# Patient Record
Sex: Female | Born: 1935 | Race: White | Hispanic: No | Smoking: Never smoker
Health system: Southern US, Community
[De-identification: ages and names within clinical notes are randomized; demographics above are authoritative.]

## PROBLEM LIST (undated history)

## (undated) DIAGNOSIS — Z932 Ileostomy status: Secondary | ICD-10-CM

## (undated) DIAGNOSIS — K219 Gastro-esophageal reflux disease without esophagitis: Secondary | ICD-10-CM

## (undated) DIAGNOSIS — I10 Essential (primary) hypertension: Secondary | ICD-10-CM

## (undated) DIAGNOSIS — C801 Malignant (primary) neoplasm, unspecified: Secondary | ICD-10-CM

## (undated) DIAGNOSIS — E213 Hyperparathyroidism, unspecified: Secondary | ICD-10-CM

## (undated) DIAGNOSIS — G510 Bell's palsy: Secondary | ICD-10-CM

## (undated) DIAGNOSIS — D649 Anemia, unspecified: Secondary | ICD-10-CM

## (undated) DIAGNOSIS — J189 Pneumonia, unspecified organism: Secondary | ICD-10-CM

## (undated) DIAGNOSIS — F419 Anxiety disorder, unspecified: Secondary | ICD-10-CM

## (undated) DIAGNOSIS — M810 Age-related osteoporosis without current pathological fracture: Secondary | ICD-10-CM

## (undated) HISTORY — PX: ABDOMINAL HYSTERECTOMY: SHX81

## (undated) HISTORY — PX: OTHER SURGICAL HISTORY: SHX169

## (undated) HISTORY — PX: BREAST EXCISIONAL BIOPSY: SUR124

---

## 1998-04-02 ENCOUNTER — Ambulatory Visit (HOSPITAL_COMMUNITY): Admission: RE | Admit: 1998-04-02 | Discharge: 1998-04-02 | Payer: Self-pay | Admitting: Family Medicine

## 1998-10-14 ENCOUNTER — Other Ambulatory Visit: Admission: RE | Admit: 1998-10-14 | Discharge: 1998-10-14 | Payer: Self-pay | Admitting: Obstetrics and Gynecology

## 1999-03-06 ENCOUNTER — Encounter: Admission: RE | Admit: 1999-03-06 | Discharge: 1999-03-06 | Payer: Self-pay | Admitting: *Deleted

## 1999-03-06 ENCOUNTER — Encounter: Payer: Self-pay | Admitting: *Deleted

## 1999-03-25 ENCOUNTER — Encounter: Admission: RE | Admit: 1999-03-25 | Discharge: 1999-03-25 | Payer: Self-pay | Admitting: *Deleted

## 1999-03-25 ENCOUNTER — Encounter: Payer: Self-pay | Admitting: *Deleted

## 1999-04-29 ENCOUNTER — Ambulatory Visit (HOSPITAL_COMMUNITY): Admission: RE | Admit: 1999-04-29 | Discharge: 1999-04-29 | Payer: Self-pay | Admitting: *Deleted

## 2000-05-24 ENCOUNTER — Encounter: Payer: Self-pay | Admitting: Obstetrics and Gynecology

## 2000-05-24 ENCOUNTER — Ambulatory Visit (HOSPITAL_COMMUNITY): Admission: RE | Admit: 2000-05-24 | Discharge: 2000-05-24 | Payer: Self-pay | Admitting: Obstetrics and Gynecology

## 2000-09-29 ENCOUNTER — Encounter (INDEPENDENT_AMBULATORY_CARE_PROVIDER_SITE_OTHER): Payer: Self-pay | Admitting: Specialist

## 2000-09-29 ENCOUNTER — Other Ambulatory Visit: Admission: RE | Admit: 2000-09-29 | Discharge: 2000-09-29 | Payer: Self-pay | Admitting: Gastroenterology

## 2001-06-22 ENCOUNTER — Encounter: Payer: Self-pay | Admitting: Obstetrics and Gynecology

## 2001-06-22 ENCOUNTER — Ambulatory Visit (HOSPITAL_COMMUNITY): Admission: RE | Admit: 2001-06-22 | Discharge: 2001-06-22 | Payer: Self-pay | Admitting: Obstetrics and Gynecology

## 2002-06-22 ENCOUNTER — Ambulatory Visit (HOSPITAL_COMMUNITY): Admission: RE | Admit: 2002-06-22 | Discharge: 2002-06-22 | Payer: Self-pay | Admitting: *Deleted

## 2002-06-22 ENCOUNTER — Encounter: Payer: Self-pay | Admitting: Obstetrics and Gynecology

## 2003-07-15 ENCOUNTER — Ambulatory Visit (HOSPITAL_COMMUNITY): Admission: RE | Admit: 2003-07-15 | Discharge: 2003-07-15 | Payer: Self-pay | Admitting: Geriatric Medicine

## 2004-02-13 ENCOUNTER — Ambulatory Visit: Payer: Self-pay | Admitting: Gastroenterology

## 2004-07-30 ENCOUNTER — Ambulatory Visit (HOSPITAL_COMMUNITY): Admission: RE | Admit: 2004-07-30 | Discharge: 2004-07-30 | Payer: Self-pay | Admitting: Geriatric Medicine

## 2004-07-30 ENCOUNTER — Ambulatory Visit: Payer: Self-pay | Admitting: Gastroenterology

## 2004-08-27 ENCOUNTER — Ambulatory Visit: Payer: Self-pay | Admitting: Gastroenterology

## 2004-09-25 ENCOUNTER — Inpatient Hospital Stay (HOSPITAL_COMMUNITY): Admission: EM | Admit: 2004-09-25 | Discharge: 2004-10-05 | Payer: Self-pay | Admitting: Emergency Medicine

## 2004-09-26 ENCOUNTER — Ambulatory Visit: Payer: Self-pay | Admitting: Gastroenterology

## 2004-10-27 ENCOUNTER — Ambulatory Visit: Payer: Self-pay | Admitting: Gastroenterology

## 2004-12-23 ENCOUNTER — Encounter (INDEPENDENT_AMBULATORY_CARE_PROVIDER_SITE_OTHER): Payer: Self-pay | Admitting: *Deleted

## 2004-12-23 ENCOUNTER — Ambulatory Visit: Payer: Self-pay | Admitting: Gastroenterology

## 2004-12-29 ENCOUNTER — Ambulatory Visit: Payer: Self-pay | Admitting: Gastroenterology

## 2005-01-07 ENCOUNTER — Encounter: Admission: RE | Admit: 2005-01-07 | Discharge: 2005-01-07 | Payer: Self-pay | Admitting: General Surgery

## 2005-01-21 ENCOUNTER — Ambulatory Visit (HOSPITAL_COMMUNITY): Admission: RE | Admit: 2005-01-21 | Discharge: 2005-01-21 | Payer: Self-pay | Admitting: General Surgery

## 2005-01-25 DIAGNOSIS — C801 Malignant (primary) neoplasm, unspecified: Secondary | ICD-10-CM

## 2005-01-25 HISTORY — DX: Malignant (primary) neoplasm, unspecified: C80.1

## 2005-01-25 HISTORY — PX: COLON SURGERY: SHX602

## 2005-02-12 ENCOUNTER — Encounter (INDEPENDENT_AMBULATORY_CARE_PROVIDER_SITE_OTHER): Payer: Self-pay | Admitting: Specialist

## 2005-02-12 ENCOUNTER — Inpatient Hospital Stay (HOSPITAL_COMMUNITY): Admission: RE | Admit: 2005-02-12 | Discharge: 2005-03-03 | Payer: Self-pay | Admitting: Surgery

## 2005-05-04 ENCOUNTER — Ambulatory Visit: Payer: Self-pay | Admitting: Hematology and Oncology

## 2005-05-11 LAB — COMPREHENSIVE METABOLIC PANEL
BUN: 27 mg/dL — ABNORMAL HIGH (ref 6–23)
CO2: 29 mEq/L (ref 19–32)
Creatinine, Ser: 0.9 mg/dL (ref 0.4–1.2)
Glucose, Bld: 96 mg/dL (ref 70–99)
Sodium: 140 mEq/L (ref 135–145)
Total Bilirubin: 0.5 mg/dL (ref 0.3–1.2)
Total Protein: 7.6 g/dL (ref 6.0–8.3)

## 2005-05-11 LAB — CBC WITH DIFFERENTIAL/PLATELET
Basophils Absolute: 0.1 10*3/uL (ref 0.0–0.1)
Eosinophils Absolute: 0.4 10*3/uL (ref 0.0–0.5)
HCT: 37 % (ref 34.8–46.6)
HGB: 12.2 g/dL (ref 11.6–15.9)
LYMPH%: 27.9 % (ref 14.0–48.0)
MONO#: 0.4 10*3/uL (ref 0.1–0.9)
NEUT#: 4.1 10*3/uL (ref 1.5–6.5)
NEUT%: 58.6 % (ref 39.6–76.8)
Platelets: 316 10*3/uL (ref 145–400)
WBC: 6.9 10*3/uL (ref 3.9–10.0)

## 2005-05-11 LAB — CEA: CEA: <0.5 ng/mL (ref 0.0–5.0)

## 2005-08-03 ENCOUNTER — Ambulatory Visit (HOSPITAL_COMMUNITY): Admission: RE | Admit: 2005-08-03 | Discharge: 2005-08-03 | Payer: Self-pay | Admitting: Geriatric Medicine

## 2005-08-05 ENCOUNTER — Ambulatory Visit: Payer: Self-pay | Admitting: Hematology and Oncology

## 2005-08-11 LAB — CBC WITH DIFFERENTIAL/PLATELET
Basophils Absolute: 0.1 10*3/uL (ref 0.0–0.1)
EOS%: 7.9 % — ABNORMAL HIGH (ref 0.0–7.0)
Eosinophils Absolute: 0.6 10*3/uL — ABNORMAL HIGH (ref 0.0–0.5)
LYMPH%: 25.3 % (ref 14.0–48.0)
MCH: 28.2 pg (ref 26.0–34.0)
MCV: 84.2 fL (ref 81.0–101.0)
MONO%: 6.6 % (ref 0.0–13.0)
NEUT#: 4.2 10*3/uL (ref 1.5–6.5)
Platelets: 300 10*3/uL (ref 145–400)
RBC: 4.46 10*6/uL (ref 3.70–5.32)

## 2005-08-11 LAB — CEA: CEA: <0.5 ng/mL (ref 0.0–5.0)

## 2005-08-11 LAB — COMPREHENSIVE METABOLIC PANEL
CO2: 29 mEq/L (ref 19–32)
Creatinine, Ser: 0.91 mg/dL (ref 0.40–1.20)
Glucose, Bld: 75 mg/dL (ref 70–99)
Total Bilirubin: 0.5 mg/dL (ref 0.3–1.2)

## 2005-12-08 ENCOUNTER — Ambulatory Visit: Payer: Self-pay | Admitting: Hematology and Oncology

## 2005-12-10 LAB — CBC WITH DIFFERENTIAL/PLATELET
Basophils Absolute: 0.1 10*3/uL (ref 0.0–0.1)
Eosinophils Absolute: 0.5 10*3/uL (ref 0.0–0.5)
HCT: 37.2 % (ref 34.8–46.6)
HGB: 12.3 g/dL (ref 11.6–15.9)
LYMPH%: 33.7 % (ref 14.0–48.0)
MCV: 86 fL (ref 81.0–101.0)
MONO#: 0.4 10*3/uL (ref 0.1–0.9)
NEUT#: 3.3 10*3/uL (ref 1.5–6.5)
Platelets: 281 10*3/uL (ref 145–400)
RBC: 4.32 10*6/uL (ref 3.70–5.32)
WBC: 6.5 10*3/uL (ref 3.9–10.0)

## 2005-12-10 LAB — COMPREHENSIVE METABOLIC PANEL
Albumin: 4.1 g/dL (ref 3.5–5.2)
BUN: 25 mg/dL — ABNORMAL HIGH (ref 6–23)
CO2: 29 mEq/L (ref 19–32)
Glucose, Bld: 88 mg/dL (ref 70–99)
Sodium: 140 mEq/L (ref 135–145)
Total Bilirubin: 0.4 mg/dL (ref 0.3–1.2)
Total Protein: 7.4 g/dL (ref 6.0–8.3)

## 2005-12-10 LAB — CEA: CEA: <0.5 ng/mL (ref 0.0–5.0)

## 2006-01-14 ENCOUNTER — Ambulatory Visit: Payer: Self-pay | Admitting: Gastroenterology

## 2006-08-05 ENCOUNTER — Ambulatory Visit (HOSPITAL_COMMUNITY): Admission: RE | Admit: 2006-08-05 | Discharge: 2006-08-05 | Payer: Self-pay | Admitting: Geriatric Medicine

## 2006-08-08 ENCOUNTER — Ambulatory Visit: Payer: Self-pay | Admitting: Hematology and Oncology

## 2006-08-10 LAB — CBC WITH DIFFERENTIAL/PLATELET
Basophils Absolute: 0.1 10*3/uL (ref 0.0–0.1)
Eosinophils Absolute: 0.5 10*3/uL (ref 0.0–0.5)
HGB: 12.5 g/dL (ref 11.6–15.9)
LYMPH%: 29.7 % (ref 14.0–48.0)
MCV: 84.8 fL (ref 81.0–101.0)
MONO#: 0.5 10*3/uL (ref 0.1–0.9)
MONO%: 6.5 % (ref 0.0–13.0)
NEUT#: 4.5 10*3/uL (ref 1.5–6.5)
Platelets: 239 10*3/uL (ref 145–400)
RBC: 4.36 10*6/uL (ref 3.70–5.32)
RDW: 13.9 % (ref 11.3–14.5)
WBC: 7.9 10*3/uL (ref 3.9–10.0)

## 2006-08-10 LAB — COMPREHENSIVE METABOLIC PANEL
Albumin: 4.1 g/dL (ref 3.5–5.2)
BUN: 17 mg/dL (ref 6–23)
CO2: 26 mEq/L (ref 19–32)
Glucose, Bld: 90 mg/dL (ref 70–99)
Potassium: 4.6 mEq/L (ref 3.5–5.3)
Sodium: 141 mEq/L (ref 135–145)
Total Protein: 7.3 g/dL (ref 6.0–8.3)

## 2006-08-10 LAB — CEA: CEA: 0.6 ng/mL (ref 0.0–5.0)

## 2007-03-31 ENCOUNTER — Ambulatory Visit: Payer: Self-pay | Admitting: Hematology and Oncology

## 2007-04-04 ENCOUNTER — Ambulatory Visit (HOSPITAL_COMMUNITY): Admission: RE | Admit: 2007-04-04 | Discharge: 2007-04-04 | Payer: Self-pay | Admitting: Hematology and Oncology

## 2007-04-04 LAB — COMPREHENSIVE METABOLIC PANEL
ALT: 23 U/L (ref 0–35)
AST: 23 U/L (ref 0–37)
CO2: 22 mEq/L (ref 19–32)
Sodium: 139 mEq/L (ref 135–145)
Total Bilirubin: 0.5 mg/dL (ref 0.3–1.2)
Total Protein: 7.5 g/dL (ref 6.0–8.3)

## 2007-04-04 LAB — CBC WITH DIFFERENTIAL/PLATELET
BASO%: 0.6 % (ref 0.0–2.0)
LYMPH%: 30.7 % (ref 14.0–48.0)
MCHC: 33.8 g/dL (ref 32.0–36.0)
MONO#: 0.4 10*3/uL (ref 0.1–0.9)
Platelets: 245 10*3/uL (ref 145–400)
RBC: 4.49 10*6/uL (ref 3.70–5.32)
WBC: 7.7 10*3/uL (ref 3.9–10.0)
lymph#: 2.4 10*3/uL (ref 0.9–3.3)

## 2007-04-11 ENCOUNTER — Encounter: Payer: Self-pay | Admitting: Pulmonary Disease

## 2007-04-11 ENCOUNTER — Ambulatory Visit (HOSPITAL_COMMUNITY): Admission: RE | Admit: 2007-04-11 | Discharge: 2007-04-11 | Payer: Self-pay | Admitting: Hematology and Oncology

## 2007-04-17 DIAGNOSIS — K5190 Ulcerative colitis, unspecified, without complications: Secondary | ICD-10-CM | POA: Insufficient documentation

## 2007-04-17 DIAGNOSIS — F411 Generalized anxiety disorder: Secondary | ICD-10-CM | POA: Insufficient documentation

## 2007-04-17 DIAGNOSIS — I10 Essential (primary) hypertension: Secondary | ICD-10-CM | POA: Insufficient documentation

## 2007-04-17 DIAGNOSIS — C20 Malignant neoplasm of rectum: Secondary | ICD-10-CM | POA: Insufficient documentation

## 2007-04-18 ENCOUNTER — Ambulatory Visit: Payer: Self-pay | Admitting: Pulmonary Disease

## 2007-04-18 DIAGNOSIS — J984 Other disorders of lung: Secondary | ICD-10-CM | POA: Insufficient documentation

## 2007-04-18 DIAGNOSIS — J309 Allergic rhinitis, unspecified: Secondary | ICD-10-CM | POA: Insufficient documentation

## 2007-04-18 DIAGNOSIS — E785 Hyperlipidemia, unspecified: Secondary | ICD-10-CM | POA: Insufficient documentation

## 2007-07-12 ENCOUNTER — Ambulatory Visit: Payer: Self-pay | Admitting: Internal Medicine

## 2007-08-07 IMAGING — PT NM PET TUM IMG SKULL BASE T - THIGH
4 series · 25 of 25 positions shown · non-contrast
Comparison: CT dated 01/07/05.

CLINICAL DATA: Colon cancer.  Abnormal CT lung nodules. 
FDG PET-CT TUMOR IMAGING (SKULL BASE TO THIGHS):
Fasting Blood Glucose:  94.
TECHNIQUE: 17 mCi F-18 FDG were administered via right antecubital fossa.  Full ring PET imaging was performed from the skull base through the mid-thighs 70 minutes after injection.  CT data was obtained and used for attenuation correction and anatomic localization only.  (This was not acquired as a diagnostic CT examination.)

[Series 1: pet ac · axial · 3.3mm · 4.69mm/px · z∈[-866,+4]mm · 8 of 267 slices shown]
[im 1/267]
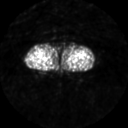
[im 39/267]
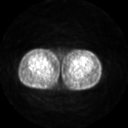
[im 77/267]
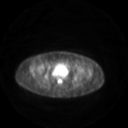
[im 115/267]
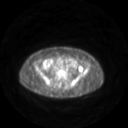
[im 153/267]
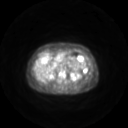
[im 191/267]
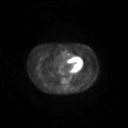
[im 229/267]
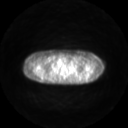
[im 267/267]
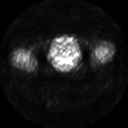

[Series 2: ct images · axial · 3.8mm · 0.98mm/px · z∈[-866,+4]mm · 8 of 267 slices shown]
[im 1/267]
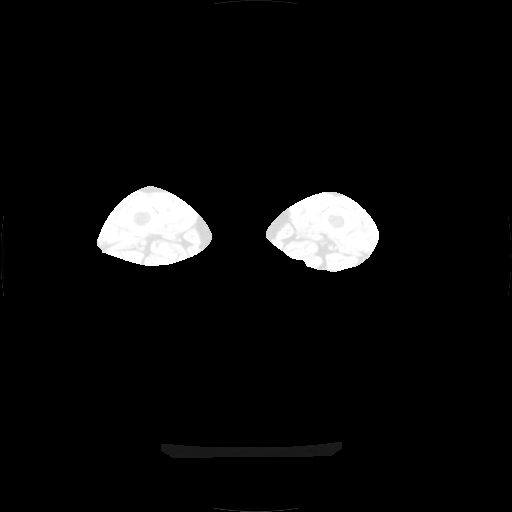
[im 39/267]
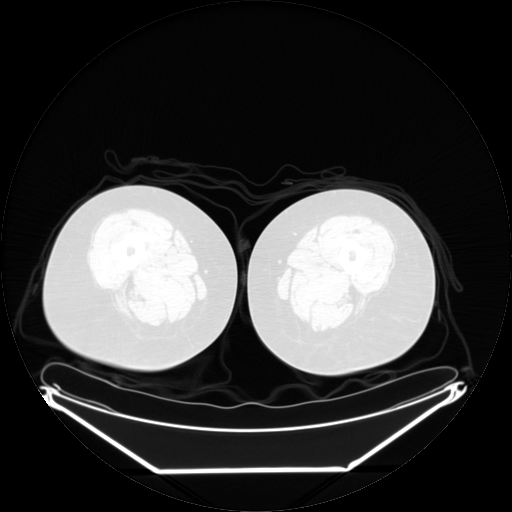
[im 77/267]
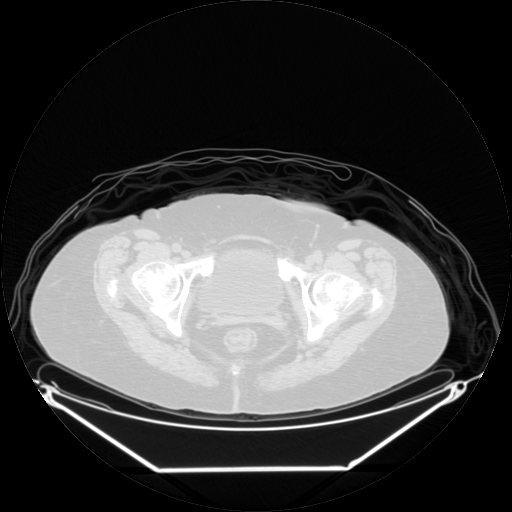
[im 115/267]
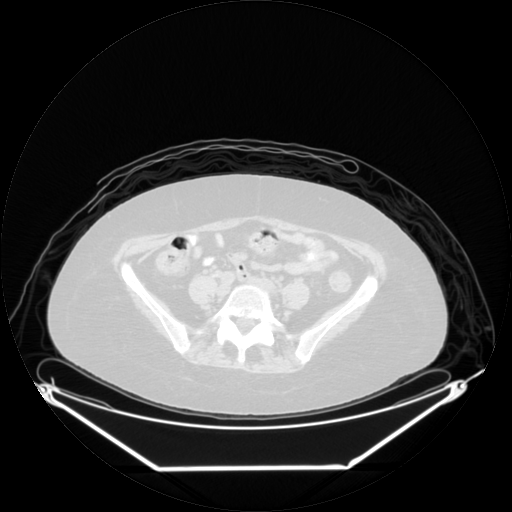
[im 153/267]
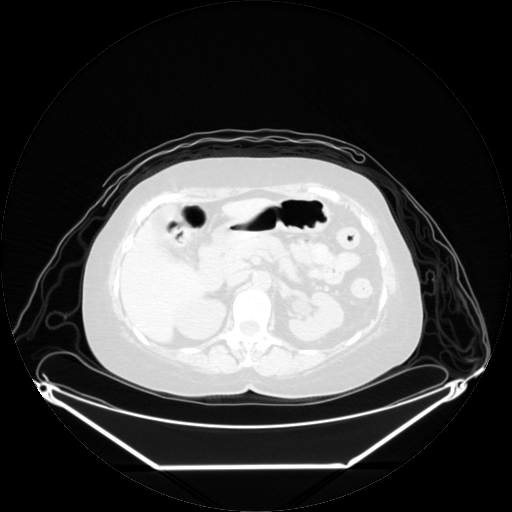
[im 191/267]
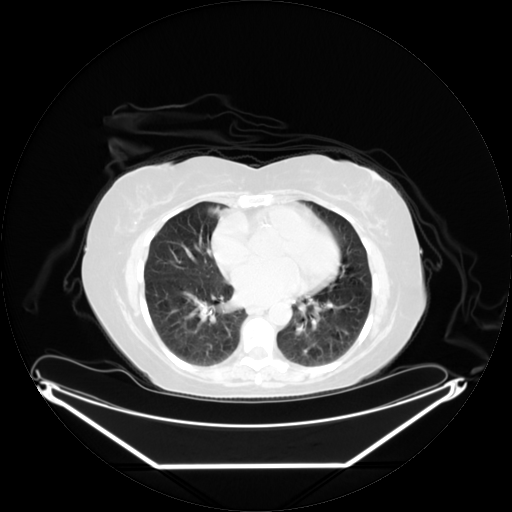
[im 229/267]
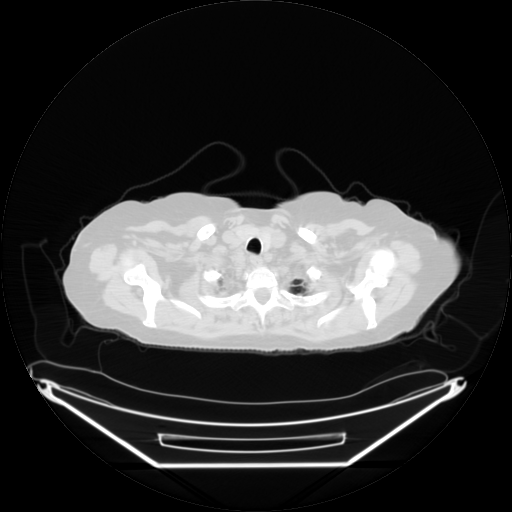
[im 267/267]
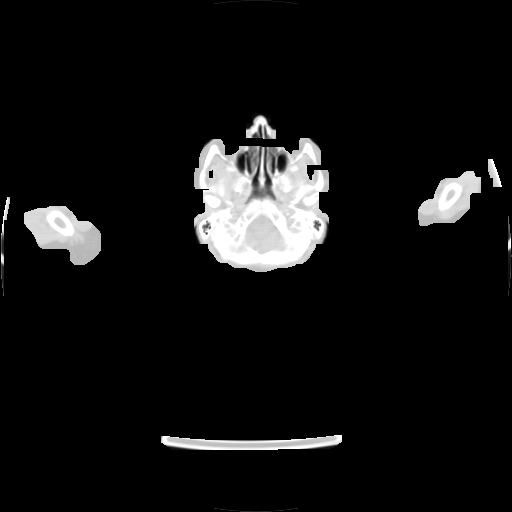

[Series 2: pet nac · axial · 3.3mm · 4.69mm/px · z∈[-866,+4]mm · 8 of 267 slices shown]
[im 1/267]
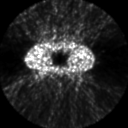
[im 39/267]
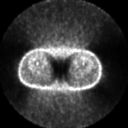
[im 77/267]
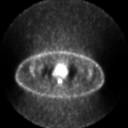
[im 115/267]
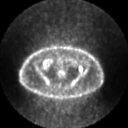
[im 153/267]
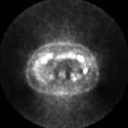
[im 191/267]
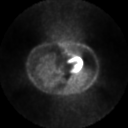
[im 229/267]
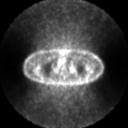
[im 267/267]
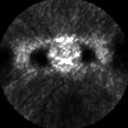

[Series 123: mip · coronal · 3.3mm · 4.69mm/px · 1 of 30 slices shown]
[im 1/30]
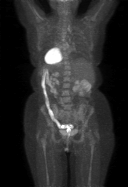

[25 of 25 positions shown; findings below may reference images not displayed]

FINDINGS: No hypermetabolic lymph nodes are identified within the soft tissues of the neck. 
Just below the thoracic inlet, there is an enlarged right paratracheal lymph node which demonstrates moderate increased FDG uptake.  This lymph node measures approximately 1.8 x 1.5 cm (image #39).  The SUV maximum is equal to 4.8 g/mL.  
Within the thoracic esophagus, there is focal increased FDG uptake at the level of the carina which may indicate esophagitis.  There is mild to moderate increased FDG uptake within the subcarinal lymph node with an SUV maximum equal to 4.5 g/mL.  
Mild nonspecific uptake is identified within the right hilar region.
Enlarged right paratracheal lymph node demonstrates no significant FDG uptake.  
Multiple pulmonary nodules are identified within the right upper lobe, right lower lobe, and right middle lobe.  These nodules all measure less than 1 cm and are too small to reliably characterize by PET.  The largest measures 4.1 x 6.3 mm and has an SUV maximum equal to 1.8 g/mL.  This degree of FDG uptake is nonspecific.  Given the small size of these nodules, these findings may be the sequelae of inflammation, infection, or metastasis.  Continued follow-up imaging of these nodules is recommended.  
Calcified nodule within the left upper lobe is noted. 
The liver demonstrates no abnormal foci of increased radiotracer uptake concerning for hypermetabolic metastasis.  
No abnormal uptake with the splenic parenchyma.  
  The adrenal glands are negative.  No abnormal uptake is seen within the kidneys.  
  No hypermetabolic retroperitoneal or mesenteric lymph nodes are identified.  
No hypermetabolic pelvic lymphadenopathy. 
No abnormal uptake is seen within the axial or appendicular skeleton.
IMPRESSION: 1.  There is moderate increased uptake within two enlarged right paratracheal lymph nodes at the level of the thoracic inlet.  Additionally, there is mild to moderate increased uptake within subcarinal and right hilar lymph nodes.  In the setting of recent pneumonia, these findings are nonspecific.  I cannot exclude underlying tumor metastasis, however.  Follow-up imaging of these lymph nodes is recommended to ensure complete resolution. 
2.  Multiple pulmonary nodules within both lungs.  These nodules are too small to reliably characterize by PET.  The largest of these nodules is within the right upper lobe and demonstrates increased FDG uptake with an SUV maximum equal to 1.8 g/mL.  Again, continued interval follow-up is recommended to exclude the presence of metastasis.
3.  No abnormal uptake is seen within the abdomen or pelvis.

## 2007-08-08 ENCOUNTER — Ambulatory Visit (HOSPITAL_COMMUNITY): Admission: RE | Admit: 2007-08-08 | Discharge: 2007-08-08 | Payer: Self-pay | Admitting: Geriatric Medicine

## 2008-01-22 ENCOUNTER — Encounter: Payer: Self-pay | Admitting: Pulmonary Disease

## 2008-01-25 ENCOUNTER — Ambulatory Visit: Payer: Self-pay | Admitting: Internal Medicine

## 2008-03-25 ENCOUNTER — Ambulatory Visit: Payer: Self-pay | Admitting: Hematology and Oncology

## 2008-03-27 LAB — CBC WITH DIFFERENTIAL/PLATELET
Eosinophils Absolute: 0.6 10*3/uL — ABNORMAL HIGH (ref 0.0–0.5)
MONO#: 0.5 10*3/uL (ref 0.1–0.9)
NEUT#: 3.9 10*3/uL (ref 1.5–6.5)
Platelets: 275 10*3/uL (ref 145–400)
RBC: 4.34 10*6/uL (ref 3.70–5.45)
RDW: 13.4 % (ref 11.2–14.5)
WBC: 7.1 10*3/uL (ref 3.9–10.3)

## 2008-03-27 LAB — CEA: CEA: 0.8 ng/mL (ref 0.0–5.0)

## 2008-03-27 LAB — COMPREHENSIVE METABOLIC PANEL
Albumin: 4.3 g/dL (ref 3.5–5.2)
CO2: 30 mEq/L (ref 19–32)
Glucose, Bld: 91 mg/dL (ref 70–99)
Potassium: 4.6 mEq/L (ref 3.5–5.3)
Sodium: 138 mEq/L (ref 135–145)
Total Protein: 7.5 g/dL (ref 6.0–8.3)

## 2008-03-29 ENCOUNTER — Ambulatory Visit (HOSPITAL_COMMUNITY): Admission: RE | Admit: 2008-03-29 | Discharge: 2008-03-29 | Payer: Self-pay | Admitting: Hematology and Oncology

## 2008-04-03 ENCOUNTER — Encounter: Payer: Self-pay | Admitting: Gastroenterology

## 2008-08-08 ENCOUNTER — Ambulatory Visit (HOSPITAL_COMMUNITY): Admission: RE | Admit: 2008-08-08 | Discharge: 2008-08-08 | Payer: Self-pay | Admitting: Geriatric Medicine

## 2008-08-15 ENCOUNTER — Encounter: Payer: Self-pay | Admitting: Pulmonary Disease

## 2008-08-16 ENCOUNTER — Telehealth: Payer: Self-pay | Admitting: Pulmonary Disease

## 2009-03-26 ENCOUNTER — Ambulatory Visit: Payer: Self-pay | Admitting: Hematology and Oncology

## 2009-03-28 LAB — CBC WITH DIFFERENTIAL/PLATELET
BASO%: 0.8 % (ref 0.0–2.0)
Basophils Absolute: 0.1 10*3/uL (ref 0.0–0.1)
EOS%: 8 % — ABNORMAL HIGH (ref 0.0–7.0)
HCT: 38.8 % (ref 34.8–46.6)
HGB: 12.9 g/dL (ref 11.6–15.9)
LYMPH%: 24.7 % (ref 14.0–49.7)
MCH: 29.7 pg (ref 25.1–34.0)
MCHC: 33.4 g/dL (ref 31.5–36.0)
MCV: 88.9 fL (ref 79.5–101.0)
MONO%: 4.6 % (ref 0.0–14.0)
NEUT%: 61.9 % (ref 38.4–76.8)

## 2009-03-28 LAB — COMPREHENSIVE METABOLIC PANEL
AST: 28 U/L (ref 0–37)
Alkaline Phosphatase: 71 U/L (ref 39–117)
BUN: 22 mg/dL (ref 6–23)
Calcium: 10.4 mg/dL (ref 8.4–10.5)
Creatinine, Ser: 0.8 mg/dL (ref 0.40–1.20)
Total Bilirubin: 0.5 mg/dL (ref 0.3–1.2)

## 2009-03-31 ENCOUNTER — Ambulatory Visit (HOSPITAL_COMMUNITY): Admission: RE | Admit: 2009-03-31 | Discharge: 2009-03-31 | Payer: Self-pay | Admitting: Hematology and Oncology

## 2009-04-03 ENCOUNTER — Encounter: Payer: Self-pay | Admitting: Gastroenterology

## 2009-08-11 ENCOUNTER — Ambulatory Visit (HOSPITAL_COMMUNITY): Admission: RE | Admit: 2009-08-11 | Discharge: 2009-08-11 | Payer: Self-pay | Admitting: Geriatric Medicine

## 2010-02-13 ENCOUNTER — Other Ambulatory Visit: Payer: Self-pay | Admitting: Hematology and Oncology

## 2010-02-13 DIAGNOSIS — C20 Malignant neoplasm of rectum: Secondary | ICD-10-CM

## 2010-02-15 ENCOUNTER — Encounter: Payer: Self-pay | Admitting: Hematology and Oncology

## 2010-02-26 NOTE — Letter (Signed)
[facility]   Imported By: Sherian Rein 04/18/2009 08:09:49  _____________________________________________________________________  External Attachment:    Type:   Image     Comment:   External Document

## 2010-03-26 ENCOUNTER — Other Ambulatory Visit: Payer: Self-pay | Admitting: Hematology and Oncology

## 2010-03-26 ENCOUNTER — Other Ambulatory Visit (HOSPITAL_COMMUNITY): Payer: Self-pay

## 2010-03-26 ENCOUNTER — Encounter (HOSPITAL_COMMUNITY): Payer: Self-pay

## 2010-03-26 ENCOUNTER — Encounter (HOSPITAL_BASED_OUTPATIENT_CLINIC_OR_DEPARTMENT_OTHER): Payer: Medicare Other | Admitting: Hematology and Oncology

## 2010-03-26 ENCOUNTER — Ambulatory Visit (HOSPITAL_COMMUNITY)
Admission: RE | Admit: 2010-03-26 | Discharge: 2010-03-26 | Disposition: A | Discharge status: Home / Self Care | Payer: Medicare Other | Source: Ambulatory Visit | Attending: Hematology and Oncology | Admitting: Hematology and Oncology

## 2010-03-26 DIAGNOSIS — C20 Malignant neoplasm of rectum: Secondary | ICD-10-CM

## 2010-03-26 DIAGNOSIS — J984 Other disorders of lung: Secondary | ICD-10-CM | POA: Insufficient documentation

## 2010-03-26 DIAGNOSIS — Z932 Ileostomy status: Secondary | ICD-10-CM | POA: Insufficient documentation

## 2010-03-26 DIAGNOSIS — K7689 Other specified diseases of liver: Secondary | ICD-10-CM | POA: Insufficient documentation

## 2010-03-26 DIAGNOSIS — Q619 Cystic kidney disease, unspecified: Secondary | ICD-10-CM | POA: Insufficient documentation

## 2010-03-26 DIAGNOSIS — Z90710 Acquired absence of both cervix and uterus: Secondary | ICD-10-CM | POA: Insufficient documentation

## 2010-03-26 HISTORY — DX: Malignant (primary) neoplasm, unspecified: C80.1

## 2010-03-26 LAB — CMP (CANCER CENTER ONLY)
Albumin: 3.4 g/dL (ref 3.3–5.5)
Alkaline Phosphatase: 70 U/L (ref 26–84)
BUN, Bld: 19 mg/dL (ref 7–22)
Calcium: 10.3 mg/dL (ref 8.0–10.3)
Glucose, Bld: 99 mg/dL (ref 73–118)
Potassium: 4.5 mEq/L (ref 3.3–4.7)

## 2010-03-26 LAB — CBC WITH DIFFERENTIAL/PLATELET
Basophils Absolute: 0.1 10*3/uL (ref 0.0–0.1)
Eosinophils Absolute: 0.6 10*3/uL — ABNORMAL HIGH (ref 0.0–0.5)
HCT: 36.5 % (ref 34.8–46.6)
HGB: 12.2 g/dL (ref 11.6–15.9)
MCV: 87.8 fL (ref 79.5–101.0)
MONO%: 4.6 % (ref 0.0–14.0)
NEUT#: 5 10*3/uL (ref 1.5–6.5)
NEUT%: 64.3 % (ref 38.4–76.8)
Platelets: 271 10*3/uL (ref 145–400)
RDW: 14 % (ref 11.2–14.5)

## 2010-03-26 MED ORDER — IOHEXOL 300 MG/ML  SOLN
100.0000 mL | Freq: Once | INTRAMUSCULAR | Status: AC | PRN
  Administered 2010-03-26: 100 mL via INTRAVENOUS

## 2010-04-02 ENCOUNTER — Encounter (HOSPITAL_BASED_OUTPATIENT_CLINIC_OR_DEPARTMENT_OTHER): Payer: Medicare Other | Admitting: Hematology and Oncology

## 2010-04-02 DIAGNOSIS — C20 Malignant neoplasm of rectum: Secondary | ICD-10-CM

## 2010-04-14 ENCOUNTER — Other Ambulatory Visit: Payer: Self-pay | Admitting: Hematology and Oncology

## 2010-04-14 DIAGNOSIS — C20 Malignant neoplasm of rectum: Secondary | ICD-10-CM

## 2010-06-12 NOTE — Discharge Summary (Signed)
Sandy Trujillo, BOSSO             ACCOUNT NO.:  [id number]   MEDICAL RECORD NO.:  [id number]          PATIENT TYPE:  INP   LOCATION:  5708                         FACILITY:  [facility]   PHYSICIAN:  Maisie Fus A. Cornett, M.D.DATE OF BIRTH:  10-12-1932   DATE OF ADMISSION:  02/12/2005  DATE OF DISCHARGE:  03/03/2005                                 DISCHARGE SUMMARY   ADMISSION DIAGNOSES:  1.  Ulcerative colitis.  2.  Rectal carcinoma, T1, N0, Mx.  3.  Hypertension.  4.  Anxiety.   DISCHARGE DIAGNOSES:  1.  Ulcerative colitis.  2.  Rectal carcinoma, T1, N0, Mx.  3.  Hypertension.  4.  Anxiety.   PROCEDURES PERFORMED:  1.  Total proctocolectomy with end-ileostomy.  2.  Placement of percutaneous drain for postoperative fluid collection by      interventional radiology.  3.  Placement of peripherally-inserted central catheter line.   BRIEF HISTORY:  The patient is a 77 year old female with a 10-year history  of ulcerative colitis.  She, unfortunately, developed a very low-lying  rectal carcinoma which was roughly 2-3 cm above the dentate line.  She was  not a candidate for ileoanal pull-through and required a total  proctocolectomy for control of the disease with end-ileostomy.  She was  admitted on February 12, 2005, for this after a bowel prep.   HOSPITAL COURSE:  After undergoing a total proctocolectomy on February 12, 2005, the patient was admitted to the hospital.  Please see operative note  for details of the procedure.  On postop day 1 she was moved from the PACU  to the floor in satisfactory condition.  She had a JP drain in her perineum,  which drained quite heavily for the next 10-12 days.  This eventually came  out at that point in time after the drainage slowed.  She also developed  postoperative anemia down to a hemoglobin of 7 and required transfusion for  this.  Her ileostomy output picked up slowly over this time course.  She  developed some right upper quadrant pain,  and CT scan revealed a right upper  quadrant fluid collection.  This was percutaneously drained.  Of note, her  gallbladder was removed during her operative procedure secondary to  gallstones.  This drained a green fluid.  She also had expression of this  green fluid through her lower midline incision.  This was opened during the  hospital course and packed.  Concern for was for enterocutaneous fistula,  but the drainage stopped and this was clear after placement of the catheter.  Cultures of her urine grew out a coagulase-negative Staphylococcus that was  treated with vancomycin due to resistance to other antibiotics.  She also  was switched to Zosyn as well for better enteric coverage.  She had a white  count as high as 35,000 during her hospitalization.  This was prior to  drainage of this fluid collection through her wound and percutaneously.  Once this drained, her white count returned to around 11,000 to 12,000.  Her  hemoglobin remained low in the 8-9 range, but she required no further  transfusions for that.  Her diet was switched to a post colectomy diet, and  she was able to maintain herself with adequate p.o. intake with good urine  output and ileostomy output less than a liter or so.  Her perineal wound was  open and draining, but the sutures were intact and she had some excoriation  but no signs of invasive infection in this region during her hospital stay.  Of note, she did develop thrombocytosis.  The etiology was unclear.  Her  platelet count was above 900,000.  At that point in time I placed her on an  aspirin a day and will have to have that followed up later once she is out  of the hospital.  This may be secondary to stress or medications and less  likely a bone marrow disorder, since she had none prior to this.  Home  health was arranged for discharge for wound care as well as flushing her  percutaneous drain, which she will go home on.  She will follow up with me  in  four to five days so I can check on her drain as well as her wounds and  see if we can take the drain out at that point in time.  Repeat CT scan at  the drain placement revealed a small residual fluid collection but no  significant change, no significant enlargement of the one that was drained.  At discharge her electrolytes were within normal range with a low potassium  of 3.3.  I gave her Gatorade during the hospital course and asked her to  take that when she got home as well.  She was discharged home on March 03, 2005, in satisfactory condition at this point in time.   DISCHARGE INSTRUCTIONS:  She will follow up with me on Monday in the office.   Her medications at discharge will be as follows:  1.  Atenolol 50 mg a day.  2.  Reglan 10 mg q.6h. p.r.n. for nausea and vomiting.  3.  She will be back on her Xanax 0.25 mg t.i.d.  4.  She will be on Augmentin 875 mg p.o. b.i.d.  5.  She will be on Percocet one to two tablets p.o. q.4h. p.r.n. pain.  6.  I have started her on an aspirin 325 mg enteric-coated p.o. daily.   Home health has been arranged for her to help with wound care and ostomy  care and flushing her percutaneous drain.  I answered all of her questions  to the best of my ability with her daughter present.  She is excited about  going home.   CONDITION ON DISCHARGE:  Satisfactory.      Thomas A. Cornett, M.D.  Electronically Signed     TAC/MEDQ  D:  03/03/2005  T:  03/03/2005  Job:  [id number]   cc:   Barbette Hair. Arlyce Dice, M.D. [facility]  [address]  [place]  [place] [address]

## 2010-06-12 NOTE — Assessment & Plan Note (Signed)
[facility]                         GASTROENTEROLOGY OFFICE NOTE   NAME:Culbreth, PEGGI YONO                    MRN:          [id number]  DATE:01/14/2006                            DOB:          [birth year]    PROBLEM:  Ulcerative colitis and colon cancer.   Ms. Onnie Graham has returned following surgery for a total colectomy.  A  rectal carcinoma was noted on routine colonoscopy.  Since her surgery 6  months ago, she has done extremely well.  She actually has no GI  complaints.   On exam, pulse 70, blood pressure 140/72, weight 156.   PHYSICAL EXAMINATION:  HEENT: EOMI. PERRLA. Sclerae are anicteric.  Conjunctivae are pink.  NECK:  Supple without thyromegaly, adenopathy or carotid bruits.  CHEST:  Clear to auscultation and percussion without adventitious  sounds.  CARDIAC:  Regular rhythm; normal S1 S2.  There are no murmurs, gallops  or rubs.  ABDOMEN:  Bowel sounds are normoactive.  Abdomen is soft, non-tender and  non-distended.  There are no abdominal masses, tenderness, splenic  enlargement or hepatomegaly.  EXTREMITIES:  Full range of motion.  No cyanosis, clubbing or edema.  RECTAL:  Deferred.   IMPRESSION:  Status post total colectomy, proctocolectomy for colon  cancer, complicating ulcerative colitis.   RECOMMENDATIONS:  No specific GI surveillance is required at this time.     Barbette Hair. Arlyce Dice, MD,FACG  Electronically Signed    RDK/MedQ  DD: 01/14/2006  DT: 01/15/2006  Job #: [id number]   cc:   Vicente Serene I. Odogwu, M.D.

## 2010-06-12 NOTE — Op Note (Signed)
NAMESABEEN, Sandy Trujillo             ACCOUNT NO.:  [id number]   MEDICAL RECORD NO.:  [id number]          PATIENT TYPE:  INP   LOCATION:  5715                         FACILITY:  [facility]   PHYSICIAN:  Maisie Fus A. Cornett, M.D.DATE OF BIRTH:  [birth year]   DATE OF PROCEDURE:  02/12/2005  DATE OF DISCHARGE:                                 OPERATIVE REPORT   PREOPERATIVE DIAGNOSES:  1.  Ulcerative colitis.  2.  Rectal adenocarcinoma/  3.  Cholelithiasis.   POSTOPERATIVE DIAGNOSES:  1.  Ulcerative colitis.  2.  Rectal adenocarcinoma/  3.  Cholelithiasis.   PROCEDURES:  1.  Total abdominal colectomy.  2.  Proctectomy.  3.  Cholecystectomy.  4.  Takedown the splenic flexure.   SURGEON:  Maisie Fus A. Cornett, M.D.   ASSISTANT:  Rose Phi. Maple Hudson, M.D.   ANESTHESIA:  General endotracheal anesthesia,   ESTIMATED BLOOD LOSS:  250 mL.   DRAINS:  One Jackson-Pratt in the pelvis.   URINE OUTPUT:  Approximately 200 mL.   SPECIMEN:  1.  Total abdominal colon and rectum, including anus, to pathology for      evaluation.  2.  Gallbladder with gallstones.   INDICATIONS FOR PROCEDURE:  The patient is a 77 year old female who has a  longstanding history of ulcer colitis, found to have a very low-lying rectal  carcinoma on colonoscopy.  A total abdominal colectomy with proctectomy and  end-ileostomy was recommended to her due to the fact that she ulcerative  colitis and a very low-lying rectal carcinoma.  It was going to be  impossible to any sort of pull-through procedure due to the low-lying nature  of her rectal carcinoma.  After discussion of this and the need for ostomy,  the patient voiced her understanding and agreed to proceed.  Cholecystectomy  is also recommended to her gallstones.   DESCRIPTION OF PROCEDURE:  The patient was brought to the operating room and  placed supine.  After induction of general endotracheal anesthesia, the  patient was placed in the lithotomy position.  The  abdomen and perineum were  then prepped and draped in the usual sterile fashion.  Care was taken to pad  all arms and legs and to put the legs in a position where there was not any  significant stress on the peroneal nerve.  Once all this was done, she  received preoperative antibiotics and a nasogastric tube was put in place.  A midline incision was then used from her xiphoid to her pubic symphysis.  Dissection was carried down and through her subcutaneous fat to her  abdominal wall, and this was opened in the midline.  Upon entering the  abdominal cavity, there were some minimal adhesions in her pelvis and these  were taken down using cautery and Metzenbaum scissors.  Once this was done,  I was able to use a Bookwalter retractor.  Once we did this, this gave [place]  great exposure.  I examined the entire colon down to the peritoneal  reflection.  There was no significant inflammatory change to the colon.  Next, the right colon was mobilized along the white line  of Toldt using the  electrocautery.  The hepatic flexure was also taken down using  electrocautery as well as a LigaSure device.  Once we did this, I chose to  divide the ileum at the junction of the ileocecal valve using the GIA75 75  stapler.  We then began to take the mesentery with a combination of LigaSure  as well as 2-0 silk ties for large vessels, including the right colics and  middle colic.  The entire mesentery was taken down.  We also continued to  mobilize the colon, going toward the splenic flexure.  We took the splenic  flexure down as well.  The mesentery of the colon was then controlled using  a combination of Vicryl ties as well as a LigaSure.  The entire colon taken  down along the white line of Toldt along the left side.  Once we did this, I  was able to mobilize the sigmoid colon and identified the ureter on the  left side.  The ureter on the right side was easily seen below the  peritoneal reflection.  At this  point we mobilized the sigmoid colon  completely and began to use the Harmonic scalpel to dissect posterior to the  sigmoid colon and then the skin to enter the presacral space using the  Harmonic scalpel.  This was taken all way down to the coccyx.  The lateral  stalks were also taken with the Harmonic scalpel as well with good  hemostasis.  The patient had a previous hysterectomy, and the vaginal cuff  was then grasped with an Allis clamp and I was able to dissect in between  the vaginal cuff and the anterior wall of the rectum.  Once I was able to  mobilize the colon circumferentially and then the rectum as far down as I  could, I felt that it was time to begin the dissection from the perineal  component.  Hemostasis was excellent.  Reinspection of the abdominal cavity  revealed no bleeding from the spleen from mobilization of splenic flexure.   Attention was turned the perineum.  I used a scalpel to excise an ellipse of  skin around the edge of the anoderm.  I then used Allis clamps to use  cautery to dissect circumferentially around the anus.  The posterior wall of  the vagina was very extremely thin, and this was entered at one point.  I  was able to find a very fibrotic plane between the two.  I was able to  dissect up on the anterior surface of the rectum with care taken not to  injure the rectum.  I was then able to enter the pelvic cavity, where I had  come down from the intra-abdominal portion of the case.  Once I was able to  do this, I dissected circumferentially around the rectum.  I then placed a  Babcock anterior to the rectum and grabbed the remainder of the colon and  pulled the colon out through the perineal incision.  There were a few  posterior attachments that I took down with the cautery, and the entire  specimen was removed en bloc.  I then opened the distal part of the specimen  starting at the anus and found a tumor approximately 2 cm above the dentate line.  This  was marked, and the entire specimen was sent to pathology for  further evaluation.  At this point in time, I irrigated out the pelvic sound  with  a Betadine-saline  solution.  Through a separate stab incision  involving the left buttock, I placed a 19-French round Jackson-Pratt drain  and placed this in the pelvic cavity.  I then repaired the posterior wall of  the vagina using 2-0 Vicryl to close this.  I then closed the pelvic floor  at this point in time with interrupted 2-0 Vicryl suture to reapproximate  the muscles of the pelvic floor.  Once this was done I closed the skin with  interrupted 2-0 nylon.  The drain was placed to bulb suction.   Attention was returned to the abdominal cavity.  Hemostasis appeared  excellent.  We irrigated out the pelvis and suctioned this out.  I then  closed the peritoneum of the pelvic floor using two running 2-0 Vicryl  sutures.  Care was taken to identify both ureters so that we would not harm  these, and these were both identified and appeared to be away from the  suture as we closed the peritoneum.  Once this was done, the patient was  then placed in reverse Trendelenburg.  The gallbladder had two very large  gallstones and some signs of chronic inflammation.  I elected to remove this  at this point in time given the large scope of this procedure.  A Kelly  clamp was used to grab the dome as well as the infundibulum.  I was able to  clear off the junction of the cystic duct and infundibulum and identify the  cystic duct entering the gallbladder.  This was the only tubular structure  entering the gallbladder.  Two large clips were placed on the down side and  another clip was placed on the up side.  The cystic duct was then divided.  The cystic artery was identified in a similar fashion on the body of the  gallbladder.  This was double clipped and divided as well.  Cautery was used  dissect the gallbladder from the gallbladder bed at this point in  time in a  dome-down fashion.  There were no other tubular structures that were  encountered.  This was passed off the field.  The gallbladder bed had some  bleeding, and this was controlled with a combination of cautery and  Surgicel.  At this point in time, the abdominal cavity inspected and there  was no evidence of any ongoing hemorrhage.  The remainder of the small bowel  appeared normal.  The stomach and liver appeared normal.  I did not feel any  masses in either lobe of the liver upon palpation of the liver.  The patient  had a right lower quadrant ostomy site marked preoperatively, and attention  was now turned to the formation of an ileostomy.  A Kocher was used to grab  the skin over the mark site.  Incision was made over this.  Dissection was  carried down and the subcutaneous tissues were removed.  The rectus  abdominis muscle was identified and a cruciate incision was placed on the  rectus abdominis.  At this point in time, we were able to enter the abdominal cavity and [place] was passed through the incision into the  abdominal cavity.  The ileum was grabbed with care taken not to twist it and  pulled out the incision.  This pulled out quite nicely.  We did have to take  a little bit of the mesenteric fat down to help this come out a lot easier.  Once this was done, all counts of sponge and needle  instruments were done at  this point in time.  The fascia was then closed in a running fashion using  #1 PDS.  Skin staples were used to close the skin.  The ileostomy was then  matured using 2-0 Vicryl.  An appliance and dry dressings were then applied.  All final counts of sponge, needle and instruments were found to be correct  at this portion of the case.  The patient was then taken to recovery in  satisfactory condition.      Thomas A. Cornett, M.D.  Electronically Signed     TAC/MEDQ  D:  02/12/2005  T:  02/13/2005  Job:  [id number]

## 2010-06-12 NOTE — Discharge Summary (Signed)
Sandy Trujillo, Sandy Trujillo NO.:  [id number]   MEDICAL RECORD NO.:  [id number]          PATIENT TYPE:  INP   LOCATION:  5739                         FACILITY:  [facility]   PHYSICIAN:  Jackie Plum, M.D.DATE OF BIRTH:  04-15-1932   DATE OF ADMISSION:  09/25/2004  DATE OF DISCHARGE:                                 DISCHARGE SUMMARY   INTERIM DISCHARGE SUMMARY:   DISCHARGE DIAGNOSES:  1.  Pneumonia.  2.  Ulcerative colitis, flare-up of diarrhea, resolved.  3.  Dysphagia.  4.  Anemia, iron deficiency.  5.  Hypertension.   DISCHARGE LABS:  From October 03, 2004:  WBC count 11.6 (peak of 20.5).  Hemoglobin 8.3, hematocrit 25.2, MCV 82.1, platelet count 629.  Sodium 143,  potassium 4.2, chloride 102, CO2 33, glucose 94, BUN 6, creatinine 0.9,  calcium 8.7.  Total iron binding capacity 184, % saturation 9, B12 more than  2000, folate 1384, ferritin 470, indicative of both chronic iron deficiency  anemia and anemia of chronic disease.   REASON FOR ADMISSION:  Pneumonia and ulcerative colitis.  Patient presented  to the office with fever, cough and diarrhea.  X-rays revealed a right mid  lung infiltrate.  She had crackles in the right posterior chest wall on  auscultation.  She was,  therefore, admitted for further management.   HOSPITAL COURSE:  Patient was started on antibiotics for pneumonia and other  aspirants with other pulmonary intervention protocols.  She was having  diarrhea as well and, therefore, had a followup colonoscopy by Dr. Arlyce Dice on  October 01, 2004, which showed emphysema involving transverse colon to  rectum with pseudopolyps and edema. Biopsies were taken which indicated  negative for dysplasia, results of which are still pending and needs to be  followed up at time of this dictation and dosage of Pentasa was upgraded to  1 g q.i.d.  Patient's diarrhea has resolved, she has done well without any  significant problems.  Of note, the patient was  noted to be having  difficulty with swallowing and modified barium swallow was done which  indicated oropharyngeal dysphagia.  She was started on dysphagia diet and  gradually it was then upgraded to dysphagia-3 diet at time of this  dictation/mechanical soft diet.  On October 02, 2004, her followup x-ray  had indicated worsening pneumonia and, therefore, CT scan of the chest was  done to followup on her worsening x-ray.  This indicated parenchymal opacity  throughout much of the right upper lobe, consistent with pneumonia with air  bronchograms.  There was also expansile nodular opacities throughout the  right lung which may be inflammatory in nature but cannot rule out  metastatic disease.  (The plan is for followup CT of the chest after  clearance of the right upper lobe opacity in about 2 months or so per PCP).  The patient continues to feel weak, but overall improved.  Her BP was  130/46, pulse 70, respirations 18, temperature 98.9 degrees Fahrenheit.  Her  T-max over the last 24 hours was 100.5 degrees Fahrenheit.  At 5:10 hours on  October 03, 2004, her  room air oxygen was 90%.  Her p.o. intake has  improved, she does not have any more diarrhea, no abdominal pain, no nausea.  She is a little bit weak. Patient will be re-evaluated tomorrow, at which  time decision to discharge her will be made.  This is an interim discharge  summary since I am going off-service and this patient will have discharge  soon and final discharge medications, discharge labs if applicable.   DISPOSITION AND CONDITION ON DISCHARGE:  Will be completed at time of  discharge.  Also, final discharge medications will be made at that time.      Jackie Plum, M.D.  Electronically Signed     GO/MEDQ  D:  10/04/2004  T:  10/04/2004  Job:  [id number]

## 2010-06-12 NOTE — H&P (Signed)
[name], Trujillo NO.:  [id number]   MEDICAL RECORD NO.:  [id number]          PATIENT TYPE:  INP   LOCATION:  5739                         FACILITY:  [facility]   PHYSICIAN:  Theressa Millard, M.D.    DATE OF BIRTH:  [birth year]   DATE OF ADMISSION:  09/25/2004  DATE OF DISCHARGE:                                HISTORY & PHYSICAL   Ms. Sandy Trujillo is a very nice 77 year old white female admitted with  pneumonia.   She was in her usual state of health until three days prior to admission  when she developed fever. She coincidentally had an appointment with Dr.  Pete Glatter a couple of days ago, and potassium was discontinued. Lasix and  lisinopril had been discontinued approximately a week ago by Dr. Donette Larry.  Today, she had a temperature to 103 and came to the emergency department.  She does note a mild cough and some mild right posterior chest pain which is  mildly pleuritic.   PAST MEDICAL HISTORY:  1.  Ulcerative colitis.  2.  Hypertension.  3.  Osteopenia.  4.  Iron-deficiency anemia.   PAST SURGICAL HISTORY:  1.  Operative reduction internal fixation of ankle fracture.  2.  Hysterectomy for fibroids.  3.  Breast biopsy x2, benign.   MEDICATIONS:  1.  Pentasa 250 mg q.i.d. p.o.  2.  Atenolol 50 mg daily.  3.  Calcium plus vitamin D daily.  4.  Multivitamin daily.   Until September 17, 2004, the patient was also on Lasix 20 mg daily and  lisinopril 5 mg 1/4 tablet daily, and she was on potassium until September 23, 2004.   ALLERGIES:  No known drug allergies, but Actonel and Fosamax give her GI  upset.   PHYSICAL EXAMINATION:  GENERAL:  She is ill appearing.  VITAL SIGNS:  Temperature is 103.2, pulse 96, respiratory rate 28, blood  pressure 147/70. O2 saturation 95%.  HEENT:  Eyes with pupils are equal, round, and reactive to light. Ears, nose  and throat are unremarkable except for dry mucous membranes.  NECK:  Supple. Thyroid is not enlarged or tender.  CHEST:  Reveals crackles in the right posterior chest.  CARDIOVASCULAR:  Normal.  ABDOMEN:  Negative with normal bowel sounds without hepatosplenomegaly or  mass.  EXTREMITIES:  Without clubbing, cyanosis, or edema.  SKIN:  Hot and dry.   LABORATORY DATA:  Pending.   Chest x-ray reveals right mid lung infiltrate.   IMPRESSION:  1.  Right lung pneumonia with fever.  2.  Ulcerative colitis.  3.  Hypertension.   PLAN:  The patient will be admitted. Blood cultures have been done. IV  fluids will be administered and IV antibiotics as well.      Theressa Millard, M.D.  Electronically Signed     JO/MEDQ  D:  09/25/2004  T:  09/26/2004  Job:  [id number]

## 2010-07-10 ENCOUNTER — Other Ambulatory Visit (HOSPITAL_COMMUNITY): Payer: Self-pay | Admitting: Geriatric Medicine

## 2010-07-10 DIAGNOSIS — Z1231 Encounter for screening mammogram for malignant neoplasm of breast: Secondary | ICD-10-CM

## 2010-08-13 ENCOUNTER — Ambulatory Visit (HOSPITAL_COMMUNITY)
Admission: RE | Admit: 2010-08-13 | Discharge: 2010-08-13 | Disposition: A | Discharge status: Home / Self Care | Payer: Medicare Other | Source: Ambulatory Visit | Attending: Geriatric Medicine | Admitting: Geriatric Medicine

## 2010-08-13 DIAGNOSIS — Z1231 Encounter for screening mammogram for malignant neoplasm of breast: Secondary | ICD-10-CM

## 2011-02-16 ENCOUNTER — Telehealth: Payer: Self-pay | Admitting: Hematology and Oncology

## 2011-02-16 NOTE — Telephone Encounter (Signed)
S/w the pt and she is aware of the march 2013 appt

## 2011-03-29 ENCOUNTER — Other Ambulatory Visit (HOSPITAL_BASED_OUTPATIENT_CLINIC_OR_DEPARTMENT_OTHER): Payer: Medicare Other | Admitting: Lab

## 2011-03-29 DIAGNOSIS — C20 Malignant neoplasm of rectum: Secondary | ICD-10-CM

## 2011-03-29 LAB — COMPREHENSIVE METABOLIC PANEL
ALT: 26 U/L (ref 0–35)
BUN: 22 mg/dL (ref 6–23)
CO2: 27 mEq/L (ref 19–32)
Calcium: 10.8 mg/dL — ABNORMAL HIGH (ref 8.4–10.5)
Chloride: 102 mEq/L (ref 96–112)
Creatinine, Ser: 0.87 mg/dL (ref 0.50–1.10)
Glucose, Bld: 83 mg/dL (ref 70–99)
Total Bilirubin: 0.5 mg/dL (ref 0.3–1.2)

## 2011-03-29 LAB — CBC WITH DIFFERENTIAL/PLATELET
BASO%: 1.6 % (ref 0.0–2.0)
Eosinophils Absolute: 0.6 10*3/uL — ABNORMAL HIGH (ref 0.0–0.5)
MCHC: 32.8 g/dL (ref 31.5–36.0)
MONO#: 0.5 10*3/uL (ref 0.1–0.9)
NEUT#: 4.1 10*3/uL (ref 1.5–6.5)
RBC: 4.36 10*6/uL (ref 3.70–5.45)
RDW: 13.7 % (ref 11.2–14.5)
WBC: 7.5 10*3/uL (ref 3.9–10.3)
lymph#: 2.1 10*3/uL (ref 0.9–3.3)
nRBC: 0 % (ref 0–0)

## 2011-03-29 LAB — CEA: CEA: 1 ng/mL (ref 0.0–5.0)

## 2011-04-02 ENCOUNTER — Encounter: Payer: Self-pay | Admitting: Hematology and Oncology

## 2011-04-02 ENCOUNTER — Ambulatory Visit (HOSPITAL_BASED_OUTPATIENT_CLINIC_OR_DEPARTMENT_OTHER): Payer: Medicare Other | Admitting: Hematology and Oncology

## 2011-04-02 ENCOUNTER — Telehealth: Payer: Self-pay | Admitting: Hematology and Oncology

## 2011-04-02 VITALS — BP 185/71 | HR 62 | Temp 97.0°F | Ht 60.0 in | Wt 159.5 lb

## 2011-04-02 DIAGNOSIS — C20 Malignant neoplasm of rectum: Secondary | ICD-10-CM

## 2011-04-02 NOTE — Telephone Encounter (Signed)
Pt to f/u in 70yrs march 2015. Template only defaults for one yr. Pt aware she will be contacted w/appt.

## 2011-04-02 NOTE — Patient Instructions (Signed)
    March 2013  Sunday Monday Tuesday Wednesday Thursday Friday Saturday                  1   2    3   4    LAB MO    10:00 AM  (15 min.)  Delcie Roch  [facility] 5   6   7   8    EST PT 30  11:00 AM  (30 min.)  Laurice Record, MD  [facility]COL[facility] 9    10   11   12   13   14   15   16    17   18   19   20   21   22   23    24   25   26   27   28   29   30    31                           Current Outpatient Prescriptions  Medication Sig Dispense Refill  . ALPRAZolam (XANAX) 0.25 MG tablet Take 0.25 mg by mouth as needed.      Marland Kitchen aspirin 325 MG tablet Take 325 mg by mouth daily.      Marland Kitchen atenolol (TENORMIN) 50 MG tablet Take 50 mg by mouth daily.      . B Complex-C-E-Zn (B COMPLEX-C-E-ZINC) tablet Take 1 tablet by mouth daily.      . Calcium-Vitamin D-Vitamin K (VIACTIV PO) Take 1 tablet by mouth 2 (two) times daily.      . cetirizine (ZYRTEC) 10 MG tablet Take 10 mg by mouth as needed.      Donald Prose (FLORA-Q) CAPS Take 1 capsule by mouth every 14 (fourteen) days.      . folic acid (FOLVITE) 1 MG tablet Take 1 mg by mouth daily.      Marland Kitchen loperamide (IMODIUM) 2 MG capsule Take 2 mg by mouth as needed.      . magnesium (MAGTAB) 84 MG ( ) TBCR Take 84 mg by mouth daily.      . Multiple Vitamins-Minerals (CENTRUM SILVER PO) Take 1 tablet by mouth daily.

## 2011-04-02 NOTE — Progress Notes (Signed)
CC:   Merlene Laughter, MD Barbette Hair. Arlyce Dice, MD,FACG [place] Pu. Cornett, M.D.  IDENTIFYING PATIENT:  The patient is a 78 year old woman with rectal cancer who presents for followup.  INTERVAL HISTORY:  The patient was seen a year ago.  Is doing well with no current concerns.  Her weight is stable.  She has no nausea or vomiting.  MEDICATIONS:  Reviewed and updated.  ALLERGIES:  None.  PHYSICAL EXAMINATION:  Well-appearing well-nourished woman in no distress.  Vitals:  Pulse 62, blood pressure 185/71, temperature 97, respirations 20, weight 159 pounds.  HEENT:  Head is atraumatic, normocephalic.  Sclerae anicteric.  Mouth moist.  Neck:  Supple.  Chest: Clear.  CVS unremarkable.  Abdomen:  Soft, nontender.  Bowel sounds present.  extremities:  No edema.  LABORATORY DATA:  03/29/2011:  White cell count 7.5, hemoglobin 12.5, hematocrit 38.1, platelets 277.  Sodium 138, potassium 4.8, chloride 102, CO2 27, BUN 22, creatinine 0.87, glucose 83, total bilirubin 0.5, alkaline phosphatase 75, AST 22, ALT 26, calcium 10.  CEA 1.  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  She is status post total colectomy with end-ileostomy and a cholecystectomy on February 22, 2005.  Her labs are adequate.  So is her exam and review of systems.  So she follows up as she would prefer to follow up here, in two year's Time or sooner if needed.    ______________________________ Laurice Record, M.D. LIO/MEDQ  D:  04/02/2011  T:  04/02/2011  Job:  [id number]

## 2011-04-02 NOTE — Progress Notes (Signed)
This office note has been dictated.

## 2011-05-12 DIAGNOSIS — I10 Essential (primary) hypertension: Secondary | ICD-10-CM | POA: Diagnosis not present

## 2011-05-12 DIAGNOSIS — R002 Palpitations: Secondary | ICD-10-CM | POA: Diagnosis not present

## 2011-05-12 DIAGNOSIS — Z79899 Other long term (current) drug therapy: Secondary | ICD-10-CM | POA: Diagnosis not present

## 2011-07-05 ENCOUNTER — Other Ambulatory Visit (HOSPITAL_COMMUNITY): Payer: Self-pay | Admitting: Geriatric Medicine

## 2011-07-05 DIAGNOSIS — Z1231 Encounter for screening mammogram for malignant neoplasm of breast: Secondary | ICD-10-CM

## 2011-07-14 DIAGNOSIS — H2510 Age-related nuclear cataract, unspecified eye: Secondary | ICD-10-CM | POA: Diagnosis not present

## 2011-07-14 DIAGNOSIS — H40009 Preglaucoma, unspecified, unspecified eye: Secondary | ICD-10-CM | POA: Diagnosis not present

## 2011-08-17 ENCOUNTER — Ambulatory Visit (HOSPITAL_COMMUNITY)
Admission: RE | Admit: 2011-08-17 | Discharge: 2011-08-17 | Disposition: A | Discharge status: Home / Self Care | Payer: Medicare Other | Source: Ambulatory Visit | Attending: Geriatric Medicine | Admitting: Geriatric Medicine

## 2011-08-17 DIAGNOSIS — Z1231 Encounter for screening mammogram for malignant neoplasm of breast: Secondary | ICD-10-CM | POA: Diagnosis not present

## 2011-08-19 ENCOUNTER — Other Ambulatory Visit: Payer: Self-pay | Admitting: Geriatric Medicine

## 2011-08-19 DIAGNOSIS — R928 Other abnormal and inconclusive findings on diagnostic imaging of breast: Secondary | ICD-10-CM

## 2011-08-25 ENCOUNTER — Ambulatory Visit
Admission: RE | Admit: 2011-08-25 | Discharge: 2011-08-25 | Disposition: A | Discharge status: Home / Self Care | Payer: Medicare Other | Source: Ambulatory Visit | Attending: Geriatric Medicine | Admitting: Geriatric Medicine

## 2011-08-25 DIAGNOSIS — R928 Other abnormal and inconclusive findings on diagnostic imaging of breast: Secondary | ICD-10-CM | POA: Diagnosis not present

## 2011-11-19 DIAGNOSIS — I10 Essential (primary) hypertension: Secondary | ICD-10-CM | POA: Diagnosis not present

## 2011-11-19 DIAGNOSIS — Z0000 Encounter for general adult medical examination without abnormal findings: Secondary | ICD-10-CM | POA: Diagnosis not present

## 2011-11-19 DIAGNOSIS — Z1331 Encounter for screening for depression: Secondary | ICD-10-CM | POA: Diagnosis not present

## 2011-11-19 DIAGNOSIS — Z23 Encounter for immunization: Secondary | ICD-10-CM | POA: Diagnosis not present

## 2011-11-19 DIAGNOSIS — Z79899 Other long term (current) drug therapy: Secondary | ICD-10-CM | POA: Diagnosis not present

## 2011-11-22 DIAGNOSIS — D235 Other benign neoplasm of skin of trunk: Secondary | ICD-10-CM | POA: Diagnosis not present

## 2011-11-22 DIAGNOSIS — L570 Actinic keratosis: Secondary | ICD-10-CM | POA: Diagnosis not present

## 2011-12-20 DIAGNOSIS — H40019 Open angle with borderline findings, low risk, unspecified eye: Secondary | ICD-10-CM | POA: Diagnosis not present

## 2012-01-28 DIAGNOSIS — R21 Rash and other nonspecific skin eruption: Secondary | ICD-10-CM | POA: Diagnosis not present

## 2012-02-07 DIAGNOSIS — H40019 Open angle with borderline findings, low risk, unspecified eye: Secondary | ICD-10-CM | POA: Diagnosis not present

## 2012-02-07 DIAGNOSIS — H2510 Age-related nuclear cataract, unspecified eye: Secondary | ICD-10-CM | POA: Diagnosis not present

## 2012-02-11 DIAGNOSIS — H269 Unspecified cataract: Secondary | ICD-10-CM | POA: Diagnosis not present

## 2012-02-17 DIAGNOSIS — F411 Generalized anxiety disorder: Secondary | ICD-10-CM | POA: Diagnosis not present

## 2012-02-17 DIAGNOSIS — Z7982 Long term (current) use of aspirin: Secondary | ICD-10-CM | POA: Diagnosis not present

## 2012-02-17 DIAGNOSIS — H2589 Other age-related cataract: Secondary | ICD-10-CM | POA: Diagnosis not present

## 2012-02-17 DIAGNOSIS — I10 Essential (primary) hypertension: Secondary | ICD-10-CM | POA: Diagnosis not present

## 2012-02-18 DIAGNOSIS — H2510 Age-related nuclear cataract, unspecified eye: Secondary | ICD-10-CM | POA: Diagnosis not present

## 2012-03-02 DIAGNOSIS — F411 Generalized anxiety disorder: Secondary | ICD-10-CM | POA: Diagnosis not present

## 2012-03-02 DIAGNOSIS — H2510 Age-related nuclear cataract, unspecified eye: Secondary | ICD-10-CM | POA: Diagnosis not present

## 2012-03-02 DIAGNOSIS — H40019 Open angle with borderline findings, low risk, unspecified eye: Secondary | ICD-10-CM | POA: Diagnosis not present

## 2012-03-02 DIAGNOSIS — I10 Essential (primary) hypertension: Secondary | ICD-10-CM | POA: Diagnosis not present

## 2012-03-02 DIAGNOSIS — H2589 Other age-related cataract: Secondary | ICD-10-CM | POA: Diagnosis not present

## 2012-03-03 DIAGNOSIS — H2510 Age-related nuclear cataract, unspecified eye: Secondary | ICD-10-CM | POA: Diagnosis not present

## 2012-05-19 DIAGNOSIS — I10 Essential (primary) hypertension: Secondary | ICD-10-CM | POA: Diagnosis not present

## 2012-05-25 ENCOUNTER — Telehealth: Payer: Self-pay | Admitting: Oncology

## 2012-05-25 ENCOUNTER — Encounter: Payer: Self-pay | Admitting: Oncology

## 2012-05-25 NOTE — Telephone Encounter (Signed)
Pt lmonvm stating she was a patient of [facility] and received a letter re [facility] leaving but didn't get any futher info. Pt last seen 04/02/11 and order was given for 64yr f/u - March 2015. Returned call and gv pt appt for 04/05/13 w/FS and mailed schedule and letter.

## 2012-06-07 DIAGNOSIS — H2640 Unspecified secondary cataract: Secondary | ICD-10-CM | POA: Diagnosis not present

## 2012-07-18 ENCOUNTER — Other Ambulatory Visit (HOSPITAL_COMMUNITY): Payer: Self-pay | Admitting: Geriatric Medicine

## 2012-07-18 DIAGNOSIS — Z1231 Encounter for screening mammogram for malignant neoplasm of breast: Secondary | ICD-10-CM

## 2012-08-11 DIAGNOSIS — M79609 Pain in unspecified limb: Secondary | ICD-10-CM | POA: Diagnosis not present

## 2012-08-11 DIAGNOSIS — I10 Essential (primary) hypertension: Secondary | ICD-10-CM | POA: Diagnosis not present

## 2012-08-28 ENCOUNTER — Ambulatory Visit (HOSPITAL_COMMUNITY): Payer: Medicare Other

## 2012-08-31 ENCOUNTER — Ambulatory Visit (HOSPITAL_COMMUNITY)
Admission: RE | Admit: 2012-08-31 | Discharge: 2012-08-31 | Disposition: A | Discharge status: Home / Self Care | Payer: Medicare Other | Source: Ambulatory Visit | Attending: Geriatric Medicine | Admitting: Geriatric Medicine

## 2012-08-31 DIAGNOSIS — Z1231 Encounter for screening mammogram for malignant neoplasm of breast: Secondary | ICD-10-CM | POA: Diagnosis not present

## 2012-09-05 ENCOUNTER — Other Ambulatory Visit: Payer: Self-pay | Admitting: Geriatric Medicine

## 2012-09-05 DIAGNOSIS — R928 Other abnormal and inconclusive findings on diagnostic imaging of breast: Secondary | ICD-10-CM

## 2012-09-12 DIAGNOSIS — M79609 Pain in unspecified limb: Secondary | ICD-10-CM | POA: Diagnosis not present

## 2012-09-21 ENCOUNTER — Ambulatory Visit
Admission: RE | Admit: 2012-09-21 | Discharge: 2012-09-21 | Disposition: A | Discharge status: Home / Self Care | Payer: Medicare Other | Source: Ambulatory Visit | Attending: Geriatric Medicine | Admitting: Geriatric Medicine

## 2012-09-21 DIAGNOSIS — R928 Other abnormal and inconclusive findings on diagnostic imaging of breast: Secondary | ICD-10-CM

## 2012-09-21 DIAGNOSIS — N6009 Solitary cyst of unspecified breast: Secondary | ICD-10-CM | POA: Diagnosis not present

## 2012-11-21 DIAGNOSIS — Z23 Encounter for immunization: Secondary | ICD-10-CM | POA: Diagnosis not present

## 2012-11-21 DIAGNOSIS — Z1331 Encounter for screening for depression: Secondary | ICD-10-CM | POA: Diagnosis not present

## 2012-11-21 DIAGNOSIS — I10 Essential (primary) hypertension: Secondary | ICD-10-CM | POA: Diagnosis not present

## 2012-11-21 DIAGNOSIS — Z0000 Encounter for general adult medical examination without abnormal findings: Secondary | ICD-10-CM | POA: Diagnosis not present

## 2012-12-28 DIAGNOSIS — R030 Elevated blood-pressure reading, without diagnosis of hypertension: Secondary | ICD-10-CM | POA: Diagnosis not present

## 2012-12-28 DIAGNOSIS — N390 Urinary tract infection, site not specified: Secondary | ICD-10-CM | POA: Diagnosis not present

## 2013-01-02 DIAGNOSIS — Z79899 Other long term (current) drug therapy: Secondary | ICD-10-CM | POA: Diagnosis not present

## 2013-01-02 DIAGNOSIS — I10 Essential (primary) hypertension: Secondary | ICD-10-CM | POA: Diagnosis not present

## 2013-01-08 DIAGNOSIS — I10 Essential (primary) hypertension: Secondary | ICD-10-CM | POA: Diagnosis not present

## 2013-01-31 DIAGNOSIS — Z79899 Other long term (current) drug therapy: Secondary | ICD-10-CM | POA: Diagnosis not present

## 2013-01-31 DIAGNOSIS — I10 Essential (primary) hypertension: Secondary | ICD-10-CM | POA: Diagnosis not present

## 2013-03-28 ENCOUNTER — Telehealth: Payer: Self-pay | Admitting: Oncology

## 2013-03-28 NOTE — Telephone Encounter (Signed)
returned pt call lvm and advised to call back with when she is trying to r/s

## 2013-04-05 ENCOUNTER — Other Ambulatory Visit: Payer: Medicare Other

## 2013-04-05 ENCOUNTER — Ambulatory Visit: Payer: Medicare Other | Admitting: Oncology

## 2013-04-26 ENCOUNTER — Other Ambulatory Visit: Payer: Self-pay | Admitting: Oncology

## 2013-04-26 DIAGNOSIS — C20 Malignant neoplasm of rectum: Secondary | ICD-10-CM

## 2013-04-27 ENCOUNTER — Encounter: Payer: Self-pay | Admitting: Oncology

## 2013-04-27 ENCOUNTER — Other Ambulatory Visit (HOSPITAL_BASED_OUTPATIENT_CLINIC_OR_DEPARTMENT_OTHER): Payer: Medicare Other

## 2013-04-27 ENCOUNTER — Ambulatory Visit (HOSPITAL_BASED_OUTPATIENT_CLINIC_OR_DEPARTMENT_OTHER): Payer: Medicare Other | Admitting: Oncology

## 2013-04-27 VITALS — BP 171/62 | HR 61 | Temp 98.0°F | Resp 20 | Ht 60.0 in | Wt 156.8 lb

## 2013-04-27 DIAGNOSIS — Z85048 Personal history of other malignant neoplasm of rectum, rectosigmoid junction, and anus: Secondary | ICD-10-CM

## 2013-04-27 DIAGNOSIS — C20 Malignant neoplasm of rectum: Secondary | ICD-10-CM | POA: Diagnosis not present

## 2013-04-27 LAB — COMPREHENSIVE METABOLIC PANEL (CC13)
ALT: 19 U/L (ref 0–55)
ANION GAP: 9 meq/L (ref 3–11)
AST: 18 U/L (ref 5–34)
Albumin: 4 g/dL (ref 3.5–5.0)
Alkaline Phosphatase: 84 U/L (ref 40–150)
BUN: 19.7 mg/dL (ref 7.0–26.0)
CALCIUM: 11.3 mg/dL — AB (ref 8.4–10.4)
CHLORIDE: 102 meq/L (ref 98–109)
CO2: 24 meq/L (ref 22–29)
CREATININE: 1 mg/dL (ref 0.6–1.1)
Glucose: 98 mg/dl (ref 70–140)
Potassium: 5.4 mEq/L — ABNORMAL HIGH (ref 3.5–5.1)
SODIUM: 135 meq/L — AB (ref 136–145)
TOTAL PROTEIN: 7.9 g/dL (ref 6.4–8.3)
Total Bilirubin: 0.58 mg/dL (ref 0.20–1.20)

## 2013-04-27 LAB — CBC WITH DIFFERENTIAL/PLATELET
BASO%: 2.5 % — AB (ref 0.0–2.0)
Basophils Absolute: 0.2 10*3/uL — ABNORMAL HIGH (ref 0.0–0.1)
EOS%: 5.4 % (ref 0.0–7.0)
Eosinophils Absolute: 0.4 10*3/uL (ref 0.0–0.5)
HCT: 38.8 % (ref 34.8–46.6)
HGB: 12.7 g/dL (ref 11.6–15.9)
LYMPH#: 1.7 10*3/uL (ref 0.9–3.3)
LYMPH%: 22 % (ref 14.0–49.7)
MCH: 29.1 pg (ref 25.1–34.0)
MCHC: 32.8 g/dL (ref 31.5–36.0)
MCV: 88.7 fL (ref 79.5–101.0)
MONO#: 0.4 10*3/uL (ref 0.1–0.9)
MONO%: 5.6 % (ref 0.0–14.0)
NEUT#: 5.1 10*3/uL (ref 1.5–6.5)
NEUT%: 64.5 % (ref 38.4–76.8)
Platelets: 262 10*3/uL (ref 145–400)
RBC: 4.37 10*6/uL (ref 3.70–5.45)
RDW: 12.9 % (ref 11.2–14.5)
WBC: 7.9 10*3/uL (ref 3.9–10.3)

## 2013-04-27 LAB — CEA: CEA: 0.8 ng/mL (ref 0.0–5.0)

## 2013-04-27 NOTE — Progress Notes (Signed)
Hematology and Oncology Follow Up Visit  Sandy Trujillo [id number] 02-24-1932 80 y.o. 04/27/2013 8:48 AM Sandy Trujillo, MDStoneking, Hal, MD   Principle Diagnosis: 80 year old woman diagnosed with adenocarcinoma of the rectum in the setting of ulcerative colitis in January of 2007. She had a T1 N0.   Prior Therapy: She is status post total colectomy and end ileostomy and cholecystectomy in 02/22/2005.  Current therapy: Observation and surveillance.  Interim History:  Ms. Sandy Trujillo is as today for a followup visit. She is a nice woman diagnosed with early stage rectal cancer dating back to 2007. She underwent a total colectomy due to her history of ulcerative colitis. She has been disease-free now for at least 8 years. Since her last visit, she have been completely asymptomatic. She has not reported any nausea or vomiting. Did not report any abdominal pain or distention. Has not reported any hematochezia or melena. Has not reported any major decline in her performance status or activity level. She is managing with her ileostomy fairly well without any major electrolyte imbalance.  Medications: I have reviewed the patient's current medications.  Current Outpatient Prescriptions  Medication Sig Dispense Refill  . ALPRAZolam (XANAX) 0.25 MG tablet Take 0.25 mg by mouth as needed.      Marland Kitchen atenolol (TENORMIN) 50 MG tablet Take 50 mg by mouth daily.      . B Complex-C-E-Zn (B COMPLEX-C-E-ZINC) tablet Take 1 tablet by mouth daily.      . fexofenadine-pseudoephedrine (ALLEGRA-D) 60-120 MG per tablet Take 1 tablet by mouth as needed.      . folic acid (FOLVITE) 1 MG tablet Take 1 mg by mouth daily.      Marland Kitchen lisinopril (PRINIVIL,ZESTRIL) 5 MG tablet Take 5 mg by mouth daily. 1/2 TABLET DAILY      . loperamide (IMODIUM) 2 MG capsule Take 2 mg by mouth as needed.      . magnesium 30 MG tablet Take 250 mg by mouth 2 (two) times daily.      . Multiple Vitamins-Minerals (CENTRUM SILVER PO) Take 1  tablet by mouth daily.       No current facility-administered medications for this visit.     Allergies: No Known Allergies  Past Medical History, Surgical history, Social history, and Family History were reviewed and updated.  Review of Systems: Constitutional:  Negative for fever, chills, night sweats, anorexia, weight loss, pain. Cardiovascular: no chest pain or dyspnea on exertion Respiratory: no cough, shortness of breath, or wheezing Neurological: no TIA or stroke symptoms Dermatological: negative ENT: negative Skin: Negative. Gastrointestinal: no abdominal pain, change in bowel habits, or black or bloody stools Genito-Urinary: no dysuria, trouble voiding, or hematuria Hematological and Lymphatic: negative Breast: negative Musculoskeletal: negative Remaining ROS negative. Physical Exam: Blood pressure 171/62, pulse 61, temperature 98 F (36.7 C), temperature source Oral, resp. rate 20, height 5' (1.524 m), weight 156 lb 12.8 oz (71.124 kg). ECOG: 0 General appearance: alert, cooperative and appears stated age Head: Normocephalic, without obvious abnormality, atraumatic Neck: no adenopathy, no carotid bruit, no JVD, supple, symmetrical, trachea midline and thyroid not enlarged, symmetric, no tenderness/mass/nodules Lymph nodes: Cervical, supraclavicular, and axillary nodes normal. Heart:regular rate and rhythm, S1, S2 normal, no murmur, click, rub or gallop Lung:chest clear, no wheezing, rales, normal symmetric air entry Abdomin: soft, non-tender, without masses or organomegaly EXT:no erythema, induration, or nodules   Lab Results: Lab Results  Component Value Date   WBC 7.9 04/27/2013   HGB 12.7 04/27/2013   HCT 38.8 04/27/2013  MCV 88.7 04/27/2013   PLT 262 04/27/2013     Chemistry      Component Value Date/Time   NA 138 03/29/2011 0954   NA 145 03/26/2010 0817   K 4.8 03/29/2011 0954   K 4.5 03/26/2010 0817   CL 102 03/29/2011 0954   CL 101 03/26/2010 0817   CO2 27 03/29/2011  0954   CO2 28 03/26/2010 0817   BUN 22 03/29/2011 0954   BUN 19 03/26/2010 0817   CREATININE 0.87 03/29/2011 0954   CREATININE 0.9 03/26/2010 0817      Component Value Date/Time   CALCIUM 10.8* 03/29/2011 0954   CALCIUM 10.3 03/26/2010 0817   ALKPHOS 75 03/29/2011 0954   ALKPHOS 70 03/26/2010 0817   AST 22 03/29/2011 0954   AST 40* 03/26/2010 0817   ALT 26 03/29/2011 0954   ALT 40 03/26/2010 0817   BILITOT 0.5 03/29/2011 0954   BILITOT 0.50 03/26/2010 0817       Impression and Plan:  80 year old woman with the following issues:  1. Rectal adenocarcinoma diagnosed in January of 2007. She is status post total colectomy due to her history of ulcerative colitis. She had a T1 N0 disease and have been disease free since that time. Her laboratory values were reviewed today and completely normal at this time. She is asymptomatic and does not have any suggestions of relapse disease. I think the chance of her developing recurrent rectal cancer at this point is very low. No further workup is really needed and and surveillance and followup here will be as needed. I will set her up for followup in 2 years time but it up to her she wants to return as needed.  2. History of ulcerative colitis: That have been taken care of by her colectomy without any symptoms.  3. Health maintenance: She is up-to-date regarding her other cancer screening. She had a mammography and ultrasound of the breasts which showed benign findings.  [facility], MD 4/3/20158:48 AM

## 2013-05-01 ENCOUNTER — Telehealth: Payer: Self-pay | Admitting: Oncology

## 2013-05-01 NOTE — Telephone Encounter (Signed)
, °

## 2013-05-07 DIAGNOSIS — D235 Other benign neoplasm of skin of trunk: Secondary | ICD-10-CM | POA: Diagnosis not present

## 2013-05-07 DIAGNOSIS — L821 Other seborrheic keratosis: Secondary | ICD-10-CM | POA: Diagnosis not present

## 2013-05-17 DIAGNOSIS — I10 Essential (primary) hypertension: Secondary | ICD-10-CM | POA: Diagnosis not present

## 2013-07-03 DIAGNOSIS — I10 Essential (primary) hypertension: Secondary | ICD-10-CM | POA: Diagnosis not present

## 2013-07-03 DIAGNOSIS — M109 Gout, unspecified: Secondary | ICD-10-CM | POA: Diagnosis not present

## 2013-07-16 DIAGNOSIS — Z961 Presence of intraocular lens: Secondary | ICD-10-CM | POA: Diagnosis not present

## 2013-07-16 DIAGNOSIS — H5200 Hypermetropia, unspecified eye: Secondary | ICD-10-CM | POA: Diagnosis not present

## 2013-08-15 ENCOUNTER — Other Ambulatory Visit (HOSPITAL_COMMUNITY): Payer: Self-pay | Admitting: Geriatric Medicine

## 2013-08-15 DIAGNOSIS — Z1231 Encounter for screening mammogram for malignant neoplasm of breast: Secondary | ICD-10-CM

## 2013-09-11 ENCOUNTER — Ambulatory Visit (HOSPITAL_COMMUNITY): Payer: Medicare Other

## 2013-09-13 ENCOUNTER — Ambulatory Visit (HOSPITAL_COMMUNITY)
Admission: RE | Admit: 2013-09-13 | Discharge: 2013-09-13 | Disposition: A | Discharge status: Home / Self Care | Payer: Medicare Other | Source: Ambulatory Visit | Attending: Geriatric Medicine | Admitting: Geriatric Medicine

## 2013-09-13 DIAGNOSIS — Z1231 Encounter for screening mammogram for malignant neoplasm of breast: Secondary | ICD-10-CM | POA: Insufficient documentation

## 2013-11-07 DIAGNOSIS — I10 Essential (primary) hypertension: Secondary | ICD-10-CM | POA: Diagnosis not present

## 2013-11-07 DIAGNOSIS — R002 Palpitations: Secondary | ICD-10-CM | POA: Diagnosis not present

## 2013-11-14 DIAGNOSIS — N390 Urinary tract infection, site not specified: Secondary | ICD-10-CM | POA: Diagnosis not present

## 2013-11-14 DIAGNOSIS — N3001 Acute cystitis with hematuria: Secondary | ICD-10-CM | POA: Diagnosis not present

## 2013-11-26 DIAGNOSIS — R300 Dysuria: Secondary | ICD-10-CM | POA: Diagnosis not present

## 2013-12-04 DIAGNOSIS — I10 Essential (primary) hypertension: Secondary | ICD-10-CM | POA: Diagnosis not present

## 2013-12-12 DIAGNOSIS — Z23 Encounter for immunization: Secondary | ICD-10-CM | POA: Diagnosis not present

## 2014-01-01 DIAGNOSIS — E213 Hyperparathyroidism, unspecified: Secondary | ICD-10-CM | POA: Diagnosis not present

## 2014-01-01 DIAGNOSIS — I129 Hypertensive chronic kidney disease with stage 1 through stage 4 chronic kidney disease, or unspecified chronic kidney disease: Secondary | ICD-10-CM | POA: Diagnosis not present

## 2014-01-01 DIAGNOSIS — I10 Essential (primary) hypertension: Secondary | ICD-10-CM | POA: Diagnosis not present

## 2014-01-01 DIAGNOSIS — Z23 Encounter for immunization: Secondary | ICD-10-CM | POA: Diagnosis not present

## 2014-01-01 DIAGNOSIS — Z0000 Encounter for general adult medical examination without abnormal findings: Secondary | ICD-10-CM | POA: Diagnosis not present

## 2014-01-01 DIAGNOSIS — Z932 Ileostomy status: Secondary | ICD-10-CM | POA: Diagnosis not present

## 2014-01-01 DIAGNOSIS — Z1389 Encounter for screening for other disorder: Secondary | ICD-10-CM | POA: Diagnosis not present

## 2014-01-01 DIAGNOSIS — Z79899 Other long term (current) drug therapy: Secondary | ICD-10-CM | POA: Diagnosis not present

## 2014-01-01 DIAGNOSIS — C20 Malignant neoplasm of rectum: Secondary | ICD-10-CM | POA: Diagnosis not present

## 2014-03-27 DIAGNOSIS — R1110 Vomiting, unspecified: Secondary | ICD-10-CM | POA: Diagnosis not present

## 2014-03-27 DIAGNOSIS — A084 Viral intestinal infection, unspecified: Secondary | ICD-10-CM | POA: Diagnosis not present

## 2014-05-01 DIAGNOSIS — N390 Urinary tract infection, site not specified: Secondary | ICD-10-CM | POA: Diagnosis not present

## 2014-05-01 DIAGNOSIS — R309 Painful micturition, unspecified: Secondary | ICD-10-CM | POA: Diagnosis not present

## 2014-05-09 DIAGNOSIS — R312 Other microscopic hematuria: Secondary | ICD-10-CM | POA: Diagnosis not present

## 2014-05-22 DIAGNOSIS — D225 Melanocytic nevi of trunk: Secondary | ICD-10-CM | POA: Diagnosis not present

## 2014-05-25 DIAGNOSIS — H6691 Otitis media, unspecified, right ear: Secondary | ICD-10-CM | POA: Diagnosis not present

## 2014-05-25 DIAGNOSIS — J3089 Other allergic rhinitis: Secondary | ICD-10-CM | POA: Diagnosis not present

## 2014-07-02 DIAGNOSIS — M810 Age-related osteoporosis without current pathological fracture: Secondary | ICD-10-CM | POA: Diagnosis not present

## 2014-07-02 DIAGNOSIS — I10 Essential (primary) hypertension: Secondary | ICD-10-CM | POA: Diagnosis not present

## 2014-07-17 DIAGNOSIS — H5203 Hypermetropia, bilateral: Secondary | ICD-10-CM | POA: Diagnosis not present

## 2014-07-17 DIAGNOSIS — Z961 Presence of intraocular lens: Secondary | ICD-10-CM | POA: Diagnosis not present

## 2014-08-14 DIAGNOSIS — M810 Age-related osteoporosis without current pathological fracture: Secondary | ICD-10-CM | POA: Diagnosis not present

## 2014-08-21 DIAGNOSIS — M810 Age-related osteoporosis without current pathological fracture: Secondary | ICD-10-CM | POA: Diagnosis not present

## 2014-08-21 DIAGNOSIS — Z79899 Other long term (current) drug therapy: Secondary | ICD-10-CM | POA: Diagnosis not present

## 2014-08-28 DIAGNOSIS — L820 Inflamed seborrheic keratosis: Secondary | ICD-10-CM | POA: Diagnosis not present

## 2014-09-17 ENCOUNTER — Other Ambulatory Visit (HOSPITAL_COMMUNITY): Payer: Self-pay | Admitting: Geriatric Medicine

## 2014-09-17 DIAGNOSIS — Z1231 Encounter for screening mammogram for malignant neoplasm of breast: Secondary | ICD-10-CM

## 2014-09-25 ENCOUNTER — Ambulatory Visit (HOSPITAL_COMMUNITY)
Admission: RE | Admit: 2014-09-25 | Discharge: 2014-09-25 | Disposition: A | Discharge status: Home / Self Care | Payer: Medicare Other | Source: Ambulatory Visit | Attending: Geriatric Medicine | Admitting: Geriatric Medicine

## 2014-09-25 DIAGNOSIS — Z1231 Encounter for screening mammogram for malignant neoplasm of breast: Secondary | ICD-10-CM | POA: Diagnosis not present

## 2014-09-26 ENCOUNTER — Other Ambulatory Visit: Payer: Self-pay | Admitting: Geriatric Medicine

## 2014-09-26 DIAGNOSIS — R928 Other abnormal and inconclusive findings on diagnostic imaging of breast: Secondary | ICD-10-CM

## 2014-10-02 ENCOUNTER — Other Ambulatory Visit: Payer: Medicare Other

## 2014-10-09 ENCOUNTER — Ambulatory Visit
Admission: RE | Admit: 2014-10-09 | Discharge: 2014-10-09 | Disposition: A | Discharge status: Home / Self Care | Payer: Medicare Other | Source: Ambulatory Visit | Attending: Geriatric Medicine | Admitting: Geriatric Medicine

## 2014-10-09 DIAGNOSIS — N6001 Solitary cyst of right breast: Secondary | ICD-10-CM | POA: Diagnosis not present

## 2014-10-09 DIAGNOSIS — R928 Other abnormal and inconclusive findings on diagnostic imaging of breast: Secondary | ICD-10-CM

## 2014-11-20 DIAGNOSIS — I10 Essential (primary) hypertension: Secondary | ICD-10-CM | POA: Diagnosis not present

## 2014-11-20 DIAGNOSIS — Z23 Encounter for immunization: Secondary | ICD-10-CM | POA: Diagnosis not present

## 2014-11-29 DIAGNOSIS — Z932 Ileostomy status: Secondary | ICD-10-CM | POA: Diagnosis not present

## 2014-11-29 DIAGNOSIS — I10 Essential (primary) hypertension: Secondary | ICD-10-CM | POA: Diagnosis not present

## 2014-12-09 DIAGNOSIS — Z79899 Other long term (current) drug therapy: Secondary | ICD-10-CM | POA: Diagnosis not present

## 2015-01-08 DIAGNOSIS — I10 Essential (primary) hypertension: Secondary | ICD-10-CM | POA: Diagnosis not present

## 2015-01-08 DIAGNOSIS — M810 Age-related osteoporosis without current pathological fracture: Secondary | ICD-10-CM | POA: Diagnosis not present

## 2015-01-08 DIAGNOSIS — Z0000 Encounter for general adult medical examination without abnormal findings: Secondary | ICD-10-CM | POA: Diagnosis not present

## 2015-01-08 DIAGNOSIS — Z932 Ileostomy status: Secondary | ICD-10-CM | POA: Diagnosis not present

## 2015-01-08 DIAGNOSIS — Z79899 Other long term (current) drug therapy: Secondary | ICD-10-CM | POA: Diagnosis not present

## 2015-01-08 DIAGNOSIS — E213 Hyperparathyroidism, unspecified: Secondary | ICD-10-CM | POA: Diagnosis not present

## 2015-01-08 DIAGNOSIS — Z1389 Encounter for screening for other disorder: Secondary | ICD-10-CM | POA: Diagnosis not present

## 2015-03-31 DIAGNOSIS — B009 Herpesviral infection, unspecified: Secondary | ICD-10-CM | POA: Diagnosis not present

## 2015-07-21 DIAGNOSIS — H5203 Hypermetropia, bilateral: Secondary | ICD-10-CM | POA: Diagnosis not present

## 2015-07-21 DIAGNOSIS — Z961 Presence of intraocular lens: Secondary | ICD-10-CM | POA: Diagnosis not present

## 2015-07-21 DIAGNOSIS — H52203 Unspecified astigmatism, bilateral: Secondary | ICD-10-CM | POA: Diagnosis not present

## 2015-09-01 ENCOUNTER — Other Ambulatory Visit: Payer: Self-pay | Admitting: Geriatric Medicine

## 2015-09-01 DIAGNOSIS — Z1231 Encounter for screening mammogram for malignant neoplasm of breast: Secondary | ICD-10-CM

## 2015-09-30 ENCOUNTER — Ambulatory Visit
Admission: RE | Admit: 2015-09-30 | Discharge: 2015-09-30 | Disposition: A | Discharge status: Home / Self Care | Payer: Medicare Other | Source: Ambulatory Visit | Attending: Geriatric Medicine | Admitting: Geriatric Medicine

## 2015-09-30 DIAGNOSIS — Z1231 Encounter for screening mammogram for malignant neoplasm of breast: Secondary | ICD-10-CM | POA: Diagnosis not present

## 2015-10-06 DIAGNOSIS — K591 Functional diarrhea: Secondary | ICD-10-CM | POA: Diagnosis not present

## 2015-10-06 DIAGNOSIS — E213 Hyperparathyroidism, unspecified: Secondary | ICD-10-CM | POA: Diagnosis not present

## 2015-10-06 DIAGNOSIS — Z932 Ileostomy status: Secondary | ICD-10-CM | POA: Diagnosis not present

## 2015-10-06 DIAGNOSIS — I10 Essential (primary) hypertension: Secondary | ICD-10-CM | POA: Diagnosis not present

## 2015-10-06 DIAGNOSIS — Z23 Encounter for immunization: Secondary | ICD-10-CM | POA: Diagnosis not present

## 2015-10-20 DIAGNOSIS — I10 Essential (primary) hypertension: Secondary | ICD-10-CM | POA: Diagnosis not present

## 2016-02-17 DIAGNOSIS — I10 Essential (primary) hypertension: Secondary | ICD-10-CM | POA: Diagnosis not present

## 2016-02-17 DIAGNOSIS — Z1389 Encounter for screening for other disorder: Secondary | ICD-10-CM | POA: Diagnosis not present

## 2016-02-17 DIAGNOSIS — M810 Age-related osteoporosis without current pathological fracture: Secondary | ICD-10-CM | POA: Diagnosis not present

## 2016-02-17 DIAGNOSIS — E213 Hyperparathyroidism, unspecified: Secondary | ICD-10-CM | POA: Diagnosis not present

## 2016-02-17 DIAGNOSIS — Z0000 Encounter for general adult medical examination without abnormal findings: Secondary | ICD-10-CM | POA: Diagnosis not present

## 2016-02-19 DIAGNOSIS — E213 Hyperparathyroidism, unspecified: Secondary | ICD-10-CM | POA: Diagnosis not present

## 2016-02-24 ENCOUNTER — Other Ambulatory Visit (HOSPITAL_COMMUNITY): Payer: Self-pay | Admitting: Internal Medicine

## 2016-02-24 DIAGNOSIS — K219 Gastro-esophageal reflux disease without esophagitis: Secondary | ICD-10-CM | POA: Diagnosis not present

## 2016-02-24 DIAGNOSIS — M810 Age-related osteoporosis without current pathological fracture: Secondary | ICD-10-CM | POA: Diagnosis not present

## 2016-02-24 DIAGNOSIS — E210 Primary hyperparathyroidism: Secondary | ICD-10-CM

## 2016-02-24 DIAGNOSIS — Z8781 Personal history of (healed) traumatic fracture: Secondary | ICD-10-CM | POA: Diagnosis not present

## 2016-02-24 DIAGNOSIS — Z8349 Family history of other endocrine, nutritional and metabolic diseases: Secondary | ICD-10-CM | POA: Diagnosis not present

## 2016-02-24 DIAGNOSIS — Z808 Family history of malignant neoplasm of other organs or systems: Secondary | ICD-10-CM | POA: Diagnosis not present

## 2016-02-24 DIAGNOSIS — Z92241 Personal history of systemic steroid therapy: Secondary | ICD-10-CM | POA: Diagnosis not present

## 2016-02-24 DIAGNOSIS — Z841 Family history of disorders of kidney and ureter: Secondary | ICD-10-CM | POA: Diagnosis not present

## 2016-02-26 DIAGNOSIS — E210 Primary hyperparathyroidism: Secondary | ICD-10-CM | POA: Diagnosis not present

## 2016-03-02 ENCOUNTER — Encounter (HOSPITAL_COMMUNITY)
Admission: RE | Admit: 2016-03-02 | Discharge: 2016-03-02 | Disposition: A | Discharge status: Home / Self Care | Payer: Medicare Other | Source: Ambulatory Visit | Attending: Internal Medicine | Admitting: Internal Medicine

## 2016-03-02 DIAGNOSIS — E210 Primary hyperparathyroidism: Secondary | ICD-10-CM

## 2016-03-02 MED ORDER — TECHNETIUM TC 99M SESTAMIBI GENERIC - CARDIOLITE
25.0000 | Freq: Once | INTRAVENOUS | Status: AC | PRN
  Administered 2016-03-02: 25 via INTRAVENOUS

## 2016-04-12 DIAGNOSIS — L821 Other seborrheic keratosis: Secondary | ICD-10-CM | POA: Diagnosis not present

## 2016-04-14 DIAGNOSIS — R002 Palpitations: Secondary | ICD-10-CM | POA: Diagnosis not present

## 2016-04-14 DIAGNOSIS — Z932 Ileostomy status: Secondary | ICD-10-CM | POA: Diagnosis not present

## 2016-04-14 DIAGNOSIS — Z92241 Personal history of systemic steroid therapy: Secondary | ICD-10-CM | POA: Diagnosis not present

## 2016-04-14 DIAGNOSIS — Z8349 Family history of other endocrine, nutritional and metabolic diseases: Secondary | ICD-10-CM | POA: Diagnosis not present

## 2016-04-14 DIAGNOSIS — Z841 Family history of disorders of kidney and ureter: Secondary | ICD-10-CM | POA: Diagnosis not present

## 2016-04-14 DIAGNOSIS — Z808 Family history of malignant neoplasm of other organs or systems: Secondary | ICD-10-CM | POA: Diagnosis not present

## 2016-04-14 DIAGNOSIS — M810 Age-related osteoporosis without current pathological fracture: Secondary | ICD-10-CM | POA: Diagnosis not present

## 2016-04-14 DIAGNOSIS — Z8781 Personal history of (healed) traumatic fracture: Secondary | ICD-10-CM | POA: Diagnosis not present

## 2016-04-14 DIAGNOSIS — I10 Essential (primary) hypertension: Secondary | ICD-10-CM | POA: Diagnosis not present

## 2016-04-14 DIAGNOSIS — E210 Primary hyperparathyroidism: Secondary | ICD-10-CM | POA: Diagnosis not present

## 2016-04-14 DIAGNOSIS — K219 Gastro-esophageal reflux disease without esophagitis: Secondary | ICD-10-CM | POA: Diagnosis not present

## 2016-05-06 ENCOUNTER — Ambulatory Visit (HOSPITAL_COMMUNITY): Payer: Medicare Other

## 2016-07-21 DIAGNOSIS — H52203 Unspecified astigmatism, bilateral: Secondary | ICD-10-CM | POA: Diagnosis not present

## 2016-07-21 DIAGNOSIS — H5203 Hypermetropia, bilateral: Secondary | ICD-10-CM | POA: Diagnosis not present

## 2016-07-21 DIAGNOSIS — H524 Presbyopia: Secondary | ICD-10-CM | POA: Diagnosis not present

## 2016-07-21 DIAGNOSIS — Z961 Presence of intraocular lens: Secondary | ICD-10-CM | POA: Diagnosis not present

## 2016-08-11 DIAGNOSIS — I10 Essential (primary) hypertension: Secondary | ICD-10-CM | POA: Diagnosis not present

## 2016-08-24 ENCOUNTER — Other Ambulatory Visit: Payer: Self-pay | Admitting: Geriatric Medicine

## 2016-08-24 DIAGNOSIS — Z1231 Encounter for screening mammogram for malignant neoplasm of breast: Secondary | ICD-10-CM

## 2016-09-30 ENCOUNTER — Ambulatory Visit
Admission: RE | Admit: 2016-09-30 | Discharge: 2016-09-30 | Disposition: A | Discharge status: Home / Self Care | Payer: Medicare Other | Source: Ambulatory Visit | Attending: Geriatric Medicine | Admitting: Geriatric Medicine

## 2016-09-30 DIAGNOSIS — Z1231 Encounter for screening mammogram for malignant neoplasm of breast: Secondary | ICD-10-CM

## 2016-10-11 DIAGNOSIS — I10 Essential (primary) hypertension: Secondary | ICD-10-CM | POA: Diagnosis not present

## 2016-10-11 DIAGNOSIS — Z23 Encounter for immunization: Secondary | ICD-10-CM | POA: Diagnosis not present

## 2016-10-11 DIAGNOSIS — M1310 Monoarthritis, not elsewhere classified, unspecified site: Secondary | ICD-10-CM | POA: Diagnosis not present

## 2016-10-18 DIAGNOSIS — Z808 Family history of malignant neoplasm of other organs or systems: Secondary | ICD-10-CM | POA: Diagnosis not present

## 2016-10-18 DIAGNOSIS — Z92241 Personal history of systemic steroid therapy: Secondary | ICD-10-CM | POA: Diagnosis not present

## 2016-10-18 DIAGNOSIS — Z8349 Family history of other endocrine, nutritional and metabolic diseases: Secondary | ICD-10-CM | POA: Diagnosis not present

## 2016-10-18 DIAGNOSIS — E210 Primary hyperparathyroidism: Secondary | ICD-10-CM | POA: Diagnosis not present

## 2016-10-18 DIAGNOSIS — K219 Gastro-esophageal reflux disease without esophagitis: Secondary | ICD-10-CM | POA: Diagnosis not present

## 2016-10-18 DIAGNOSIS — R5383 Other fatigue: Secondary | ICD-10-CM | POA: Diagnosis not present

## 2016-10-18 DIAGNOSIS — M810 Age-related osteoporosis without current pathological fracture: Secondary | ICD-10-CM | POA: Diagnosis not present

## 2016-10-18 DIAGNOSIS — Z8781 Personal history of (healed) traumatic fracture: Secondary | ICD-10-CM | POA: Diagnosis not present

## 2016-10-18 DIAGNOSIS — Z841 Family history of disorders of kidney and ureter: Secondary | ICD-10-CM | POA: Diagnosis not present

## 2016-11-02 DIAGNOSIS — I10 Essential (primary) hypertension: Secondary | ICD-10-CM | POA: Diagnosis not present

## 2016-11-02 DIAGNOSIS — E213 Hyperparathyroidism, unspecified: Secondary | ICD-10-CM | POA: Diagnosis not present

## 2016-11-19 DIAGNOSIS — Z8781 Personal history of (healed) traumatic fracture: Secondary | ICD-10-CM | POA: Diagnosis not present

## 2016-11-19 DIAGNOSIS — Z841 Family history of disorders of kidney and ureter: Secondary | ICD-10-CM | POA: Diagnosis not present

## 2016-11-19 DIAGNOSIS — E210 Primary hyperparathyroidism: Secondary | ICD-10-CM | POA: Diagnosis not present

## 2016-11-19 DIAGNOSIS — Z92241 Personal history of systemic steroid therapy: Secondary | ICD-10-CM | POA: Diagnosis not present

## 2016-11-19 DIAGNOSIS — K219 Gastro-esophageal reflux disease without esophagitis: Secondary | ICD-10-CM | POA: Diagnosis not present

## 2016-11-19 DIAGNOSIS — R5383 Other fatigue: Secondary | ICD-10-CM | POA: Diagnosis not present

## 2016-11-19 DIAGNOSIS — Z8349 Family history of other endocrine, nutritional and metabolic diseases: Secondary | ICD-10-CM | POA: Diagnosis not present

## 2016-11-19 DIAGNOSIS — Z808 Family history of malignant neoplasm of other organs or systems: Secondary | ICD-10-CM | POA: Diagnosis not present

## 2016-11-19 DIAGNOSIS — M810 Age-related osteoporosis without current pathological fracture: Secondary | ICD-10-CM | POA: Diagnosis not present

## 2016-11-22 DIAGNOSIS — I10 Essential (primary) hypertension: Secondary | ICD-10-CM | POA: Diagnosis not present

## 2016-11-22 DIAGNOSIS — E213 Hyperparathyroidism, unspecified: Secondary | ICD-10-CM | POA: Diagnosis not present

## 2016-11-26 DIAGNOSIS — E210 Primary hyperparathyroidism: Secondary | ICD-10-CM | POA: Diagnosis not present

## 2017-01-25 DIAGNOSIS — E213 Hyperparathyroidism, unspecified: Secondary | ICD-10-CM

## 2017-01-25 DIAGNOSIS — M810 Age-related osteoporosis without current pathological fracture: Secondary | ICD-10-CM

## 2017-01-25 HISTORY — DX: Hyperparathyroidism, unspecified: E21.3

## 2017-01-25 HISTORY — DX: Age-related osteoporosis without current pathological fracture: M81.0

## 2017-02-17 DIAGNOSIS — E213 Hyperparathyroidism, unspecified: Secondary | ICD-10-CM | POA: Diagnosis not present

## 2017-02-17 DIAGNOSIS — Z1389 Encounter for screening for other disorder: Secondary | ICD-10-CM | POA: Diagnosis not present

## 2017-02-17 DIAGNOSIS — I10 Essential (primary) hypertension: Secondary | ICD-10-CM | POA: Diagnosis not present

## 2017-02-17 DIAGNOSIS — Z0000 Encounter for general adult medical examination without abnormal findings: Secondary | ICD-10-CM | POA: Diagnosis not present

## 2017-04-20 DIAGNOSIS — I10 Essential (primary) hypertension: Secondary | ICD-10-CM | POA: Diagnosis not present

## 2017-04-20 DIAGNOSIS — E213 Hyperparathyroidism, unspecified: Secondary | ICD-10-CM | POA: Diagnosis not present

## 2017-04-20 DIAGNOSIS — Z79899 Other long term (current) drug therapy: Secondary | ICD-10-CM | POA: Diagnosis not present

## 2017-05-23 ENCOUNTER — Other Ambulatory Visit: Payer: Self-pay | Admitting: Internal Medicine

## 2017-05-23 DIAGNOSIS — K219 Gastro-esophageal reflux disease without esophagitis: Secondary | ICD-10-CM | POA: Diagnosis not present

## 2017-05-23 DIAGNOSIS — R946 Abnormal results of thyroid function studies: Secondary | ICD-10-CM | POA: Diagnosis not present

## 2017-05-23 DIAGNOSIS — Z92241 Personal history of systemic steroid therapy: Secondary | ICD-10-CM | POA: Diagnosis not present

## 2017-05-23 DIAGNOSIS — M810 Age-related osteoporosis without current pathological fracture: Secondary | ICD-10-CM | POA: Diagnosis not present

## 2017-05-23 DIAGNOSIS — E210 Primary hyperparathyroidism: Secondary | ICD-10-CM | POA: Diagnosis not present

## 2017-05-23 DIAGNOSIS — Z841 Family history of disorders of kidney and ureter: Secondary | ICD-10-CM | POA: Diagnosis not present

## 2017-05-23 DIAGNOSIS — Z8349 Family history of other endocrine, nutritional and metabolic diseases: Secondary | ICD-10-CM | POA: Diagnosis not present

## 2017-05-23 DIAGNOSIS — Z808 Family history of malignant neoplasm of other organs or systems: Secondary | ICD-10-CM | POA: Diagnosis not present

## 2017-05-23 DIAGNOSIS — Z8781 Personal history of (healed) traumatic fracture: Secondary | ICD-10-CM | POA: Diagnosis not present

## 2017-05-30 ENCOUNTER — Ambulatory Visit
Admission: RE | Admit: 2017-05-30 | Discharge: 2017-05-30 | Disposition: A | Discharge status: Home / Self Care | Payer: Medicare Other | Source: Ambulatory Visit | Attending: Internal Medicine | Admitting: Internal Medicine

## 2017-05-30 DIAGNOSIS — M810 Age-related osteoporosis without current pathological fracture: Secondary | ICD-10-CM | POA: Diagnosis not present

## 2017-05-30 DIAGNOSIS — E210 Primary hyperparathyroidism: Secondary | ICD-10-CM | POA: Diagnosis not present

## 2017-05-30 DIAGNOSIS — E042 Nontoxic multinodular goiter: Secondary | ICD-10-CM | POA: Diagnosis not present

## 2017-07-04 DIAGNOSIS — Z8781 Personal history of (healed) traumatic fracture: Secondary | ICD-10-CM | POA: Diagnosis not present

## 2017-07-04 DIAGNOSIS — E042 Nontoxic multinodular goiter: Secondary | ICD-10-CM | POA: Diagnosis not present

## 2017-07-04 DIAGNOSIS — Z841 Family history of disorders of kidney and ureter: Secondary | ICD-10-CM | POA: Diagnosis not present

## 2017-07-04 DIAGNOSIS — Z92241 Personal history of systemic steroid therapy: Secondary | ICD-10-CM | POA: Diagnosis not present

## 2017-07-04 DIAGNOSIS — Z808 Family history of malignant neoplasm of other organs or systems: Secondary | ICD-10-CM | POA: Diagnosis not present

## 2017-07-04 DIAGNOSIS — K219 Gastro-esophageal reflux disease without esophagitis: Secondary | ICD-10-CM | POA: Diagnosis not present

## 2017-07-04 DIAGNOSIS — Z8349 Family history of other endocrine, nutritional and metabolic diseases: Secondary | ICD-10-CM | POA: Diagnosis not present

## 2017-07-04 DIAGNOSIS — M810 Age-related osteoporosis without current pathological fracture: Secondary | ICD-10-CM | POA: Diagnosis not present

## 2017-07-05 DIAGNOSIS — I10 Essential (primary) hypertension: Secondary | ICD-10-CM | POA: Diagnosis not present

## 2017-07-05 DIAGNOSIS — E213 Hyperparathyroidism, unspecified: Secondary | ICD-10-CM | POA: Diagnosis not present

## 2017-07-05 DIAGNOSIS — M109 Gout, unspecified: Secondary | ICD-10-CM | POA: Diagnosis not present

## 2017-07-12 DIAGNOSIS — M810 Age-related osteoporosis without current pathological fracture: Secondary | ICD-10-CM | POA: Diagnosis not present

## 2017-08-08 ENCOUNTER — Ambulatory Visit (HOSPITAL_COMMUNITY)
Admission: RE | Admit: 2017-08-08 | Discharge: 2017-08-08 | Disposition: A | Discharge status: Home / Self Care | Payer: Medicare Other | Source: Ambulatory Visit | Attending: Internal Medicine | Admitting: Internal Medicine

## 2017-08-08 ENCOUNTER — Encounter (HOSPITAL_COMMUNITY): Payer: Self-pay

## 2017-08-08 DIAGNOSIS — K219 Gastro-esophageal reflux disease without esophagitis: Secondary | ICD-10-CM | POA: Insufficient documentation

## 2017-08-08 DIAGNOSIS — M810 Age-related osteoporosis without current pathological fracture: Secondary | ICD-10-CM | POA: Insufficient documentation

## 2017-08-08 HISTORY — DX: Hyperparathyroidism, unspecified: E21.3

## 2017-08-08 HISTORY — DX: Ileostomy status: Z93.2

## 2017-08-08 HISTORY — DX: Age-related osteoporosis without current pathological fracture: M81.0

## 2017-08-08 MED ORDER — ZOLEDRONIC ACID 5 MG/100ML IV SOLN
5.0000 mg | Freq: Once | INTRAVENOUS | Status: AC
  Administered 2017-08-08: 5 mg via INTRAVENOUS
  Filled 2017-08-08: qty 100

## 2017-08-08 MED ORDER — SODIUM CHLORIDE 0.9 % IV SOLN
Freq: Once | INTRAVENOUS | Status: AC
  Administered 2017-08-08: 13:00:00 via INTRAVENOUS

## 2017-08-08 NOTE — Discharge Instructions (Signed)
Zoledronic Acid injection (Paget's Disease, Osteoporosis)/ Reclast °What is this medicine? °ZOLEDRONIC ACID (ZOE le dron ik AS id) lowers the amount of calcium loss from bone. It is used to treat Paget's disease and osteoporosis in women. °This medicine may be used for other purposes; ask your health care provider or pharmacist if you have questions. °COMMON BRAND NAME(S): Reclast, Zometa °What should I tell my health care provider before I take this medicine? °They need to know if you have any of these conditions: °-aspirin-sensitive asthma °-cancer, especially if you are receiving medicines used to treat cancer °-dental disease or wear dentures °-infection °-kidney disease °-low levels of calcium in the blood °-past surgery on the parathyroid gland or intestines °-receiving corticosteroids like dexamethasone or prednisone °-an unusual or allergic reaction to zoledronic acid, other medicines, foods, dyes, or preservatives °-pregnant or trying to get pregnant °-breast-feeding °How should I use this medicine? °This medicine is for infusion into a vein. It is given by a health care professional in a hospital or clinic setting. °Talk to your pediatrician regarding the use of this medicine in children. This medicine is not approved for use in children. °Overdosage: If you think you have taken too much of this medicine contact a poison control center or emergency room at once. °NOTE: This medicine is only for you. Do not share this medicine with others. °What if I miss a dose? °It is important not to miss your dose. Call your doctor or health care professional if you are unable to keep an appointment. °What may interact with this medicine? °-certain antibiotics given by injection °-NSAIDs, medicines for pain and inflammation, like ibuprofen or naproxen °-some diuretics like bumetanide, furosemide °-teriparatide °This list may not describe all possible interactions. Give your health care provider a list of all the  medicines, herbs, non-prescription drugs, or dietary supplements you use. Also tell them if you smoke, drink alcohol, or use illegal drugs. Some items may interact with your medicine. °What should I watch for while using this medicine? °Visit your doctor or health care professional for regular checkups. It may be some time before you see the benefit from this medicine. Do not stop taking your medicine unless your doctor tells you to. Your doctor may order blood tests or other tests to see how you are doing. °Women should inform their doctor if they wish to become pregnant or think they might be pregnant. There is a potential for serious side effects to an unborn child. Talk to your health care professional or pharmacist for more information. °You should make sure that you get enough calcium and vitamin D while you are taking this medicine. Discuss the foods you eat and the vitamins you take with your health care professional. °Some people who take this medicine have severe bone, joint, and/or muscle pain. This medicine may also increase your risk for jaw problems or a broken thigh bone. Tell your doctor right away if you have severe pain in your jaw, bones, joints, or muscles. Tell your doctor if you have any pain that does not go away or that gets worse. °Tell your dentist and dental surgeon that you are taking this medicine. You should not have major dental surgery while on this medicine. See your dentist to have a dental exam and fix any dental problems before starting this medicine. Take good care of your teeth while on this medicine. Make sure you see your dentist for regular follow-up appointments. °What side effects may I notice from receiving this   medicine? °Side effects that you should report to your doctor or health care professional as soon as possible: °-allergic reactions like skin rash, itching or hives, swelling of the face, lips, or tongue °-anxiety, confusion, or depression °-breathing  problems °-changes in vision °-eye pain °-feeling faint or lightheaded, falls °-jaw pain, especially after dental work °-mouth sores °-muscle cramps, stiffness, or weakness °-redness, blistering, peeling or loosening of the skin, including inside the mouth °-trouble passing urine or change in the amount of urine °Side effects that usually do not require medical attention (report to your doctor or health care professional if they continue or are bothersome): °-bone, joint, or muscle pain °-constipation °-diarrhea °-fever °-hair loss °-irritation at site where injected °-loss of appetite °-nausea, vomiting °-stomach upset °-trouble sleeping °-trouble swallowing °-weak or tired °This list may not describe all possible side effects. Call your doctor for medical advice about side effects. You may report side effects to FDA at 1-800-FDA-1088. °Where should I keep my medicine? °This drug is given in a hospital or clinic and will not be stored at home. °NOTE: This sheet is a summary. It may not cover all possible information. If you have questions about this medicine, talk to your doctor, pharmacist, or health care provider. °© 2018 [employer] (2013-06-09 14:19:57) ° °

## 2017-08-22 ENCOUNTER — Other Ambulatory Visit: Payer: Self-pay | Admitting: Geriatric Medicine

## 2017-08-22 DIAGNOSIS — Z1231 Encounter for screening mammogram for malignant neoplasm of breast: Secondary | ICD-10-CM

## 2017-08-23 DIAGNOSIS — Z79899 Other long term (current) drug therapy: Secondary | ICD-10-CM | POA: Diagnosis not present

## 2017-08-23 DIAGNOSIS — M109 Gout, unspecified: Secondary | ICD-10-CM | POA: Diagnosis not present

## 2017-08-23 DIAGNOSIS — E213 Hyperparathyroidism, unspecified: Secondary | ICD-10-CM | POA: Diagnosis not present

## 2017-08-23 DIAGNOSIS — I10 Essential (primary) hypertension: Secondary | ICD-10-CM | POA: Diagnosis not present

## 2017-09-20 DIAGNOSIS — Z79899 Other long term (current) drug therapy: Secondary | ICD-10-CM | POA: Diagnosis not present

## 2017-10-03 ENCOUNTER — Ambulatory Visit
Admission: RE | Admit: 2017-10-03 | Discharge: 2017-10-03 | Disposition: A | Discharge status: Home / Self Care | Payer: Medicare Other | Source: Ambulatory Visit | Attending: Geriatric Medicine | Admitting: Geriatric Medicine

## 2017-10-03 DIAGNOSIS — Z1231 Encounter for screening mammogram for malignant neoplasm of breast: Secondary | ICD-10-CM

## 2017-10-12 DIAGNOSIS — H26492 Other secondary cataract, left eye: Secondary | ICD-10-CM | POA: Diagnosis not present

## 2017-10-12 DIAGNOSIS — Z961 Presence of intraocular lens: Secondary | ICD-10-CM | POA: Diagnosis not present

## 2017-11-03 DIAGNOSIS — Z23 Encounter for immunization: Secondary | ICD-10-CM | POA: Diagnosis not present

## 2018-01-03 DIAGNOSIS — R195 Other fecal abnormalities: Secondary | ICD-10-CM | POA: Diagnosis not present

## 2018-01-03 DIAGNOSIS — E042 Nontoxic multinodular goiter: Secondary | ICD-10-CM | POA: Diagnosis not present

## 2018-01-03 DIAGNOSIS — E871 Hypo-osmolality and hyponatremia: Secondary | ICD-10-CM | POA: Diagnosis not present

## 2018-01-03 DIAGNOSIS — M810 Age-related osteoporosis without current pathological fracture: Secondary | ICD-10-CM | POA: Diagnosis not present

## 2018-01-03 DIAGNOSIS — Z8781 Personal history of (healed) traumatic fracture: Secondary | ICD-10-CM | POA: Diagnosis not present

## 2018-01-03 DIAGNOSIS — K219 Gastro-esophageal reflux disease without esophagitis: Secondary | ICD-10-CM | POA: Diagnosis not present

## 2018-01-03 DIAGNOSIS — Z808 Family history of malignant neoplasm of other organs or systems: Secondary | ICD-10-CM | POA: Diagnosis not present

## 2018-01-03 DIAGNOSIS — Z8349 Family history of other endocrine, nutritional and metabolic diseases: Secondary | ICD-10-CM | POA: Diagnosis not present

## 2018-01-03 DIAGNOSIS — Z841 Family history of disorders of kidney and ureter: Secondary | ICD-10-CM | POA: Diagnosis not present

## 2018-01-11 DIAGNOSIS — E871 Hypo-osmolality and hyponatremia: Secondary | ICD-10-CM | POA: Diagnosis not present

## 2018-01-23 ENCOUNTER — Other Ambulatory Visit: Payer: Self-pay | Admitting: Internal Medicine

## 2018-01-23 ENCOUNTER — Ambulatory Visit
Admission: RE | Admit: 2018-01-23 | Discharge: 2018-01-23 | Disposition: A | Discharge status: Home / Self Care | Payer: Medicare Other | Source: Ambulatory Visit | Attending: Internal Medicine | Admitting: Internal Medicine

## 2018-01-23 ENCOUNTER — Other Ambulatory Visit: Payer: Self-pay | Admitting: Critical Care Medicine

## 2018-01-23 DIAGNOSIS — M25552 Pain in left hip: Secondary | ICD-10-CM

## 2018-01-23 DIAGNOSIS — M1612 Unilateral primary osteoarthritis, left hip: Secondary | ICD-10-CM | POA: Diagnosis not present

## 2018-01-25 DIAGNOSIS — S76319A Strain of muscle, fascia and tendon of the posterior muscle group at thigh level, unspecified thigh, initial encounter: Secondary | ICD-10-CM | POA: Diagnosis not present

## 2018-02-14 DIAGNOSIS — M5432 Sciatica, left side: Secondary | ICD-10-CM | POA: Diagnosis not present

## 2018-02-23 DIAGNOSIS — Z1389 Encounter for screening for other disorder: Secondary | ICD-10-CM | POA: Diagnosis not present

## 2018-02-23 DIAGNOSIS — M5432 Sciatica, left side: Secondary | ICD-10-CM | POA: Diagnosis not present

## 2018-02-23 DIAGNOSIS — Z0000 Encounter for general adult medical examination without abnormal findings: Secondary | ICD-10-CM | POA: Diagnosis not present

## 2018-02-23 DIAGNOSIS — E213 Hyperparathyroidism, unspecified: Secondary | ICD-10-CM | POA: Diagnosis not present

## 2018-02-23 DIAGNOSIS — I10 Essential (primary) hypertension: Secondary | ICD-10-CM | POA: Diagnosis not present

## 2018-02-23 DIAGNOSIS — Z932 Ileostomy status: Secondary | ICD-10-CM | POA: Diagnosis not present

## 2018-02-23 DIAGNOSIS — E222 Syndrome of inappropriate secretion of antidiuretic hormone: Secondary | ICD-10-CM | POA: Diagnosis not present

## 2018-03-20 DIAGNOSIS — I10 Essential (primary) hypertension: Secondary | ICD-10-CM | POA: Diagnosis not present

## 2018-03-20 DIAGNOSIS — M5432 Sciatica, left side: Secondary | ICD-10-CM | POA: Diagnosis not present

## 2018-03-30 ENCOUNTER — Encounter: Payer: Self-pay | Admitting: Physical Therapy

## 2018-03-30 ENCOUNTER — Other Ambulatory Visit: Payer: Self-pay

## 2018-03-30 ENCOUNTER — Ambulatory Visit: Payer: Medicare Other | Attending: Geriatric Medicine | Admitting: Physical Therapy

## 2018-03-30 DIAGNOSIS — R2689 Other abnormalities of gait and mobility: Secondary | ICD-10-CM | POA: Insufficient documentation

## 2018-03-30 DIAGNOSIS — M25552 Pain in left hip: Secondary | ICD-10-CM | POA: Diagnosis not present

## 2018-03-31 ENCOUNTER — Encounter: Payer: Self-pay | Admitting: Physical Therapy

## 2018-03-31 NOTE — Therapy (Signed)
[place], [place], [address] Phone: [phone]   Fax:  [phone]  Physical Therapy Evaluation  Patient Details  Name: Sandy Trujillo MRN: [id number] Date of Birth: 10-Feb-1932 Referring Provider (PT): Dr Lajean Manes    Encounter Date: 03/30/2018  PT End of Session - 03/30/18 1350    Visit Number  1    Number of Visits  12    Date for PT Re-Evaluation  05/11/18    Authorization Type  [employer] progress note on 10th visit.     PT Start Time  1330    PT Stop Time  1415    PT Time Calculation (min)  45 min    Activity Tolerance  Patient tolerated treatment well    Behavior During Therapy  WFL for tasks assessed/performed       Past Medical History:  Diagnosis Date  . Hyperparathyroidism ([place]) 2019  . Ileostomy in place [facility])   . Osteoporosis 2019  . rectal ca 2007   surg only    Past Surgical History:  Procedure Laterality Date  . ABDOMINAL HYSTERECTOMY    . BREAST EXCISIONAL BIOPSY Bilateral    benign  . COLON SURGERY  2007    There were no vitals filed for this visit.   Subjective Assessment - 03/30/18 1330    Subjective  Patient had an onset of left hip and back pain on December 1st. She is very active. She has a stretching and strengthening program. She has had pain like this before but was able to improve the pain using yoga stretches. At this time she feels like the stretches are making it painful.     Limitations  Standing    Diagnostic tests  X-ray    Patient Stated Goals  to have less pain in the hip    Currently in Pain?  Yes    Pain Score  4     Pain Location  Hip    Pain Orientation  Left    Pain Descriptors / Indicators  Aching    Pain Type  Chronic pain    Pain Onset  More than a month ago    Pain Frequency  Constant    Aggravating Factors   standing, walking , steps     Pain Relieving Factors  prednisone has heled     Effect of Pain on Daily Activities  difficulty  with normaly activi lifestyle     Multiple Pain Sites  No         [facility] PT Assessment - 03/31/18 0001      Assessment   Medical Diagnosis  Left Hip Pain     Referring Provider (PT)  Dr Lajean Manes     Onset Date/Surgical Date  --   December 1st 2019    Hand Dominance  Right    Next MD Visit  End of March     Prior Therapy  n      Precautions   Precautions  None      Restrictions   Weight Bearing Restrictions  No      Balance Screen   Has the patient fallen in the past 6 months  No    Has the patient had a decrease in activity level because of a fear of falling?   No    Is the patient reluctant to leave their home because of a fear of falling?   No      Home Environment  Additional Comments  10 steps down to a level then 10 more steps       Prior Function   Level of Independence  Independent      Cognition   Overall Cognitive Status  Within Functional Limits for tasks assessed    Attention  Focused    Focused Attention  Appears intact    Memory  Appears intact    Awareness  Appears intact    Problem Solving  Appears intact      Observation/Other Assessments   Focus on Therapeutic Outcomes (FOTO)   71% limitation       Sensation   Additional Comments  Pain can radiate down the left lateral leg       Coordination   Gross Motor Movements are Fluid and Coordinated  Yes    Fine Motor Movements are Fluid and Coordinated  Yes      AROM   Lumbar Flexion  60    Lumbar Extension  18    Lumbar - Right Side Bend  no limitation     Lumbar - Left Side Bend  pain with lef  side bend     Lumbar - Right Rotation  no limit     Lumbar - Left Rotation  no limit       PROM   Overall PROM Comments  pain with end range flexion and IR       Strength   Right/Left Hip  Left    Left Hip Flexion  4/5    Left Hip ABduction  4+/5    Left Hip ADduction  5/5      Palpation   Palpation comment  tendenress to palpation in the trochanter area and IT band       Special Tests    Other special tests  left scour test (+) Left FABER test (+) SLR test (-)       Ambulation/Gait   Gait Comments  lateral movement noted. Improved with heel wedge; decreased hip flexion on the left;                 Objective measurements completed on examination: See above findings.      [facility] Adult PT Treatment/Exercise - 03/31/18 0001      Exercises   Exercises  Knee/Hip      Knee/Hip Exercises: Stretches   Other Knee/Hip Stretches  lateral trunk rotation x10; ttennis ball to glutes;     Other Knee/Hip Stretches  reviewed piriformis stretch 2x20 sec hold              PT Education - 03/30/18 1349    Education Details  HEP , symptom management; reviewed a more beneficial bike seat     Person(s) Educated  Patient    Methods  Explanation;Demonstration;Tactile cues    Comprehension  Verbalized understanding;Returned demonstration;Verbal cues required;Tactile cues required       PT Short Term Goals - 03/31/18 1008      PT SHORT TERM GOAL #1   Title  Patient will demonstrate 5/5 gross bilteral strength     Time  3    Period  Weeks    Status  New    Target Date  04/21/18      PT SHORT TERM GOAL #2   Title  Patient will report 2/10 pain at woirst with walking     Time  3    Period  Weeks    Status  New    Target Date  04/21/18  PT SHORT TERM GOAL #3   Title  Patient will be indepednet with modified exercise program for her     Time  3    Period  Weeks    Status  New    Target Date  04/21/18        PT Long Term Goals - 03/31/18 1009      PT LONG TERM GOAL #1   Title  Patient will return to her exercise program without pain     Time  6    Period  Weeks    Status  New    Target Date  05/12/18      PT LONG TERM GOAL #2   Title  Patient will go up/down steps without pain     Time  6    Period  Weeks    Status  New    Target Date  05/12/18      PT LONG TERM GOAL #3   Title  Patient will ambualte 3000' without AD or pain in order to perfrom  ADL's     Time  6    Period  Weeks    Status  New    Target Date  05/12/18             Plan - 03/31/18 0757    Clinical Impression Statement  Patient is an 85 year old female with left hip pain. Signs and symptoms are consitent with piriformis syndrome and likely hip OA. She has a positive hip scour test and FABER test. She good lumbar motion. She has tenderness around her torchanter and pain down into his IT band. She is very active and is already working on stretches and exercises at home. She appears to have a loeg length descrepency. Therapy trialed a heel lift to see if that can correct the  left leg. She would benefit from skilled therapy for manual therapy to her left hip and pelvis andto update her strengthening and stretching program.     Personal Factors and Comorbidities  Age;Comorbidity 1    Comorbidities  OA in the hip     Examination-Activity Limitations  Stand;Stairs;Squat    Examination-Participation Restrictions  Shop;Community Activity    Stability/Clinical Decision Making  Stable/Uncomplicated   decreasing painsince she has taken the prednisone    Clinical Decision Making  Low    Rehab Potential  Good    PT Frequency  2x / week    PT Duration  6 weeks    PT Treatment/Interventions  ADLs/Self Care Home Management;Cryotherapy;Electrical Stimulation;Iontophoresis 4mg /ml Dexamethasone;Moist Heat;Ultrasound;DME Instruction;Gait training;Stair training;Functional mobility training;Therapeutic activities;Therapeutic exercise;Neuromuscular re-education;Patient/family education;Manual techniques;Passive range of motion;Dry needling;Taping    PT Next Visit Plan  elavated left hip. Begin LAD ; and mobilzations for anterior hip rotation; begin pelvic stabilization exercises; consider supine march and clamshell; reviewe pelvic tilt; porgress to bridge when able     PT Home Exercise Plan  piriformis; lower trunkrotation; tennis ball. Advised to continue with PPT at home.      Consulted and Agree with Plan of Care  Patient       Patient will benefit from skilled therapeutic intervention in order to improve the following deficits and impairments:     Visit Diagnosis: Pain in left hip  Other abnormalities of gait and mobility     Problem List Patient Active Problem List   Diagnosis Date Noted  . HYPERLIPIDEMIA 04/18/2007  . ALLERGIC RHINITIS 04/18/2007  . PULMONARY NODULE 04/18/2007  . CARCINOMA, RECTUM 04/17/2007  .  ANXIETY 04/17/2007  . HYPERTENSION 04/17/2007  . COLITIS, ULCERATIVE 04/17/2007    Carney Living PT DPT  03/31/2018, 10:22 AM  [facility] [address] [place], [place], [address] Phone: [phone]   Fax:  [phone]  Name: Sandy Trujillo MRN: [id number] Date of Birth: 1932/04/04

## 2018-04-04 ENCOUNTER — Encounter: Payer: Self-pay | Admitting: Physical Therapy

## 2018-04-04 ENCOUNTER — Ambulatory Visit: Payer: Medicare Other | Admitting: Physical Therapy

## 2018-04-04 DIAGNOSIS — R2689 Other abnormalities of gait and mobility: Secondary | ICD-10-CM | POA: Diagnosis not present

## 2018-04-04 DIAGNOSIS — M25552 Pain in left hip: Secondary | ICD-10-CM | POA: Diagnosis not present

## 2018-04-05 ENCOUNTER — Encounter: Payer: Self-pay | Admitting: Physical Therapy

## 2018-04-05 NOTE — Therapy (Signed)
[place], [place], [address] Phone: [phone]   Fax:  [phone]  Physical Therapy Treatment  Patient Details  Name: Sandy Trujillo MRN: [id number] Date of Birth: Nov 08, 1932 Referring Provider (PT): Dr Lajean Manes    Encounter Date: 04/04/2018  PT End of Session - 04/05/18 0737    Visit Number  2    Number of Visits  12    Date for PT Re-Evaluation  05/11/18    Authorization Type  [employer] progress note on 10th visit.     PT Start Time  1500    PT Stop Time  1542    PT Time Calculation (min)  42 min    Activity Tolerance  Patient tolerated treatment well    Behavior During Therapy  WFL for tasks assessed/performed       Past Medical History:  Diagnosis Date  . Hyperparathyroidism ([place]) 2019  . Ileostomy in place [facility])   . Osteoporosis 2019  . rectal ca 2007   surg only    Past Surgical History:  Procedure Laterality Date  . ABDOMINAL HYSTERECTOMY    . BREAST EXCISIONAL BIOPSY Bilateral    benign  . COLON SURGERY  2007    There were no vitals filed for this visit.  Subjective Assessment - 04/04/18 1507    Subjective  Patient reports her hip pain has gone up a bit. She feels like her exercises are making her hip a bit worse. She feels like she is pressing too hard with ball.     Limitations  Standing    Diagnostic tests  X-ray    Patient Stated Goals  to have less pain in the hip    Currently in Pain?  Yes    Pain Score  5     Pain Location  Hip    Pain Orientation  Right    Pain Descriptors / Indicators  Aching    Pain Type  Chronic pain    Pain Onset  More than a month ago    Pain Frequency  Constant    Aggravating Factors   standing and walking     Pain Relieving Factors  tylenol     Effect of Pain on Daily Activities  difficulty walking                        [facility] Adult PT Treatment/Exercise - 04/05/18 0001      Knee/Hip Exercises: Stretches   Other  Knee/Hip Stretches  lateral trunk rotation x10; ttennis ball to glutes;     Other Knee/Hip Stretches  reviewed piriformis stretch 2x20 sec hold       Knee/Hip Exercises: Supine   Bridges Limitations  2x7     Other Supine Knee/Hip Exercises  supine march with TA mild pain at the end of set x10     Other Supine Knee/Hip Exercises  hip abduction with band 2x10 yellow       Manual Therapy   Manual Therapy  Soft tissue mobilization;Manual Traction    Manual therapy comments  Manual piriformis strethcing / Hip ER IR stretching     Soft tissue mobilization  tiger tail to IT band and lateral gluteal    Manual Traction  LAD Grade 3-5 occilations x6 minutes              PT Education - 04/05/18 0736    Education Details  , symptom mangement, how to use HEP  Person(s) Educated  Patient    Methods  Demonstration;Explanation;Tactile cues;Verbal cues    Comprehension  Verbalized understanding;Returned demonstration;Verbal cues required;Tactile cues required       PT Short Term Goals - 04/05/18 0745      PT SHORT TERM GOAL #1   Title  Patient will demonstrate 5/5 gross bilteral strength     Time  3    Period  Weeks    Status  On-going      PT SHORT TERM GOAL #2   Title  Patient will report 2/10 pain at woirst with walking     Time  3    Period  Weeks    Status  On-going      PT SHORT TERM GOAL #3   Title  Patient will be indepednet with modified exercise program for her     Time  3    Period  Weeks    Status  On-going        PT Long Term Goals - 03/31/18 1009      PT LONG TERM GOAL #1   Title  Patient will return to her exercise program without pain     Time  6    Period  Weeks    Status  New    Target Date  05/12/18      PT LONG TERM GOAL #2   Title  Patient will go up/down steps without pain     Time  6    Period  Weeks    Status  New    Target Date  05/12/18      PT LONG TERM GOAL #3   Title  Patient will ambualte 3000' without AD or pain in order to perfrom  ADL's     Time  6    Period  Weeks    Status  New    Target Date  05/12/18            Plan - 04/05/18 0738    Clinical Impression Statement  Therapy focused on manual therapy to the hip. She reported improved pain with LAD and soft tissue mobilization. Therapy also added light exercises for her HEP. She had no increase in pain with exercises but reported feeling fatigued after treatment. She was advised to try her stretches when she is feeling sore.     Personal Factors and Comorbidities  Age;Comorbidity 1    Comorbidities  OA in the hip     Examination-Activity Limitations  Stand;Stairs;Squat    Examination-Participation Restrictions  Shop;Community Activity    Stability/Clinical Decision Making  Stable/Uncomplicated    Clinical Decision Making  Low    Rehab Potential  Good    PT Frequency  2x / week    PT Duration  6 weeks    PT Treatment/Interventions  ADLs/Self Care Home Management;Cryotherapy;Electrical Stimulation;Iontophoresis 4mg /ml Dexamethasone;Moist Heat;Ultrasound;DME Instruction;Gait training;Stair training;Functional mobility training;Therapeutic activities;Therapeutic exercise;Neuromuscular re-education;Patient/family education;Manual techniques;Passive range of motion;Dry needling;Taping    PT Next Visit Plan  elavated left hip. Begin LAD ; and mobilzations for anterior hip rotation; begin pelvic stabilization exercises; consider supine march and clamshell; reviewe pelvic tilt; porgress to bridge when able     PT Home Exercise Plan  piriformis; lower trunkrotation; tennis ball. Advised to continue with PPT at home.     Consulted and Agree with Plan of Care  Patient       Patient will benefit from skilled therapeutic intervention in order to improve the following deficits and impairments:  Abnormal gait,  Pain, Decreased activity tolerance, Decreased endurance, Decreased range of motion, Decreased strength, Postural dysfunction, Decreased mobility  Visit  Diagnosis: Pain in left hip  Other abnormalities of gait and mobility     Problem List Patient Active Problem List   Diagnosis Date Noted  . HYPERLIPIDEMIA 04/18/2007  . ALLERGIC RHINITIS 04/18/2007  . PULMONARY NODULE 04/18/2007  . CARCINOMA, RECTUM 04/17/2007  . ANXIETY 04/17/2007  . HYPERTENSION 04/17/2007  . COLITIS, ULCERATIVE 04/17/2007    Carney Living PT DPT  04/05/2018, 7:51 AM  [facility] [address] [place], [place], [address] Phone: [phone]   Fax:  [phone]  Name: PATICIA MOSTER MRN: [id number] Date of Birth: Dec 09, 1932

## 2018-04-11 ENCOUNTER — Ambulatory Visit: Payer: Medicare Other | Admitting: Physical Therapy

## 2018-04-18 ENCOUNTER — Ambulatory Visit: Payer: Medicare Other | Admitting: Physical Therapy

## 2018-04-25 ENCOUNTER — Ambulatory Visit: Payer: Medicare Other | Admitting: Physical Therapy

## 2018-05-03 ENCOUNTER — Telehealth: Payer: Self-pay | Admitting: Physical Therapy

## 2018-05-03 NOTE — Telephone Encounter (Signed)
Sandy Trujillo was contacted today regarding the temporary reduction of OP Rehab Services due to concerns for community transmission of Covid-19.  Therapy left a voicemail.   Therapist advised the patient to continue to perform their HEP and advised to call with questions.   The patient was offered  continuation in their POC by using methods such as an e-visit, virtual check in, or telehealth visit. She was advised to call if that was something that interested her.    Outpatient Rehabilitation Services will follow up with this client when we are able to safely resume care at the [facility] in person.   Patient is aware we can be reached by telephone during limited business hours in the meantime.

## 2018-05-04 DIAGNOSIS — I10 Essential (primary) hypertension: Secondary | ICD-10-CM | POA: Diagnosis not present

## 2018-06-13 ENCOUNTER — Ambulatory Visit: Payer: Medicare Other | Admitting: Physical Therapy

## 2018-06-16 ENCOUNTER — Ambulatory Visit: Payer: Medicare Other | Attending: Geriatric Medicine | Admitting: Physical Therapy

## 2018-06-16 DIAGNOSIS — R2689 Other abnormalities of gait and mobility: Secondary | ICD-10-CM

## 2018-06-16 DIAGNOSIS — M25552 Pain in left hip: Secondary | ICD-10-CM | POA: Diagnosis not present

## 2018-06-16 NOTE — Therapy (Signed)
[place], [place], [address] Phone: [phone]   Fax:  [phone]  Physical Therapy Treatment/Telehealth RE-eval,Recert  I connected with  Sandy Trujillo on 06/16/18 by a video enabled telemedicine application and verified that I am speaking with the correct person using two identifiers.   I discussed the limitations of evaluation and management by telemedicine. The patient expressed understanding and agreed to proceed.    Patient Details  Name: Sandy Trujillo MRN: [id number] Date of Birth: [birth year] Referring Provider (PT): Dr Lajean Manes    Encounter Date: 06/16/2018  PT End of Session - 06/16/18 1202    Visit Number  3    Number of Visits  12    Date for PT Re-Evaluation  07/28/18    Authorization Type  Medicare progress note on 10th visit.     PT Start Time  1100    PT Stop Time  1149    PT Time Calculation (min)  49 min    Activity Tolerance  Patient tolerated treatment well    Behavior During Therapy  WFL for tasks assessed/performed       Past Medical History:  Diagnosis Date  . Hyperparathyroidism ([place]) 2019  . Ileostomy in place [facility])   . Osteoporosis 2019  . rectal ca 2007   surg only    Past Surgical History:  Procedure Laterality Date  . ABDOMINAL HYSTERECTOMY    . BREAST EXCISIONAL BIOPSY Bilateral    benign  . COLON SURGERY  2007    There were no vitals filed for this visit.  Subjective Assessment - 06/16/18 1154    Subjective  Pt relays her Lt hip continues to bother her and is worse first thing in the morning. She says overall stairs are a little easier for her. She says tyelenol has been helping wiht apin    Limitations  Standing    Diagnostic tests  X-ray    Patient Stated Goals  to have less pain in the hip    Currently in Pain?  Yes    Pain Score  5     Pain Location  Hip    Pain Orientation  Right    Pain Descriptors / Indicators  Aching    Pain  Type  Chronic pain    Pain Onset  More than a month ago    Pain Frequency  Intermittent    Aggravating Factors   standing, walking         [facility] PT Assessment - 06/16/18 0001      Assessment   Medical Diagnosis  Left Hip Pain     Referring Provider (PT)  Dr Lajean Manes       AROM   Lumbar Flexion  75%    Lumbar Extension  50%    Lumbar - Right Side Bend  75%    Lumbar - Left Side Bend  75%   pull but no pain reported today   Lumbar - Right Rotation  75%    Lumbar - Left Rotation  75%      Strength   Overall Strength Comments  unable to test due to telehealth visit but at least 3+/5 MMT as she can move against gravity repeated reps                   [facility] Adult PT Treatment/Exercise - 06/16/18 0001      Knee/Hip Exercises: Stretches   ITB Stretch  Left;3 reps;30 seconds  ITB Stretch Limitations  standing    Piriformis Stretch  Left;4 reps;30 seconds    Piriformis Stretch Limitations  sitting with left leg crossed over Rt, 2 reps KTOS, 2 reps pushing knee down    Other Knee/Hip Stretches  SKTC stretch on left 2 X 30 sec      Knee/Hip Exercises: Standing   Hip Flexion  Both;20 reps;Knee bent    Hip Flexion Limitations  standing with one UE support on table    Hip Abduction  Both;15 reps;Knee straight    Abduction Limitations  standing with one UE support on table    Hip Extension  Both;15 reps;Knee straight    Extension Limitations  standing with one UE support on chair    SLS  10 sec on Rt only able to get 3-4 sec on Lt for 5 reps    Other Standing Knee Exercises  sidestepping at couch up/down X 3 reps      Knee/Hip Exercises: Supine   Bridges Limitations  3X5, performed on her couch with small gentle ROM      Knee/Hip Exercises: Sidelying   Hip ABduction  3 sets;5 reps    Clams  20 reps on Lt      Knee/Hip Exercises: Prone   Other Prone Exercises  IR/ER ROM with daugther providing light manual resistance             PT Education -  06/16/18 1201    Education Details  how to manage symptoms first thing in the morning with heat, stretching, gentle ROM, resent her HEP    Person(s) Educated  Patient    Methods  Explanation;Demonstration;Verbal cues;Handout    Comprehension  Verbalized understanding;Returned demonstration       PT Short Term Goals - 04/05/18 0745      PT SHORT TERM GOAL #1   Title  Patient will demonstrate 5/5 gross bilteral strength     Time  3    Period  Weeks    Status  On-going      PT SHORT TERM GOAL #2   Title  Patient will report 2/10 pain at woirst with walking     Time  3    Period  Weeks    Status  On-going      PT SHORT TERM GOAL #3   Title  Patient will be indepednet with modified exercise program for her     Time  3    Period  Weeks    Status  On-going        PT Long Term Goals - 06/16/18 1207      PT LONG TERM GOAL #1   Title  Patient will return to her exercise program without pain     Time  6    Period  Weeks    Status  On-going      PT LONG TERM GOAL #2   Title  Patient will go up/down steps without pain     Time  6    Period  Weeks    Status  On-going      PT LONG TERM GOAL #3   Title  Patient will ambualte 3000' without AD or pain in order to perfrom ADL's     Time  6    Period  Weeks    Status  On-going            Plan - 06/16/18 1202    Clinical Impression Statement  Telehealth performed with her permission and  her daugher was available to assist with technology, holding the camera, and to provide manual resistance for one exercise. Recert porformed today as she was out of POC. She was only able to come in for 2 visits before COVID 19 pandemic so she has not progressed as she has wanted and therefore opted for teleheath this week and next. She then wants to come back in clinic after that when clinic reopens and PT agrees that she would benefit from manual therapy and hip mobilizations in clinic. She had good overall tolerance to exercises today but does  lack hip ROM, hip SL stability, and overall activity tolerance. She will continue to benefit from skilled PT to address these deficits.     Personal Factors and Comorbidities  Age;Comorbidity 1    Comorbidities  OA in the hip     Examination-Activity Limitations  Stand;Stairs;Squat    Examination-Participation Restrictions  Shop;Community Activity    Stability/Clinical Decision Making  Stable/Uncomplicated    Rehab Potential  Good    PT Frequency  2x / week    PT Duration  6 weeks    PT Treatment/Interventions  ADLs/Self Care Home Management;Cryotherapy;Electrical Stimulation;Iontophoresis 4mg /ml Dexamethasone;Moist Heat;Ultrasound;DME Instruction;Gait training;Stair training;Functional mobility training;Therapeutic activities;Therapeutic exercise;Neuromuscular re-education;Patient/family education;Manual techniques;Passive range of motion;Dry needling;Taping    PT Next Visit Plan  elavated left hip. Begin LAD ; and mobilzations for anterior hip rotation; begin pelvic stabilization exercises; consider supine march and clamshell; reviewe pelvic tilt; porgress to bridge when able     PT Home Exercise Plan  piriformis;IT band stretch,bridge lower trunkrotation; SLS, sidestepping, standing march, flexion, hip abd    Consulted and Agree with Plan of Care  Patient       Patient will benefit from skilled therapeutic intervention in order to improve the following deficits and impairments:  Abnormal gait, Pain, Decreased activity tolerance, Decreased endurance, Decreased range of motion, Decreased strength, Postural dysfunction, Decreased mobility  Visit Diagnosis: Pain in left hip  Other abnormalities of gait and mobility     Problem List Patient Active Problem List   Diagnosis Date Noted  . HYPERLIPIDEMIA 04/18/2007  . ALLERGIC RHINITIS 04/18/2007  . PULMONARY NODULE 04/18/2007  . CARCINOMA, RECTUM 04/17/2007  . ANXIETY 04/17/2007  . HYPERTENSION 04/17/2007  . COLITIS, ULCERATIVE  04/17/2007    [name] 06/16/2018, 12:14 PM  [facility] [address] [place], [place], [address] Phone: [phone]   Fax:  [phone]  Name: Sandy Trujillo MRN: [id number] Date of Birth: [birth year]

## 2018-06-21 ENCOUNTER — Ambulatory Visit: Payer: Medicare Other | Admitting: Physical Therapy

## 2018-06-26 ENCOUNTER — Telehealth: Payer: Self-pay | Admitting: Physical Therapy

## 2018-06-26 DIAGNOSIS — M5432 Sciatica, left side: Secondary | ICD-10-CM | POA: Diagnosis not present

## 2018-06-26 DIAGNOSIS — I10 Essential (primary) hypertension: Secondary | ICD-10-CM | POA: Diagnosis not present

## 2018-06-26 NOTE — Telephone Encounter (Signed)
Pt called to express increased pain over the last few days and she thinks she may have a pinched nerve. She does not relay any sensations of burning, numbness, tingling, or leg weakness so PT feels this may be more tendonitis or OA pain. She was encouraged to attend in person visit for further evaluation and also instructed to follow up with her MD about this pain. She requested HEP be emailed to her and this was and sent out with detailed instructions and pictures of what is recorded below.  Access Code: [email or web address]  URL: [email or web address]  Date: 06/26/2018  Prepared by: Elsie Ra   Exercises  Seated Piriformis Stretch - 2 reps - 1 sets - 30 hold - 2x daily - 6x weekly  ITB Stretch at Wall - 3 reps - 1 sets - 30 hold - 2x daily - 6x weekly  Single Leg Stance with Support - 10 reps - 1 sets - 5-10 sec hold - 2x daily - 6x weekly  Prone Hip External Rotation AROM - 10 reps - 3 sets - 2x daily - 6x weekly  Side Stepping with Counter Support - 3-5 reps - 1 sets - 2x daily - 6x weekly  Standing Hip Extension - 10 reps - 3 sets - 2x daily - 6x weekly  Supine Bridge - 10 reps - 2 sets - 5 hold - 2x daily - 6x weekly   Elsie Ra, PT, DPT 06/26/18 1:05 PM

## 2018-06-28 ENCOUNTER — Other Ambulatory Visit: Payer: Self-pay

## 2018-06-28 ENCOUNTER — Ambulatory Visit: Payer: Medicare Other | Attending: Geriatric Medicine | Admitting: Physical Therapy

## 2018-06-28 DIAGNOSIS — M25552 Pain in left hip: Secondary | ICD-10-CM | POA: Diagnosis not present

## 2018-06-28 DIAGNOSIS — R2689 Other abnormalities of gait and mobility: Secondary | ICD-10-CM | POA: Diagnosis not present

## 2018-06-28 NOTE — Therapy (Signed)
[place], [place], [address] Phone: [phone]   Fax:  [phone]  Physical Therapy Treatment  Patient Details  Name: Sandy Trujillo MRN: [id number] Date of Birth: 1932/12/19 Referring Provider (PT): Dr Lajean Manes    Encounter Date: 06/28/2018  PT End of Session - 06/28/18 1658    Visit Number  4    Number of Visits  12    Date for PT Re-Evaluation  07/28/18    Authorization Type  Medicare progress note on 10th visit.     PT Start Time  1500    PT Stop Time  1550    PT Time Calculation (min)  50 min    Activity Tolerance  Patient tolerated treatment well    Behavior During Therapy  WFL for tasks assessed/performed       Past Medical History:  Diagnosis Date  . Hyperparathyroidism ([place]) 2019  . Ileostomy in place [facility])   . Osteoporosis 2019  . rectal ca 2007   surg only    Past Surgical History:  Procedure Laterality Date  . ABDOMINAL HYSTERECTOMY    . BREAST EXCISIONAL BIOPSY Bilateral    benign  . COLON SURGERY  2007    There were no vitals filed for this visit.  Subjective Assessment - 06/28/18 1654    Subjective  Pt relays her hip feels better but this may be due to the prednizone she was just prescribed and started taking    Currently in Pain?  Yes    Pain Score  2     Pain Location  Hip    Pain Orientation  Right    Pain Descriptors / Indicators  Aching    Pain Type  Chronic pain    Pain Onset  More than a month ago    Pain Frequency  Intermittent                       [facility] Adult PT Treatment/Exercise - 06/28/18 0001      Knee/Hip Exercises: Stretches   Active Hamstring Stretch  Both;2 reps;30 seconds    Piriformis Stretch  Left;3 reps;30 seconds      Knee/Hip Exercises: Aerobic   Nustep  5 min L5 UE/LE      Knee/Hip Exercises: Standing   SLS  20 sec on Rt only able to get 3-4 sec on Lt for 5 reps      Knee/Hip Exercises: Seated   Ball Squeeze   10 sec X 15    Other Seated Knee/Hip Exercises  hip IR and hip ER with red band around ankle X 15 reps ea      Knee/Hip Exercises: Sidelying   Clams  15 reps on LT      Manual Therapy   Manual therapy comments  Hip PROM for flexion, IR/ER, hip mobs for distraction, latreal glide, and P-A  and A-P glides, STM and T.P. release to lateral hip, glutes, piriformis               PT Short Term Goals - 04/05/18 0745      PT SHORT TERM GOAL #1   Title  Patient will demonstrate 5/5 gross bilteral strength     Time  3    Period  Weeks    Status  On-going      PT SHORT TERM GOAL #2   Title  Patient will report 2/10 pain at woirst with walking  Time  3    Period  Weeks    Status  On-going      PT SHORT TERM GOAL #3   Title  Patient will be indepednet with modified exercise program for her     Time  3    Period  Weeks    Status  On-going        PT Long Term Goals - 06/16/18 1207      PT LONG TERM GOAL #1   Title  Patient will return to her exercise program without pain     Time  6    Period  Weeks    Status  On-going      PT LONG TERM GOAL #2   Title  Patient will go up/down steps without pain     Time  6    Period  Weeks    Status  On-going      PT LONG TERM GOAL #3   Title  Patient will ambualte 3000' without AD or pain in order to perfrom ADL's     Time  6    Period  Weeks    Status  On-going            Plan - 06/28/18 1659    Clinical Impression Statement  Session focused on manual therapy today to reduce hip pain and tightness and improve mobility. She had less pain today however this may be due to the prednizone. She was very sore after last visit so therex was backed down some. She was instructed to try PT a few more weeks and then if no significiant improvments will refer her back to MD but she has not been able to have consistent PT yet due to COVID 19.    Personal Factors and Comorbidities  Age;Comorbidity 1    Comorbidities  OA in the hip      Examination-Activity Limitations  Stand;Stairs;Squat    Examination-Participation Restrictions  Shop;Community Activity    Stability/Clinical Decision Making  Stable/Uncomplicated    Rehab Potential  Good    PT Frequency  2x / week    PT Duration  6 weeks    PT Treatment/Interventions  ADLs/Self Care Home Management;Cryotherapy;Electrical Stimulation;Iontophoresis 4mg /ml Dexamethasone;Moist Heat;Ultrasound;DME Instruction;Gait training;Stair training;Functional mobility training;Therapeutic activities;Therapeutic exercise;Neuromuscular re-education;Patient/family education;Manual techniques;Passive range of motion;Dry needling;Taping    PT Next Visit Plan  elavated left hip. Begin LAD ; and mobilzations for anterior hip rotation; begin pelvic stabilization exercises; consider supine march and clamshell; reviewe pelvic tilt; porgress to bridge when able     PT Home Exercise Plan  piriformis;IT band stretch,bridge lower trunkrotation; SLS, sidestepping, standing march, flexion, hip abd    Consulted and Agree with Plan of Care  Patient       Patient will benefit from skilled therapeutic intervention in order to improve the following deficits and impairments:  Abnormal gait, Pain, Decreased activity tolerance, Decreased endurance, Decreased range of motion, Decreased strength, Postural dysfunction, Decreased mobility  Visit Diagnosis: Pain in left hip  Other abnormalities of gait and mobility     Problem List Patient Active Problem List   Diagnosis Date Noted  . HYPERLIPIDEMIA 04/18/2007  . ALLERGIC RHINITIS 04/18/2007  . PULMONARY NODULE 04/18/2007  . CARCINOMA, RECTUM 04/17/2007  . ANXIETY 04/17/2007  . HYPERTENSION 04/17/2007  . COLITIS, ULCERATIVE 04/17/2007    Sandy Trujillo ,PT,DPT 06/28/2018, 5:02 PM  [facility] [address] [place], [place], [address] Phone: [phone]   Fax:  [phone]  Name: Sandy Trujillo MRN:  [id number] Date of Birth: 06/29/1932

## 2018-07-04 ENCOUNTER — Other Ambulatory Visit: Payer: Self-pay

## 2018-07-04 ENCOUNTER — Ambulatory Visit: Payer: Medicare Other | Admitting: Physical Therapy

## 2018-07-04 DIAGNOSIS — M25552 Pain in left hip: Secondary | ICD-10-CM

## 2018-07-04 DIAGNOSIS — R2689 Other abnormalities of gait and mobility: Secondary | ICD-10-CM | POA: Diagnosis not present

## 2018-07-04 NOTE — Therapy (Signed)
[place], [place], [address] Phone: [phone]   Fax:  [phone]  Physical Therapy Treatment  Patient Details  Name: Sandy Trujillo MRN: [id number] Date of Birth: 1932-10-23 Referring Provider (PT): Dr Lajean Manes    Encounter Date: 07/04/2018  PT End of Session - 07/04/18 1740    Visit Number  5    Number of Visits  12    Date for PT Re-Evaluation  07/28/18    Authorization Type  Medicare progress note on 10th visit.     PT Start Time  1402    PT Stop Time  1450    PT Time Calculation (min)  48 min    Activity Tolerance  Patient tolerated treatment well    Behavior During Therapy  WFL for tasks assessed/performed       Past Medical History:  Diagnosis Date  . Hyperparathyroidism ([place]) 2019  . Ileostomy in place [facility])   . Osteoporosis 2019  . rectal ca 2007   surg only    Past Surgical History:  Procedure Laterality Date  . ABDOMINAL HYSTERECTOMY    . BREAST EXCISIONAL BIOPSY Bilateral    benign  . COLON SURGERY  2007    There were no vitals filed for this visit.  Subjective Assessment - 07/04/18 1736    Subjective  Pt relays after prednizone was up the pain in her hip has come back some but if she walks it feels a little better. She describes the pain is more deep in the joint and PT explained that this sounds more like OA pain    Limitations  Standing    Diagnostic tests  X-ray    Patient Stated Goals  to have less pain in the hip    Currently in Pain?  Yes    Pain Score  3     Pain Location  Hip    Pain Orientation  Right    Pain Descriptors / Indicators  Aching    Pain Type  Chronic pain    Pain Radiating Towards  denies                       [facility] Adult PT Treatment/Exercise - 07/04/18 0001      Knee/Hip Exercises: Stretches   ITB Stretch  Left;20 seconds;3 reps    ITB Stretch Limitations  standing    Piriformis Stretch  Left;3 reps;30 seconds    Piriformis Stretch Limitations  manual      Knee/Hip Exercises: Aerobic   Tread Mill  6 min started at 1.0 then progressed to 1.4 after 2 min then 1.6 after another min      Knee/Hip Exercises: Standing   SLS  24 sec on Rt, improved on Lt 5-6 sec X 5 reps on Lt      Knee/Hip Exercises: Sidelying   Clams  15 reps on LT      Manual Therapy   Manual therapy comments  Hip PROM for flexion, IR/ER, hip mobs for distraction, inferior glides,latreal glide, and P-A  and A-P glides, STM and T.P. release to lateral hip, glutes, piriformis               PT Short Term Goals - 04/05/18 0745      PT SHORT TERM GOAL #1   Title  Patient will demonstrate 5/5 gross bilteral strength     Time  3    Period  Weeks    Status  On-going      PT SHORT TERM GOAL #2   Title  Patient will report 2/10 pain at woirst with walking     Time  3    Period  Weeks    Status  On-going      PT SHORT TERM GOAL #3   Title  Patient will be indepednet with modified exercise program for her     Time  3    Period  Weeks    Status  On-going        PT Long Term Goals - 06/16/18 1207      PT LONG TERM GOAL #1   Title  Patient will return to her exercise program without pain     Time  6    Period  Weeks    Status  On-going      PT LONG TERM GOAL #2   Title  Patient will go up/down steps without pain     Time  6    Period  Weeks    Status  On-going      PT LONG TERM GOAL #3   Title  Patient will ambualte 3000' without AD or pain in order to perfrom ADL's     Time  6    Period  Weeks    Status  On-going            Plan - 07/04/18 1741    Clinical Impression Statement  She reported MT was very beneficial so this was continued today. She also says walking helps so treadmill was added. Pain presented more as OA pain today. PT will continue to progress as able.    Personal Factors and Comorbidities  Age;Comorbidity 1    Comorbidities  OA in the hip     Examination-Activity Limitations   Stand;Stairs;Squat    Examination-Participation Restrictions  Shop;Community Activity    Stability/Clinical Decision Making  Stable/Uncomplicated    Rehab Potential  Good    PT Frequency  2x / week    PT Duration  6 weeks    PT Treatment/Interventions  ADLs/Self Care Home Management;Cryotherapy;Electrical Stimulation;Iontophoresis 4mg /ml Dexamethasone;Moist Heat;Ultrasound;DME Instruction;Gait training;Stair training;Functional mobility training;Therapeutic activities;Therapeutic exercise;Neuromuscular re-education;Patient/family education;Manual techniques;Passive range of motion;Dry needling;Taping    PT Next Visit Plan  elavated left hip. Begin LAD ; and mobilzations for anterior hip rotation; begin pelvic stabilization exercises; consider supine march and clamshell; reviewe pelvic tilt; porgress to bridge when able     PT Home Exercise Plan  piriformis;IT band stretch,bridge lower trunkrotation; SLS, sidestepping, standing march, flexion, hip abd    Consulted and Agree with Plan of Care  Patient       Patient will benefit from skilled therapeutic intervention in order to improve the following deficits and impairments:  Abnormal gait, Pain, Decreased activity tolerance, Decreased endurance, Decreased range of motion, Decreased strength, Postural dysfunction, Decreased mobility  Visit Diagnosis: Pain in left hip  Other abnormalities of gait and mobility     Problem List Patient Active Problem List   Diagnosis Date Noted  . HYPERLIPIDEMIA 04/18/2007  . ALLERGIC RHINITIS 04/18/2007  . PULMONARY NODULE 04/18/2007  . CARCINOMA, RECTUM 04/17/2007  . ANXIETY 04/17/2007  . HYPERTENSION 04/17/2007  . COLITIS, ULCERATIVE 04/17/2007    [name] 07/04/2018, 5:43 PM  [facility] [address] [place], [place], [address] Phone: [phone]   Fax:  [phone]  Name: Sandy Trujillo MRN: [id number] Date of Birth:  July 29, 1932

## 2018-07-06 ENCOUNTER — Ambulatory Visit: Payer: Medicare Other | Admitting: Physical Therapy

## 2018-07-07 DIAGNOSIS — I10 Essential (primary) hypertension: Secondary | ICD-10-CM | POA: Diagnosis not present

## 2018-07-10 ENCOUNTER — Telehealth: Payer: Self-pay | Admitting: Physical Therapy

## 2018-07-10 NOTE — Telephone Encounter (Signed)
Spoke with patient about her continued hip pain. She states that she got new heel lift in her shoe and she wore it 4 hours and she has been having a lot of hip pain since. PT advised her to ease into the heel lift and only wear it one hour first day and increase by one hour each other day. She will see PT tomorrow so she was advised to keep this appointment to see if PT can help relieve any of her pain. If PT is not helpful then will recommend she see ortho for some imaging of her hip.  Elsie Ra, PT, DPT 07/10/18 3:02 PM

## 2018-07-11 ENCOUNTER — Other Ambulatory Visit: Payer: Self-pay

## 2018-07-11 ENCOUNTER — Ambulatory Visit: Payer: Medicare Other | Admitting: Physical Therapy

## 2018-07-11 DIAGNOSIS — M25552 Pain in left hip: Secondary | ICD-10-CM

## 2018-07-11 DIAGNOSIS — R2689 Other abnormalities of gait and mobility: Secondary | ICD-10-CM

## 2018-07-11 NOTE — Therapy (Addendum)
[place], [place], [address] Phone: [phone]   Fax:  [phone]  Physical Therapy Treatment/Discharge addendum  PHYSICAL THERAPY DISCHARGE SUMMARY  Visits from Start of Care: 6  Current functional level related to goals / functional outcomes: Discharged to have back surgery which was found to be causing her hip pain   Remaining deficits: See below   Education / Equipment: HEP Plan: Patient agrees to discharge.  Patient goals were not met. Patient is being discharged due to                                                     ?????    Discharged to have back surgery which was found to be causing her hip pain      Patient Details  Name: Sandy Trujillo MRN: [id number] Date of Birth: 08/17/1932 Referring Provider (PT): Dr Lajean Manes    Encounter Date: 07/11/2018  PT End of Session - 07/11/18 1514    Visit Number  6    Number of Visits  12    Date for PT Re-Evaluation  07/28/18    Authorization Type  Medicare progress note on 10th visit.     PT Start Time  1400    PT Stop Time  1453    PT Time Calculation (min)  53 min    Activity Tolerance  Patient limited by pain    Behavior During Therapy  [facility] for tasks assessed/performed       Past Medical History:  Diagnosis Date  . Hyperparathyroidism ([place]) 2019  . Ileostomy in place [facility])   . Osteoporosis 2019  . rectal ca 2007   surg only    Past Surgical History:  Procedure Laterality Date  . ABDOMINAL HYSTERECTOMY    . BREAST EXCISIONAL BIOPSY Bilateral    benign  . COLON SURGERY  2007    There were no vitals filed for this visit.  Subjective Assessment - 07/11/18 1459    Subjective  Pt relays severe Lt hip pain over last 3 stays that started on Friday. When asked if she did anything new she reports she got new heel lift and wore it about 4 hours doing activity around the house. She is now is having increased pain with weight  bearing and is having to use a walking stick    Patient is accompained by:  Family member   daughter   Limitations  Standing    Diagnostic tests  X-ray    Patient Stated Goals  to have less pain in the hip    Currently in Pain?  Yes    Pain Score  9     Pain Location  Hip    Pain Orientation  Left    Pain Descriptors / Indicators  Aching;Constant;Sore;Tightness    Pain Type  Acute pain    Pain Radiating Towards  down Rt postero-lateral pain    Pain Onset  More than a month ago    Pain Frequency  Constant                       [facility] Adult PT Treatment/Exercise - 07/11/18 0001      Knee/Hip Exercises: Stretches   Passive Hamstring Stretch  Left;3 reps;30 seconds    ITB Stretch  Left;3 reps;30  seconds    ITB Stretch Limitations  supine passive stretching    Piriformis Stretch  Left;3 reps;30 seconds    Piriformis Stretch Limitations  manual    Other Knee/Hip Stretches  SKTC stretch on left 3 X 30 sec passive      Knee/Hip Exercises: Aerobic   Nustep  5 min L5 UE/LE      Modalities   Modalities  Iontophoresis      Iontophoresis   Type of Iontophoresis  Dexamethasone    Location  Lt greater trochanter    Dose  80 mAMP 1.0 CC    Time  6 hour wear home patch      Manual Therapy   Manual therapy comments  Hip PROM for flexion, IR/ER, hip mobs for distraction, inferior glides,latreal glide, STM and T.P. release to lateral hip, glutes, piriformis             PT Education - 07/11/18 1505    Education Details  Ionto edu and instructions, recommendation for ortho referral for diagnostic testing due to severe pain over past week    Person(s) Educated  Patient;Child(ren)    Methods  Explanation    Comprehension  Verbalized understanding       PT Short Term Goals - 04/05/18 0745      PT SHORT TERM GOAL #1   Title  Patient will demonstrate 5/5 gross bilteral strength     Time  3    Period  Weeks    Status  On-going      PT SHORT TERM GOAL #2   Title   Patient will report 2/10 pain at woirst with walking     Time  3    Period  Weeks    Status  On-going      PT SHORT TERM GOAL #3   Title  Patient will be indepednet with modified exercise program for her     Time  3    Period  Weeks    Status  On-going        PT Long Term Goals - 06/16/18 1207      PT LONG TERM GOAL #1   Title  Patient will return to her exercise program without pain     Time  6    Period  Weeks    Status  On-going      PT LONG TERM GOAL #2   Title  Patient will go up/down steps without pain     Time  6    Period  Weeks    Status  On-going      PT LONG TERM GOAL #3   Title  Patient will ambualte 3000' without AD or pain in order to perfrom ADL's     Time  6    Period  Weeks    Status  On-going            Plan - 07/11/18 1511    Clinical Impression Statement  Pt presented with possible hip bursitis from maybe using new heel lift. She discontinued it after pain became severe. She now is having increased dificulty with weight bearing and is having to use walking stick. Due to her severe pain levels over the last week PT is recommending she contact her PCP for an ortho referral for further diagnostic testing as she does have osteoporosis. Strengthening program was held today due to pain and session focused on stretching and manual therapy instead. Ionto was trialed in attempt to decrease pain and  inflammation    Personal Factors and Comorbidities  Age;Comorbidity 1    Comorbidities  OA in the hip     Examination-Activity Limitations  Stand;Stairs;Squat    Examination-Participation Restrictions  Shop;Community Activity    Stability/Clinical Decision Making  Stable/Uncomplicated    Rehab Potential  Good    PT Frequency  2x / week    PT Duration  6 weeks    PT Treatment/Interventions  ADLs/Self Care Home Management;Cryotherapy;Electrical Stimulation;Iontophoresis 38m/ml Dexamethasone;Moist Heat;Ultrasound;DME Instruction;Gait training;Stair  training;Functional mobility training;Therapeutic activities;Therapeutic exercise;Neuromuscular re-education;Patient/family education;Manual techniques;Passive range of motion;Dry needling;Taping    PT Next Visit Plan  elavated left hip. Begin LAD ; and mobilzations for anterior hip rotation; begin pelvic stabilization exercises; consider supine march and clamshell; reviewe pelvic tilt; porgress to bridge when able     PT Home Exercise Plan  piriformis;IT band stretch,bridge lower trunkrotation; SLS, sidestepping, standing march, flexion, hip abd    Consulted and Agree with Plan of Care  Patient       Patient will benefit from skilled therapeutic intervention in order to improve the following deficits and impairments:  Abnormal gait, Pain, Decreased activity tolerance, Decreased endurance, Decreased range of motion, Decreased strength, Postural dysfunction, Decreased mobility  Visit Diagnosis: 1. Pain in left hip   2. Other abnormalities of gait and mobility        Problem List Patient Active Problem List   Diagnosis Date Noted  . HYPERLIPIDEMIA 04/18/2007  . ALLERGIC RHINITIS 04/18/2007  . PULMONARY NODULE 04/18/2007  . CARCINOMA, RECTUM 04/17/2007  . ANXIETY 04/17/2007  . HYPERTENSION 04/17/2007  . COLITIS, ULCERATIVE 04/17/2007    BSilvestre Mesi6/16/2020, 3:16 PM  [facility] [address]Phone: [phone]  Fax:  [phone] Name: Sandy STOUFFERMRN: [id number] of Birth: [birth year]

## 2018-07-13 ENCOUNTER — Ambulatory Visit: Payer: Medicare Other | Admitting: Physical Therapy

## 2018-07-17 ENCOUNTER — Encounter: Payer: Self-pay | Admitting: Physician Assistant

## 2018-07-17 ENCOUNTER — Ambulatory Visit (INDEPENDENT_AMBULATORY_CARE_PROVIDER_SITE_OTHER): Payer: Medicare Other | Admitting: Physician Assistant

## 2018-07-17 ENCOUNTER — Other Ambulatory Visit: Payer: Self-pay

## 2018-07-17 ENCOUNTER — Telehealth: Payer: Self-pay | Admitting: Orthopaedic Surgery

## 2018-07-17 VITALS — Ht 60.0 in | Wt 145.0 lb

## 2018-07-17 DIAGNOSIS — M7062 Trochanteric bursitis, left hip: Secondary | ICD-10-CM | POA: Diagnosis not present

## 2018-07-17 MED ORDER — LIDOCAINE HCL 1 % IJ SOLN
3.0000 mL | INTRAMUSCULAR | Status: AC | PRN
Start: 2018-07-17 — End: 2018-07-17
  Administered 2018-07-17: 3 mL

## 2018-07-17 MED ORDER — METHYLPREDNISOLONE ACETATE 40 MG/ML IJ SUSP
40.0000 mg | INTRAMUSCULAR | Status: AC | PRN
  Administered 2018-07-17: 40 mg via INTRA_ARTICULAR

## 2018-07-17 NOTE — Progress Notes (Signed)
Office Visit Note   Patient: Sandy Trujillo           Date of Birth: March 30, 1932           MRN: [id number] Visit Date: 07/17/2018              Requested by: Lajean Manes, MD [address] [place],  [place] [address] PCP: Lajean Manes, MD   Assessment & Plan: Visit Diagnoses:  1. Trochanteric bursitis, left hip     Plan: She will continue IT band stretching exercises shown today.  Follow-up in 2 weeks to check her response to the injection.  If she is doing well and has no pain then she can call and cancel the appointment.  Questions were encouraged and answered both the patient and her daughter who is present throughout examination today.  Follow-Up Instructions: Return in about 2 weeks (around 07/31/2018).   Orders:  Orders Placed This Encounter  Procedures  . Large Joint Inj: L greater trochanter   No orders of the defined types were placed in this encounter.     Procedures: Large Joint Inj: L greater trochanter on 07/17/2018 10:14 AM Indications: pain Details: 22 G 1.5 in needle, lateral approach  Arthrogram: No  Medications: 3 mL lidocaine 1 %; 40 mg methylPREDNISolone acetate 40 MG/ML Outcome: tolerated well, no immediate complications Procedure, treatment alternatives, risks and benefits explained, specific risks discussed. Consent was given by the patient. Immediately prior to procedure a time out was called to verify the correct patient, procedure, equipment, support staff and site/side marked as required. Patient was prepped and draped in the usual sterile fashion.       Clinical Data: No additional findings.   Subjective: Chief Complaint  Patient presents with  . Left Hip - Pain    HPI Sandy Trujillo is 85 year old female comes in today with left hip pain since November 2019.  She seen by primary care physician and given total of 2 Medrol Dosepaks the first 1 gave her no relief the second 1 did give her some reliefmonth or so ago  that she had the last dose pack.  She is also tried Mobic which caused swelling of her ankle and she stopped taking it.  Has taken Tylenol that is not really helping.  She is also been told that she has a leg length discrepancy was given a heel lift for her left shoe.  She is going to physical therapy and they are working with her on range of motion stretching.  Patient denies any injury to the hip.  Denies any numbness tingling down the left leg. Radiographs are consistent dated 01/23/2018 AP pelvis lateral views of the left hip are reviewed.  These showed no acute fractures.  Some sclerotic changes off the lateral aspect of the greater trochanter.  But overall the hip is well-maintained.  Review of Systems Please see HPI otherwise negative  Objective: Vital Signs: Ht 5' (1.524 m)   Wt 145 lb (65.8 kg)   BMI 28.32 kg/m   Physical Exam Constitutional:      Appearance: She is not ill-appearing or diaphoretic.  Pulmonary:     Effort: Pulmonary effort is normal.  Neurological:     Mental Status: She is alert and oriented to person, place, and time.     Ortho Exam Bilateral hips good range of motion.  Left hip she is able to cross over the right.  She has good flexion of the hip without significant pain.  Extreme external rotation causes some discomfort.  She has tenderness over the left trochanteric region and down the IT band.  Good range of motion left knee without pain or crepitance.  Left knee nontender throughout.  Specialty Comments:  No specialty comments available.  Imaging: No results found.   PMFS History: Patient Active Problem List   Diagnosis Date Noted  . HYPERLIPIDEMIA 04/18/2007  . ALLERGIC RHINITIS 04/18/2007  . PULMONARY NODULE 04/18/2007  . CARCINOMA, RECTUM 04/17/2007  . ANXIETY 04/17/2007  . HYPERTENSION 04/17/2007  . COLITIS, ULCERATIVE 04/17/2007   Past Medical History:  Diagnosis Date  . Hyperparathyroidism ([place]) 2019  . Ileostomy in place [facility])   .  Osteoporosis 2019  . rectal ca 2007   surg only    History reviewed. No pertinent family history.  Past Surgical History:  Procedure Laterality Date  . ABDOMINAL HYSTERECTOMY    . BREAST EXCISIONAL BIOPSY Bilateral    benign  . COLON SURGERY  2007   Social History   Occupational History  . Not on file  Tobacco Use  . Smoking status: Never Smoker  . Smokeless tobacco: Never Used  Substance and Sexual Activity  . Alcohol use: Yes  . Drug use: Never  . Sexual activity: Not on file

## 2018-07-17 NOTE — Telephone Encounter (Signed)
Please advise 

## 2018-07-17 NOTE — Telephone Encounter (Signed)
Patient called asked if she can get something for pain. Patient said she is really hurting pretty bad. The number to contact patient is [phone]

## 2018-07-17 NOTE — Telephone Encounter (Signed)
Sandy Trujillo just gave her an injection in her hip today and so hopefully that steroid will kick in and will help her quite a bit.  She can also go to [facility] or any other pharmacy that has over-the-counter Voltaren gel and apply this to her hip area 2-4 times daily.

## 2018-07-19 DIAGNOSIS — M79605 Pain in left leg: Secondary | ICD-10-CM | POA: Diagnosis not present

## 2018-07-24 ENCOUNTER — Other Ambulatory Visit: Payer: Self-pay | Admitting: Geriatric Medicine

## 2018-07-24 ENCOUNTER — Other Ambulatory Visit (HOSPITAL_COMMUNITY): Payer: Self-pay | Admitting: Geriatric Medicine

## 2018-07-24 DIAGNOSIS — M25552 Pain in left hip: Secondary | ICD-10-CM

## 2018-07-24 DIAGNOSIS — I10 Essential (primary) hypertension: Secondary | ICD-10-CM | POA: Diagnosis not present

## 2018-07-31 ENCOUNTER — Ambulatory Visit: Payer: Medicare Other | Admitting: Orthopaedic Surgery

## 2018-08-05 ENCOUNTER — Ambulatory Visit (HOSPITAL_COMMUNITY)
Admission: RE | Admit: 2018-08-05 | Discharge: 2018-08-05 | Disposition: A | Discharge status: Home / Self Care | Payer: Medicare Other | Source: Ambulatory Visit | Attending: Geriatric Medicine | Admitting: Geriatric Medicine

## 2018-08-05 DIAGNOSIS — M25552 Pain in left hip: Secondary | ICD-10-CM

## 2018-08-07 ENCOUNTER — Encounter: Payer: Self-pay | Admitting: Orthopaedic Surgery

## 2018-08-07 ENCOUNTER — Ambulatory Visit (INDEPENDENT_AMBULATORY_CARE_PROVIDER_SITE_OTHER): Payer: Medicare Other | Admitting: Orthopaedic Surgery

## 2018-08-07 ENCOUNTER — Other Ambulatory Visit: Payer: Self-pay

## 2018-08-07 DIAGNOSIS — M7062 Trochanteric bursitis, left hip: Secondary | ICD-10-CM | POA: Diagnosis not present

## 2018-08-07 NOTE — Progress Notes (Signed)
The patient comes in for continued evaluation treatment of left hip pain.  She is 85 years old and 2 weeks ago had a steroid injection around her left trochanteric area.  She says that did not really help much unfortunately but meloxicam has helped.  She ambulates using a walking stick.  She denies any groin pain.  She only points the trochanteric area as a source of her pain.  She recently had an MRI of this left hip.  I did go over this with her and her daughter.  It did not show any problems in the hip joint itself.  Her cartilage looks wonderful on the left hip.  There is only a mild tendinosis or tendinitis around the trochanteric area.  There is no evidence of fracture either.  On exam she has fluid range of motion of her left hip with no pain in the groin at all.  There is no blocks rotation.  Her pain is only to palpation over the trochanteric area and the IT band.  There is no sciatic pain.  I do feel that she does have trochanteric bursitis based on her clinical exam and MRI findings.  It is unfortunate that the injection did not help and I do feel it is worth trying another injection in about 5 weeks from now.  Also recommended Voltaren gel and continue therapy.  All question concerns were answered and addressed.  We will see what she looks like in 5 weeks to consider a trochanteric injection or even 1 in the IT band as well.

## 2018-08-09 ENCOUNTER — Ambulatory Visit: Payer: Medicare Other | Admitting: Orthopaedic Surgery

## 2018-08-10 ENCOUNTER — Telehealth: Payer: Self-pay | Admitting: Physical Therapy

## 2018-08-10 NOTE — Telephone Encounter (Signed)
She called PT to discuss MD findings and MRI results that she does not have tear but likely hip bursitis and Lt glute medius tendinosis. She also will have lumbar MRI. PT advised to hold off on coming back to PT until MRI results and in the meantime adjusted her HEP to be more gentle. HEP listed below and she will F/U with PT after MRI results on her back.   Access Code: [email or web address]  URL: [email or web address]  Date: 08/10/2018  Prepared by: Elsie Ra   Exercises  Seated Piriformis Stretch - 3 reps - 1 sets - 30 hold - 2x daily - 6x weekly  Hip Flexion Stretch - 10 reps - 3 sets - 2x daily - 6x weekly  Supine ITB Stretch with Strap - 3 reps - 1 sets - 30 hold - 2x daily - 6x weekly  Supine Bridge - 10 reps - 1-2 sets - 5 hold - 2x daily - 6x weekly  Clamshell - 10 reps - 3 sets - 2x daily - 6x weekly  Standing Marching - 10 reps - 1-3 sets - 2x daily - 6x weekly   Elsie Ra, PT, DPT 08/10/18 10:03 PM

## 2018-08-12 ENCOUNTER — Other Ambulatory Visit: Payer: Self-pay | Admitting: Geriatric Medicine

## 2018-08-12 DIAGNOSIS — M5432 Sciatica, left side: Secondary | ICD-10-CM

## 2018-08-14 ENCOUNTER — Other Ambulatory Visit: Payer: Medicare Other

## 2018-08-18 ENCOUNTER — Other Ambulatory Visit: Payer: Self-pay

## 2018-08-18 ENCOUNTER — Ambulatory Visit
Admission: RE | Admit: 2018-08-18 | Discharge: 2018-08-18 | Disposition: A | Discharge status: Home / Self Care | Payer: Medicare Other | Source: Ambulatory Visit | Attending: Geriatric Medicine | Admitting: Geriatric Medicine

## 2018-08-18 DIAGNOSIS — M5432 Sciatica, left side: Secondary | ICD-10-CM

## 2018-08-18 DIAGNOSIS — M48061 Spinal stenosis, lumbar region without neurogenic claudication: Secondary | ICD-10-CM | POA: Diagnosis not present

## 2018-08-24 DIAGNOSIS — Z6827 Body mass index (BMI) 27.0-27.9, adult: Secondary | ICD-10-CM | POA: Diagnosis not present

## 2018-08-24 DIAGNOSIS — I10 Essential (primary) hypertension: Secondary | ICD-10-CM | POA: Diagnosis not present

## 2018-08-24 DIAGNOSIS — M7138 Other bursal cyst, other site: Secondary | ICD-10-CM | POA: Diagnosis not present

## 2018-08-24 DIAGNOSIS — M5416 Radiculopathy, lumbar region: Secondary | ICD-10-CM | POA: Diagnosis not present

## 2018-08-27 DIAGNOSIS — R0982 Postnasal drip: Secondary | ICD-10-CM | POA: Diagnosis not present

## 2018-08-30 DIAGNOSIS — M48061 Spinal stenosis, lumbar region without neurogenic claudication: Secondary | ICD-10-CM | POA: Diagnosis not present

## 2018-08-30 DIAGNOSIS — M5416 Radiculopathy, lumbar region: Secondary | ICD-10-CM | POA: Diagnosis not present

## 2018-08-30 DIAGNOSIS — M4726 Other spondylosis with radiculopathy, lumbar region: Secondary | ICD-10-CM | POA: Diagnosis not present

## 2018-09-11 ENCOUNTER — Ambulatory Visit: Payer: Medicare Other | Admitting: Orthopaedic Surgery

## 2018-09-12 ENCOUNTER — Telehealth: Payer: Self-pay | Admitting: Physical Therapy

## 2018-09-12 ENCOUNTER — Other Ambulatory Visit: Payer: Self-pay | Admitting: Geriatric Medicine

## 2018-09-12 DIAGNOSIS — Z1231 Encounter for screening mammogram for malignant neoplasm of breast: Secondary | ICD-10-CM

## 2018-09-12 NOTE — Telephone Encounter (Signed)
Pt called and requested PT to call her back. She ended up having MRI after her hip pain continued to worsen, she ended up needing back surgery. She will follow up with MD in 2-3 weeks and then was informed to call PT office back if MD wants her to have more PT.   Elsie Ra, PT, DPT 09/12/18 12:08 PM

## 2018-10-12 ENCOUNTER — Other Ambulatory Visit: Payer: Self-pay

## 2018-10-12 ENCOUNTER — Ambulatory Visit: Payer: Medicare Other | Attending: Geriatric Medicine | Admitting: Physical Therapy

## 2018-10-12 DIAGNOSIS — R2689 Other abnormalities of gait and mobility: Secondary | ICD-10-CM

## 2018-10-12 DIAGNOSIS — G8929 Other chronic pain: Secondary | ICD-10-CM | POA: Diagnosis not present

## 2018-10-12 DIAGNOSIS — M6281 Muscle weakness (generalized): Secondary | ICD-10-CM

## 2018-10-12 DIAGNOSIS — M545 Low back pain, unspecified: Secondary | ICD-10-CM

## 2018-10-12 NOTE — Patient Instructions (Signed)
Access Code: [email or web address]  URL: [email or web address]  Date: 10/12/2018  Prepared by: Elsie Ra   Exercises  Seated Hamstring Stretch - 3 sets - 30 hold - 2x daily - 6x weekly  Supine Piriformis Stretch with Foot on Ground - 3 sets - 30 hold - 2x daily - 6x weekly  Supine Lower Trunk Rotation - 10 reps - 1-2 sets - 5 hold - 2x daily - 6x weekly  Hooklying Single Knee to Chest - 3 sets - 30 hold - 2x daily - 6x weekly  Supine Active Straight Leg Raise - 10 reps - 1-3 sets - 2x daily - 6x weekly  Clamshell - 10 reps - 2 sets - 2x daily - 6x weekly  Standing Hip Extension - 10 reps - 1-2 sets - 2x daily - 6x weekly  Tandem Walking with Counter Support - 3 sets - 2x daily - 6x weekly  Backward Walking with Counter Support - 3 reps - 1 sets - 2x daily - 6x weekly

## 2018-10-13 ENCOUNTER — Encounter: Payer: Self-pay | Admitting: Physical Therapy

## 2018-10-13 NOTE — Therapy (Signed)
[place], [place], [address] Phone: [phone]   Fax:  [phone]  Physical Therapy Evaluation  Patient Details  Name: Sandy Trujillo MRN: [id number] Date of Birth: [birth year] Referring Provider (PT): Earnie Larsson, MD   Encounter Date: 10/12/2018  PT End of Session - 10/13/18 0826    Visit Number  1    Number of Visits  12    Date for PT Re-Evaluation  11/24/18    Authorization Type  MCR/BCBS    PT Start Time  1400    PT Stop Time  1445    PT Time Calculation (min)  45 min    Activity Tolerance  Patient tolerated treatment well    Behavior During Therapy  [facility] for tasks assessed/performed       Past Medical History:  Diagnosis Date  . Hyperparathyroidism ([place]) 2019  . Ileostomy in place [facility])   . Osteoporosis 2019  . rectal ca 2007   surg only    Past Surgical History:  Procedure Laterality Date  . ABDOMINAL HYSTERECTOMY    . BREAST EXCISIONAL BIOPSY Bilateral    benign  . COLON SURGERY  2007    There were no vitals filed for this visit.   Subjective Assessment - 10/13/18 0803    Subjective  She was being treated for PT for hip pain but unalbe to progress with PT, she ended up having MRI showing severe lumbar stensosis and after failing conservative treatment had lumbar decompression on 08/30/18. She now is not having much pain but is having some weakness, needs to build up her standing tolerance, and feels she is unsteady at times.    Pertinent History  lumbar decomplression L4-5 laminectomy, facectomy, and foraminotomy on 08/30/18    Limitations  Lifting;Standing;Walking;House hold activities    How long can you sit comfortably?  not limited    How long can you stand comfortably?  20 min    How long can you walk comfortably?  can get through the grocery store but legs and back get real tired after this    Diagnostic tests  MRI    Patient Stated Goals  improve balance, strength, and  tightness in her back    Currently in Pain?  Yes    Pain Score  2     Pain Location  Back    Pain Orientation  Lower    Pain Descriptors / Indicators  Tightness;Tiring    Pain Type  Surgical pain    Pain Radiating Towards  denies    Pain Onset  More than a month ago    Pain Frequency  Intermittent    Aggravating Factors   walking too far    Pain Relieving Factors  rest, stretchin    Multiple Pain Sites  No         [facility] PT Assessment - 10/13/18 0001      Assessment   Medical Diagnosis  lumbar decompllumbar decomplression L4-5 laminectomy, facectomy, and foraminotomy on 08/30/18    Referring Provider (PT)  Earnie Larsson, MD    Onset Date/Surgical Date  08/30/18    Next MD Visit  not scheduled    Prior Therapy  PT for hip      Precautions   Precautions  None      Restrictions   Weight Bearing Restrictions  No      Balance Screen   Has the patient fallen in the past 6 months  No  Home Environment   Living Environment  Private residence      Prior Function   Level of Independence  Independent      Cognition   Overall Cognitive Status  Within Functional Limits for tasks assessed      Observation/Other Assessments   Observations  incision site looks well healed      Sensation   Light Touch  Appears Intact      Coordination   Gross Motor Movements are Fluid and Coordinated  Yes      ROM / Strength   AROM / PROM / Strength  AROM;Strength      AROM   AROM Assessment Site  Lumbar    Lumbar Flexion  50%    Lumbar Extension  75%    Lumbar - Right Side Bend  75%    Lumbar - Left Side Bend  75%    Lumbar - Right Rotation  75%    Lumbar - Left Rotation  75%      Strength   Overall Strength Comments  Hip bilat overall 4/5 MMT tested laying down, (Rt ankle 5/5, Lt ankle 4+/5, Knee strength bilat 5/5 tested in sitting)      Flexibility   Soft Tissue Assessment /Muscle Length  --   tight hips, tight lumbar P.S, tight H.S     Palpation   Palpation comment  no TTP       Transfers   Transfers  Independent with all Transfers      Ambulation/Gait   Ambulation/Gait  Yes    Ambulation/Gait Assistance  5: Supervision    Ambulation/Gait Assistance Details  100    Gait Comments  steady with straight walking, but mild decreased velocity and decreased dynamic balance with head turns, turning, stepping over objects, retro walking, narrow base of support      Functional Gait  Assessment   Gait assessed   Yes    Gait Level Surface  Walks 20 ft in less than 5.5 sec, no assistive devices, good speed, no evidence for imbalance, normal gait pattern, deviates no more than 6 in outside of the 12 in walkway width.    Change in Gait Speed  Able to change speed, demonstrates mild gait deviations, deviates 6-10 in outside of the 12 in walkway width, or no gait deviations, unable to achieve a major change in velocity, or uses a change in velocity, or uses an assistive device.    Gait with Horizontal Head Turns  Performs head turns smoothly with slight change in gait velocity (eg, minor disruption to smooth gait path), deviates 6-10 in outside 12 in walkway width, or uses an assistive device.    Gait with Vertical Head Turns  Performs head turns with no change in gait. Deviates no more than 6 in outside 12 in walkway width.    Gait and Pivot Turn  Pivot turns safely in greater than 3 sec and stops with no loss of balance, or pivot turns safely within 3 sec and stops with mild imbalance, requires small steps to catch balance.    Step Over Obstacle  Is able to step over one shoe box (4.5 in total height) but must slow down and adjust steps to clear box safely. May require verbal cueing.    Gait with Narrow Base of Support  Ambulates less than 4 steps heel to toe or cannot perform without assistance.    Gait with Eyes Closed  Walks 20 ft, slow speed, abnormal gait pattern, evidence for imbalance,  deviates 10-15 in outside 12 in walkway width. Requires more than 9 sec to ambulate 20 ft.     Ambulating Backwards  Walks 20 ft, uses assistive device, slower speed, mild gait deviations, deviates 6-10 in outside 12 in walkway width.    Steps  Alternating feet, must use rail.    Total Score  18                Objective measurements completed on examination: See above findings.      [place] Adult PT Treatment/Exercise - 10/13/18 0001      Modalities   Modalities  Moist Heat      Moist Heat Therapy   Number Minutes Moist Heat  10 Minutes   with education provided   Moist Heat Location  Lumbar Spine             PT Education - 10/13/18 0825    Education Details  HEP, POC, stretching, progressing back lumbar ROM without restrictions    Person(s) Educated  Patient    Methods  Explanation;Demonstration;Verbal cues;Handout    Comprehension  Verbalized understanding;Need further instruction          PT Long Term Goals - 10/13/18 0834      PT LONG TERM GOAL #1   Title  Patient will return to her exercise program without pain     Time  6    Period  Weeks    Status  New      PT LONG TERM GOAL #2   Title  Patient will go up/down steps without pain     Time  6    Period  Weeks    Status  New      PT LONG TERM GOAL #3   Title  Patient will ambualte 3000' without AD or pain in order to perfrom ADL's     Time  6    Period  Weeks    Status  New      PT LONG TERM GOAL #4   Title  She will improve lumbar ROM to [facility] (at least 75% all planes)    Status  New      PT LONG TERM GOAL #5   Title  She will improve FGA to at least 22/30 to show improved balance    Baseline  18    Status  New             Plan - 10/13/18 0827    Clinical Impression Statement  Pt had lumbar decompllumbar decomplression L4-5 laminectomy, facectomy, and foraminotomy on 08/30/18. She is overall recovering quite well. She now has mild back pain but more tiredness and tightness and weakness in her back and legs along with some gait instability. She will benefit from skilled PT  to address her deficits and progosis is great at this time.    Examination-Activity Limitations  Locomotion Level;Carry;Squat;Stairs;Stand;Lift    Examination-Participation Restrictions  Meal Prep;Cleaning;Community Activity;Laundry;Yard Work;Shop    Stability/Clinical Decision Making  Evolving/Moderate complexity    Clinical Decision Making  Moderate    Rehab Potential  Excellent    PT Frequency  2x / week    PT Duration  6 weeks    PT Treatment/Interventions  ADLs/Self Care Home Management;Aquatic Therapy;Cryotherapy;Electrical Stimulation;Iontophoresis 4mg /ml Dexamethasone;Moist Heat;Traction;Ultrasound;Gait training;Stair training;Therapeutic activities;Therapeutic exercise;Balance training;Neuromuscular re-education;Manual techniques;Passive range of motion;Joint Manipulations;Spinal Manipulations    PT Next Visit Plan  review HEP, needs lumbar and Hip stretching and strengthening, dynamic balance and gait.    PT Home  Exercise Plan  Access Code: [email or web address]       Patient will benefit from skilled therapeutic intervention in order to improve the following deficits and impairments:  Abnormal gait, Decreased activity tolerance, Decreased balance, Decreased endurance, Decreased range of motion, Decreased strength, Difficulty walking, Pain  Visit Diagnosis: Chronic bilateral low back pain without sciatica  Other abnormalities of gait and mobility  Muscle weakness (generalized)     Problem List Patient Active Problem List   Diagnosis Date Noted  . HYPERLIPIDEMIA 04/18/2007  . ALLERGIC RHINITIS 04/18/2007  . PULMONARY NODULE 04/18/2007  . CARCINOMA, RECTUM 04/17/2007  . ANXIETY 04/17/2007  . HYPERTENSION 04/17/2007  . COLITIS, ULCERATIVE 04/17/2007    [name] 10/13/2018, 8:37 AM  [facility] [address] [place], [place], [address] Phone: [phone]   Fax:  [phone]  Name: Sandy Trujillo MRN:  [id number] Date of Birth: 1932/02/06

## 2018-10-16 DIAGNOSIS — Z23 Encounter for immunization: Secondary | ICD-10-CM | POA: Diagnosis not present

## 2018-10-16 DIAGNOSIS — E222 Syndrome of inappropriate secretion of antidiuretic hormone: Secondary | ICD-10-CM | POA: Diagnosis not present

## 2018-10-16 DIAGNOSIS — I10 Essential (primary) hypertension: Secondary | ICD-10-CM | POA: Diagnosis not present

## 2018-10-26 ENCOUNTER — Ambulatory Visit: Payer: Medicare Other | Attending: Geriatric Medicine | Admitting: Physical Therapy

## 2018-10-26 ENCOUNTER — Other Ambulatory Visit: Payer: Self-pay

## 2018-10-26 DIAGNOSIS — G8929 Other chronic pain: Secondary | ICD-10-CM | POA: Diagnosis not present

## 2018-10-26 DIAGNOSIS — R2689 Other abnormalities of gait and mobility: Secondary | ICD-10-CM | POA: Insufficient documentation

## 2018-10-26 DIAGNOSIS — M6281 Muscle weakness (generalized): Secondary | ICD-10-CM | POA: Diagnosis not present

## 2018-10-26 DIAGNOSIS — M545 Low back pain, unspecified: Secondary | ICD-10-CM

## 2018-10-26 DIAGNOSIS — M25552 Pain in left hip: Secondary | ICD-10-CM

## 2018-10-26 NOTE — Therapy (Signed)
[place], [place], [address] Phone: [phone]   Fax:  [phone]  Physical Therapy Treatment  Patient Details  Name: Sandy Trujillo MRN: [id number] Date of Birth: [birth year] Referring Provider (PT): Earnie Larsson, MD   Encounter Date: 10/26/2018  PT End of Session - 10/26/18 1640    Visit Number  2    Number of Visits  12    Date for PT Re-Evaluation  11/24/18    Authorization Type  MCR/BCBS    PT Start Time  1530    PT Stop Time  1615    PT Time Calculation (min)  45 min    Activity Tolerance  Patient tolerated treatment well    Behavior During Therapy  [facility] for tasks assessed/performed       Past Medical History:  Diagnosis Date  . Hyperparathyroidism ([place]) 2019  . Ileostomy in place [facility])   . Osteoporosis 2019  . rectal ca 2007   surg only    Past Surgical History:  Procedure Laterality Date  . ABDOMINAL HYSTERECTOMY    . BREAST EXCISIONAL BIOPSY Bilateral    benign  . COLON SURGERY  2007    There were no vitals filed for this visit.  Subjective Assessment - 10/26/18 1601    Subjective  She relays the back is overall doing good but has some pain down her Rt leg first thing in the mornings and Rt leg feels weaker. She feels she is improving some with her endurance but still limited    Pertinent History  lumbar decomplression L4-5 laminectomy, facectomy, and foraminotomy on 08/30/18    Currently in Pain?  No/denies          [facility] Adult PT Treatment/Exercise - 10/26/18 0001      High Level Balance   High Level Balance Comments  tandem walk at counter top up/down  X3 then lateral step up and over onto airex X 10 each side no intermit UE support as needed      Exercises   Exercises  Lumbar      Lumbar Exercises: Stretches   Single Knee to Chest Stretch  Right;Left;2 reps;30 seconds    Other Lumbar Stretch Exercise  piriformis stretch 30 sec X 1 on Lt and 30 sec  X3 on Rt      Lumbar Exercises: Aerobic   Tread Mill  1.2-1.6 mph 5 min       Lumbar Exercises: Standing   Other Standing Lumbar Exercises  Standing H.S curls, standing hip abd and ext 2.5 lbs  X15 ea    Other Standing Lumbar Exercises  rows red X 20      Lumbar Exercises: Seated   Long Arc Quad on Chair  Both;20 reps    LAQ on Chair Weights (lbs)  2.5    Other Seated Lumbar Exercises  sit to stand no UE support X 10      Lumbar Exercises: Supine   Bridge  15 reps;5 seconds    Straight Leg Raise  15 reps      Manual Therapy   Manual therapy comments  manual H.S. stretching, KTC and hip PROM, long axis distraction             PT Education - 10/26/18 1640    Education Details  HEP review    Person(s) Educated  Patient    Methods  Explanation;Demonstration;Verbal cues    Comprehension  Verbalized understanding;Returned demonstration  PT Long Term Goals - 10/13/18 0834      PT LONG TERM GOAL #1   Title  Patient will return to her exercise program without pain     Time  6    Period  Weeks    Status  New      PT LONG TERM GOAL #2   Title  Patient will go up/down steps without pain     Time  6    Period  Weeks    Status  New      PT LONG TERM GOAL #3   Title  Patient will ambualte 3000' without AD or pain in order to perfrom ADL's     Time  6    Period  Weeks    Status  New      PT LONG TERM GOAL #4   Title  She will improve lumbar ROM to [facility] (at least 75% all planes)    Status  New      PT LONG TERM GOAL #5   Title  She will improve FGA to at least 22/30 to show improved balance    Baseline  18    Status  New            Plan - 10/26/18 1641    Clinical Impression Statement  She is overall doing well S/P back surgery. Session focused on improving Rt leg strenght, lumbar stretching, and balance today with good tolerance and without complaints. PT will progress as able.    Examination-Activity Limitations  Locomotion Level;Carry;Squat;Stairs;Stand;Lift     Examination-Participation Restrictions  Meal Prep;Cleaning;Community Activity;Laundry;Yard Work;Shop    Stability/Clinical Decision Making  Evolving/Moderate complexity    Rehab Potential  Excellent    PT Frequency  2x / week    PT Duration  6 weeks    PT Treatment/Interventions  ADLs/Self Care Home Management;Aquatic Therapy;Cryotherapy;Electrical Stimulation;Iontophoresis 4mg /ml Dexamethasone;Moist Heat;Traction;Ultrasound;Gait training;Stair training;Therapeutic activities;Therapeutic exercise;Balance training;Neuromuscular re-education;Manual techniques;Passive range of motion;Joint Manipulations;Spinal Manipulations    PT Next Visit Plan  needs lumbar and Hip stretching and strengthening, dynamic balance and gait.    PT Home Exercise Plan  Access Code: [email or web address]       Patient will benefit from skilled therapeutic intervention in order to improve the following deficits and impairments:  Abnormal gait, Decreased activity tolerance, Decreased balance, Decreased endurance, Decreased range of motion, Decreased strength, Difficulty walking, Pain  Visit Diagnosis: Chronic bilateral low back pain without sciatica  Other abnormalities of gait and mobility  Muscle weakness (generalized)  Pain in left hip     Problem List Patient Active Problem List   Diagnosis Date Noted  . HYPERLIPIDEMIA 04/18/2007  . ALLERGIC RHINITIS 04/18/2007  . PULMONARY NODULE 04/18/2007  . CARCINOMA, RECTUM 04/17/2007  . ANXIETY 04/17/2007  . HYPERTENSION 04/17/2007  . COLITIS, ULCERATIVE 04/17/2007    [name] 10/26/2018, 4:53 PM  [facility] [address] [place], [place], [address] Phone: [phone]   Fax:  [phone]  Name: Sandy Trujillo MRN: [id number] Date of Birth: 09/06/1932

## 2018-10-31 ENCOUNTER — Ambulatory Visit: Payer: Medicare Other | Admitting: Physical Therapy

## 2018-10-31 ENCOUNTER — Other Ambulatory Visit: Payer: Self-pay

## 2018-10-31 DIAGNOSIS — R2689 Other abnormalities of gait and mobility: Secondary | ICD-10-CM | POA: Diagnosis not present

## 2018-10-31 DIAGNOSIS — G8929 Other chronic pain: Secondary | ICD-10-CM | POA: Diagnosis not present

## 2018-10-31 DIAGNOSIS — M6281 Muscle weakness (generalized): Secondary | ICD-10-CM

## 2018-10-31 DIAGNOSIS — M25552 Pain in left hip: Secondary | ICD-10-CM

## 2018-10-31 DIAGNOSIS — M545 Low back pain, unspecified: Secondary | ICD-10-CM

## 2018-10-31 NOTE — Therapy (Signed)
[place], [place], [address] Phone: [phone]   Fax:  [phone]  Physical Therapy Treatment  Patient Details  Name: Sandy Trujillo MRN: [id number] Date of Birth: 1933-01-05 Referring Provider (PT): Earnie Larsson, MD   Encounter Date: 10/31/2018  PT End of Session - 10/31/18 1427    Visit Number  3    Number of Visits  12    Date for PT Re-Evaluation  11/24/18    Authorization Type  MCR/BCBS    PT Start Time  1400    PT Stop Time  1445    PT Time Calculation (min)  45 min    Activity Tolerance  Patient tolerated treatment well    Behavior During Therapy  [facility] for tasks assessed/performed       Past Medical History:  Diagnosis Date  . Hyperparathyroidism ([place]) 2019  . Ileostomy in place [facility])   . Osteoporosis 2019  . rectal ca 2007   surg only    Past Surgical History:  Procedure Laterality Date  . ABDOMINAL HYSTERECTOMY    . BREAST EXCISIONAL BIOPSY Bilateral    benign  . COLON SURGERY  2007    There were no vitals filed for this visit.  Subjective Assessment - 10/31/18 1421    Subjective  Relays having some pain on the Rt side of her back/hip however she has been walking fot 30 minutes for 10 straight days.    Pertinent History  lumbar decomplression L4-5 laminectomy, facectomy, and foraminotomy on 08/30/18    Limitations  Lifting;Standing;Walking;House hold activities    How long can you sit comfortably?  not limited    How long can you stand comfortably?  20 min    How long can you walk comfortably?  can get through the grocery store but legs and back get real tired after this    Diagnostic tests  MRI    Patient Stated Goals  improve balance, strength, and tightness in her back    Pain Onset  More than a month ago           [facility] Adult PT Treatment/Exercise - 10/31/18 0001      Lumbar Exercises: Stretches   Single Knee to Chest Stretch  Right;Left;1 rep;30 seconds     Piriformis Stretch  Right;Left;2 reps;60 seconds      Lumbar Exercises: Aerobic   Recumbent Bike  5 min no resistance      Lumbar Exercises: Machines for Strengthening   Leg Press  45 lbs 2X10      Lumbar Exercises: Seated   Other Seated Lumbar Exercises  hip IR/ER X 15 bilat with red      Lumbar Exercises: Supine   Bridge  15 reps;5 seconds    Straight Leg Raise  15 reps    Other Supine Lumbar Exercises  clam green X 20      Manual Therapy   Manual therapy comments  manual H.S. stretching, KTC and hip PROM, long axis distraction             PT Education - 10/31/18 1427    Education Details  rationale for shoe lift due to LLD    Person(s) Educated  Patient    Methods  Explanation    Comprehension  Verbalized understanding          PT Long Term Goals - 10/13/18 0834      PT LONG TERM GOAL #1   Title  Patient will return to  her exercise program without pain     Time  6    Period  Weeks    Status  New      PT LONG TERM GOAL #2   Title  Patient will go up/down steps without pain     Time  6    Period  Weeks    Status  New      PT LONG TERM GOAL #3   Title  Patient will ambualte 3000' without AD or pain in order to perfrom ADL's     Time  6    Period  Weeks    Status  New      PT LONG TERM GOAL #4   Title  She will improve lumbar ROM to [facility] (at least 75% all planes)    Status  New      PT LONG TERM GOAL #5   Title  She will improve FGA to at least 22/30 to show improved balance    Baseline  18    Status  New            Plan - 10/31/18 1443    Clinical Impression Statement  She has more pain today but it may be due to incrased activity level. Cautioned her about over actitivy and to reduce activity every other day for now. Also discovered she has Rt LLD (Rt leg longer by about 1/2 inch so was instructed to try walking with heel lift in her Lt shoe). continue POC    Examination-Activity Limitations  Locomotion Level;Carry;Squat;Stairs;Stand;Lift     Examination-Participation Restrictions  Meal Prep;Cleaning;Community Activity;Laundry;Yard Work;Shop    Stability/Clinical Decision Making  Evolving/Moderate complexity    Rehab Potential  Excellent    PT Frequency  2x / week    PT Duration  6 weeks    PT Treatment/Interventions  ADLs/Self Care Home Management;Aquatic Therapy;Cryotherapy;Electrical Stimulation;Iontophoresis 4mg /ml Dexamethasone;Moist Heat;Traction;Ultrasound;Gait training;Stair training;Therapeutic activities;Therapeutic exercise;Balance training;Neuromuscular re-education;Manual techniques;Passive range of motion;Joint Manipulations;Spinal Manipulations    PT Next Visit Plan  how was heel lift? needs lumbar and Hip stretching and strengthening, dynamic balance and gait.    PT Home Exercise Plan  Access Code: [email or web address]       Patient will benefit from skilled therapeutic intervention in order to improve the following deficits and impairments:  Abnormal gait, Decreased activity tolerance, Decreased balance, Decreased endurance, Decreased range of motion, Decreased strength, Difficulty walking, Pain  Visit Diagnosis: Chronic bilateral low back pain without sciatica  Other abnormalities of gait and mobility  Muscle weakness (generalized)  Pain in left hip     Problem List Patient Active Problem List   Diagnosis Date Noted  . HYPERLIPIDEMIA 04/18/2007  . ALLERGIC RHINITIS 04/18/2007  . PULMONARY NODULE 04/18/2007  . CARCINOMA, RECTUM 04/17/2007  . ANXIETY 04/17/2007  . HYPERTENSION 04/17/2007  . COLITIS, ULCERATIVE 04/17/2007    [name] 10/31/2018, 2:46 PM  [facility] [address] [place], [place], [address] Phone: [phone]   Fax:  [phone]  Name: Sandy Trujillo MRN: [id number] Date of Birth: [birth year]

## 2018-11-02 ENCOUNTER — Ambulatory Visit: Payer: Medicare Other | Admitting: Physical Therapy

## 2018-11-02 ENCOUNTER — Other Ambulatory Visit: Payer: Self-pay

## 2018-11-02 DIAGNOSIS — G8929 Other chronic pain: Secondary | ICD-10-CM | POA: Diagnosis not present

## 2018-11-02 DIAGNOSIS — M545 Low back pain, unspecified: Secondary | ICD-10-CM

## 2018-11-02 DIAGNOSIS — M25552 Pain in left hip: Secondary | ICD-10-CM | POA: Diagnosis not present

## 2018-11-02 DIAGNOSIS — M6281 Muscle weakness (generalized): Secondary | ICD-10-CM

## 2018-11-02 DIAGNOSIS — R2689 Other abnormalities of gait and mobility: Secondary | ICD-10-CM | POA: Diagnosis not present

## 2018-11-02 NOTE — Therapy (Signed)
[place], [place], [address] Phone: [phone]   Fax:  [phone]  Physical Therapy Treatment  Patient Details  Name: Sandy CAYTON MRN: [id number] Date of Birth: [birth year] Referring Provider (PT): Earnie Larsson, MD   Encounter Date: 11/02/2018  PT End of Session - 11/02/18 1633    Visit Number  4    Number of Visits  12    Date for PT Re-Evaluation  11/24/18    Authorization Type  MCR/BCBS    PT Start Time  1530    PT Stop Time  1615    PT Time Calculation (min)  45 min    Activity Tolerance  Patient tolerated treatment well    Behavior During Therapy  [facility] for tasks assessed/performed       Past Medical History:  Diagnosis Date  . Hyperparathyroidism ([place]) 2019  . Ileostomy in place [facility])   . Osteoporosis 2019  . rectal ca 2007   surg only    Past Surgical History:  Procedure Laterality Date  . ABDOMINAL HYSTERECTOMY    . BREAST EXCISIONAL BIOPSY Bilateral    benign  . COLON SURGERY  2007    There were no vitals filed for this visit.  Subjective Assessment - 11/02/18 1624    Subjective  She has backed off some of how much she was walking per PT recommendations and now is not having back or hip pain today. "I really felt good after last session"    Pertinent History  lumbar decomplression L4-5 laminectomy, facectomy, and foraminotomy on 08/30/18    Limitations  Lifting;Standing;Walking;House hold activities    How long can you sit comfortably?  not limited    How long can you stand comfortably?  20 min    How long can you walk comfortably?  can get through the grocery store but legs and back get real tired after this    Diagnostic tests  MRI    Patient Stated Goals  improve balance, strength, and tightness in her back    Currently in Pain?  No/denies    Pain Onset  More than a month ago                       [facility] Adult PT Treatment/Exercise - 11/02/18 0001      Lumbar Exercises: Stretches   Single Knee to Chest Stretch  Right;Left;1 rep;30 seconds    Piriformis Stretch  Right;Left;2 reps;60 seconds      Lumbar Exercises: Aerobic   Tread Mill  1.4-1.8 mph 6 min       Lumbar Exercises: Machines for Strengthening   Leg Press  45 lbs 2X10      Lumbar Exercises: Seated   Other Seated Lumbar Exercises  hip IR/ER X 15 bilat with red      Lumbar Exercises: Supine   Bridge  15 reps;5 seconds    Straight Leg Raise  15 reps      Lumbar Exercises: Sidelying   Clam  15 reps;Both    Other Sidelying Lumbar Exercises  hip IR reps bilat      Manual Therapy   Manual therapy comments  manual H.S. stretching, KTC and hip PROM, long axis distraction                  PT Long Term Goals - 10/13/18 0834      PT LONG TERM GOAL #1   Title  Patient will return to  her exercise program without pain     Time  6    Period  Weeks    Status  New      PT LONG TERM GOAL #2   Title  Patient will go up/down steps without pain     Time  6    Period  Weeks    Status  New      PT LONG TERM GOAL #3   Title  Patient will ambualte 3000' without AD or pain in order to perfrom ADL's     Time  6    Period  Weeks    Status  New      PT LONG TERM GOAL #4   Title  She will improve lumbar ROM to [facility] (at least 75% all planes)    Status  New      PT LONG TERM GOAL #5   Title  She will improve FGA to at least 22/30 to show improved balance    Baseline  18    Status  New            Plan - 11/02/18 1643    Clinical Impression Statement  Less pain and able to progress back in some therex today. Continued manual therapy for positive return. She started wearing heel lift which may be helping also    Examination-Activity Limitations  Locomotion Level;Carry;Squat;Stairs;Stand;Lift    Examination-Participation Restrictions  Meal Prep;Cleaning;Community Activity;Laundry;Yard Work;Shop    Stability/Clinical Decision Making  Evolving/Moderate complexity     Rehab Potential  Excellent    PT Frequency  2x / week    PT Duration  6 weeks    PT Treatment/Interventions  ADLs/Self Care Home Management;Aquatic Therapy;Cryotherapy;Electrical Stimulation;Iontophoresis 4mg /ml Dexamethasone;Moist Heat;Traction;Ultrasound;Gait training;Stair training;Therapeutic activities;Therapeutic exercise;Balance training;Neuromuscular re-education;Manual techniques;Passive range of motion;Joint Manipulations;Spinal Manipulations    PT Next Visit Plan  how was heel lift? needs lumbar and Hip stretching and strengthening, dynamic balance and gait.    PT Home Exercise Plan  Access Code: [email or web address]       Patient will benefit from skilled therapeutic intervention in order to improve the following deficits and impairments:  Abnormal gait, Decreased activity tolerance, Decreased balance, Decreased endurance, Decreased range of motion, Decreased strength, Difficulty walking, Pain  Visit Diagnosis: Chronic bilateral low back pain without sciatica  Other abnormalities of gait and mobility  Muscle weakness (generalized)  Pain in left hip     Problem List Patient Active Problem List   Diagnosis Date Noted  . HYPERLIPIDEMIA 04/18/2007  . ALLERGIC RHINITIS 04/18/2007  . PULMONARY NODULE 04/18/2007  . CARCINOMA, RECTUM 04/17/2007  . ANXIETY 04/17/2007  . HYPERTENSION 04/17/2007  . COLITIS, ULCERATIVE 04/17/2007    Sandy Trujillo 11/02/2018, 4:55 PM  [facility] [address] [place], [place], [address] Phone: [phone]   Fax:  [phone]  Name: Sandy Trujillo MRN: [id number] Date of Birth: [birth year]

## 2018-11-06 DIAGNOSIS — I10 Essential (primary) hypertension: Secondary | ICD-10-CM | POA: Diagnosis not present

## 2018-11-07 ENCOUNTER — Other Ambulatory Visit: Payer: Self-pay

## 2018-11-07 ENCOUNTER — Ambulatory Visit: Payer: Medicare Other | Admitting: Physical Therapy

## 2018-11-07 DIAGNOSIS — M545 Low back pain, unspecified: Secondary | ICD-10-CM

## 2018-11-07 DIAGNOSIS — M25552 Pain in left hip: Secondary | ICD-10-CM | POA: Diagnosis not present

## 2018-11-07 DIAGNOSIS — M6281 Muscle weakness (generalized): Secondary | ICD-10-CM

## 2018-11-07 DIAGNOSIS — R2689 Other abnormalities of gait and mobility: Secondary | ICD-10-CM | POA: Diagnosis not present

## 2018-11-07 DIAGNOSIS — G8929 Other chronic pain: Secondary | ICD-10-CM

## 2018-11-07 NOTE — Therapy (Signed)
[place], [place], [address] Phone: [phone]   Fax:  [phone]  Physical Therapy Treatment  Patient Details  Name: Sandy Trujillo MRN: [id number] Date of Birth: Jun 28, 1932 Referring Provider (PT): Earnie Larsson, MD   Encounter Date: 11/07/2018  PT End of Session - 11/07/18 1615    Visit Number  5    Number of Visits  12    Date for PT Re-Evaluation  11/24/18    Authorization Type  MCR/BCBS    PT Start Time  [id number]    PT Stop Time  1530    PT Time Calculation (min)  45 min    Activity Tolerance  Patient tolerated treatment well    Behavior During Therapy  [facility] for tasks assessed/performed       Past Medical History:  Diagnosis Date  . Hyperparathyroidism ([place]) 2019  . Ileostomy in place [facility])   . Osteoporosis 2019  . rectal ca 2007   surg only    Past Surgical History:  Procedure Laterality Date  . ABDOMINAL HYSTERECTOMY    . BREAST EXCISIONAL BIOPSY Bilateral    benign  . COLON SURGERY  2007    There were no vitals filed for this visit.  Subjective Assessment - 11/07/18 1604    Subjective  Soreness and pain in her Rt leg today 4/10    Pertinent History  lumbar decomplression L4-5 laminectomy, facectomy, and foraminotomy on 08/30/18    Limitations  Lifting;Standing;Walking;House hold activities    How long can you sit comfortably?  not limited    How long can you stand comfortably?  20 min    How long can you walk comfortably?  can get through the grocery store but legs and back get real tired after this    Diagnostic tests  MRI    Patient Stated Goals  improve balance, strength, and tightness in her back    Pain Onset  More than a month ago                       [facility] Adult PT Treatment/Exercise - 11/07/18 0001      Lumbar Exercises: Stretches   Active Hamstring Stretch  Right;Left;2 reps;30 seconds    ITB Stretch  Right;Left;2 reps;30 seconds    Piriformis  Stretch  Right;Left;2 reps;60 seconds      Lumbar Exercises: Aerobic   Tread Mill  1.6-1.8 mph 6 min       Lumbar Exercises: Supine   Bridge  15 reps;5 seconds    Straight Leg Raise  15 reps      Lumbar Exercises: Sidelying   Clam  15 reps;Both    Hip Abduction  Both;20 reps    Other Sidelying Lumbar Exercises  hip IR reps bilat      Manual Therapy   Manual therapy comments  manual stretching for KTC and hip PROM, long axis distraction, then STM and roller stick to posterior and lateral hips and hamstings                  PT Long Term Goals - 10/13/18 0834      PT LONG TERM GOAL #1   Title  Patient will return to her exercise program without pain     Time  6    Period  Weeks    Status  New      PT LONG TERM GOAL #2   Title  Patient will go up/down  steps without pain     Time  6    Period  Weeks    Status  New      PT LONG TERM GOAL #3   Title  Patient will ambualte 3000' without AD or pain in order to perfrom ADL's     Time  6    Period  Weeks    Status  New      PT LONG TERM GOAL #4   Title  She will improve lumbar ROM to [facility] (at least 75% all planes)    Status  New      PT LONG TERM GOAL #5   Title  She will improve FGA to at least 22/30 to show improved balance    Baseline  18    Status  New            Plan - 11/07/18 1615    Clinical Impression Statement  More hip/posteror leg pain today but on Rt side. Not sure if this is due to overuse or muscular because she has been walking more since surgery however has dialed it back per PT recommendation. She brings in heel lift but it may be too short for her so encouraged to add a layer to this to try to see how it works. Continued with stretching and strengthening program with good tolerance    Examination-Activity Limitations  Locomotion Level;Carry;Squat;Stairs;Stand;Lift    Examination-Participation Restrictions  Meal Prep;Cleaning;Community Activity;Laundry;Yard Work;Shop    Stability/Clinical  Decision Making  Evolving/Moderate complexity    Rehab Potential  Excellent    PT Frequency  2x / week    PT Duration  6 weeks    PT Treatment/Interventions  ADLs/Self Care Home Management;Aquatic Therapy;Cryotherapy;Electrical Stimulation;Iontophoresis 4mg /ml Dexamethasone;Moist Heat;Traction;Ultrasound;Gait training;Stair training;Therapeutic activities;Therapeutic exercise;Balance training;Neuromuscular re-education;Manual techniques;Passive range of motion;Joint Manipulations;Spinal Manipulations    PT Next Visit Plan  how was heel lift? needs lumbar and Hip stretching and strengthening, dynamic balance and gait.    PT Home Exercise Plan  Access Code: [email or web address]       Patient will benefit from skilled therapeutic intervention in order to improve the following deficits and impairments:  Abnormal gait, Decreased activity tolerance, Decreased balance, Decreased endurance, Decreased range of motion, Decreased strength, Difficulty walking, Pain  Visit Diagnosis: Chronic bilateral low back pain without sciatica  Other abnormalities of gait and mobility  Muscle weakness (generalized)  Pain in left hip     Problem List Patient Active Problem List   Diagnosis Date Noted  . HYPERLIPIDEMIA 04/18/2007  . ALLERGIC RHINITIS 04/18/2007  . PULMONARY NODULE 04/18/2007  . CARCINOMA, RECTUM 04/17/2007  . ANXIETY 04/17/2007  . HYPERTENSION 04/17/2007  . COLITIS, ULCERATIVE 04/17/2007    [name] 11/07/2018, 4:30 PM  [place], [place], [address] Phone: [phone]   Fax:  [phone]  Name: Sandy Trujillo MRN: [id number] Date of Birth: April 08, 1932

## 2018-11-08 ENCOUNTER — Ambulatory Visit: Payer: Medicare Other

## 2018-11-09 ENCOUNTER — Ambulatory Visit: Payer: Medicare Other | Admitting: Physical Therapy

## 2018-11-09 ENCOUNTER — Other Ambulatory Visit: Payer: Self-pay

## 2018-11-09 DIAGNOSIS — G8929 Other chronic pain: Secondary | ICD-10-CM

## 2018-11-09 DIAGNOSIS — M6281 Muscle weakness (generalized): Secondary | ICD-10-CM

## 2018-11-09 DIAGNOSIS — R2689 Other abnormalities of gait and mobility: Secondary | ICD-10-CM | POA: Diagnosis not present

## 2018-11-09 DIAGNOSIS — M545 Low back pain, unspecified: Secondary | ICD-10-CM

## 2018-11-09 DIAGNOSIS — M25552 Pain in left hip: Secondary | ICD-10-CM | POA: Diagnosis not present

## 2018-11-09 NOTE — Therapy (Signed)
[place], [place], [address] Phone: [phone]   Fax:  [phone]  Physical Therapy Treatment  Patient Details  Name: Sandy Trujillo MRN: [id number] Date of Birth: 16-Feb-1932 Referring Provider (PT): Earnie Larsson, MD   Encounter Date: 11/09/2018  PT End of Session - 11/09/18 1655    Visit Number  6    Number of Visits  12    Date for PT Re-Evaluation  11/24/18    Authorization Type  MCR/BCBS    PT Start Time  [id number]    PT Stop Time  1615    PT Time Calculation (min)  45 min    Activity Tolerance  Patient tolerated treatment well    Behavior During Therapy  [facility] for tasks assessed/performed       Past Medical History:  Diagnosis Date  . Hyperparathyroidism ([place]) 2019  . Ileostomy in place [facility])   . Osteoporosis 2019  . rectal ca 2007   surg only    Past Surgical History:  Procedure Laterality Date  . ABDOMINAL HYSTERECTOMY    . BREAST EXCISIONAL BIOPSY Bilateral    benign  . COLON SURGERY  2007    There were no vitals filed for this visit.  Subjective Assessment - 11/09/18 1541    Subjective  Her pain is down to 1/10 in her Rt leg.    Pertinent History  lumbar decomplression L4-5 laminectomy, facectomy, and foraminotomy on 08/30/18    Limitations  Lifting;Standing;Walking;House hold activities    How long can you sit comfortably?  not limited    How long can you stand comfortably?  20 min    How long can you walk comfortably?  can get through the grocery store but legs and back get real tired after this    Diagnostic tests  MRI    Patient Stated Goals  improve balance, strength, and tightness in her back    Pain Onset  More than a month ago                       [facility] Adult PT Treatment/Exercise - 11/09/18 0001      Lumbar Exercises: Stretches   Active Hamstring Stretch  Right;Left;2 reps;30 seconds    ITB Stretch  Right;Left;2 reps;30 seconds    Piriformis Stretch   Right;Left;2 reps;60 seconds    Piriformis Stretch Limitations  pushing knee down vs to opposite shoulder as relays less aggrivation with this      Lumbar Exercises: Aerobic   Tread Mill  1.6-1.8 mph 8 min       Lumbar Exercises: Standing   Other Standing Lumbar Exercises  lateral step ups onto 6 inch box up and over to other side X 5 ea with UE support at counter top      Lumbar Exercises: Supine   Bridge  15 reps;5 seconds    Straight Leg Raise  15 reps      Lumbar Exercises: Sidelying   Clam  15 reps;Both    Hip Abduction  Both;20 reps    Other Sidelying Lumbar Exercises  hip IR reps bilat      Manual Therapy   Manual therapy comments  manual stretching for KTC and hip PROM, long axis distraction, then STM and roller stick to posterior and lateral hips and hamstings                  PT Long Term Goals - 11/09/18 1658  PT LONG TERM GOAL #1   Title  Patient will return to her exercise program without pain     Time  6    Period  Weeks    Status  On-going      PT LONG TERM GOAL #2   Title  Patient will go up/down steps without pain     Time  6    Period  Weeks    Status  On-going      PT LONG TERM GOAL #3   Title  Patient will ambualte 3000' without AD or pain in order to perfrom ADL's     Time  6    Period  Weeks    Status  On-going      PT LONG TERM GOAL #4   Title  She will improve lumbar ROM to [facility] (at least 75% all planes)    Status  On-going      PT LONG TERM GOAL #5   Title  She will improve FGA to at least 22/30 to show improved balance    Baseline  18    Status  On-going            Plan - 11/09/18 1656    Clinical Impression Statement  less hip pain today so able to progress her exercise plan some and revist standing closed chain exercise of lateral step up and over. She continues to show positive return from manual therapy and she is overall improving her activity tolerance.    Examination-Activity Limitations  Locomotion  Level;Carry;Squat;Stairs;Stand;Lift    Examination-Participation Restrictions  Meal Prep;Cleaning;Community Activity;Laundry;Yard Work;Shop    Stability/Clinical Decision Making  Evolving/Moderate complexity    Rehab Potential  Excellent    PT Frequency  2x / week    PT Duration  6 weeks    PT Treatment/Interventions  ADLs/Self Care Home Management;Aquatic Therapy;Cryotherapy;Electrical Stimulation;Iontophoresis 4mg /ml Dexamethasone;Moist Heat;Traction;Ultrasound;Gait training;Stair training;Therapeutic activities;Therapeutic exercise;Balance training;Neuromuscular re-education;Manual techniques;Passive range of motion;Joint Manipulations;Spinal Manipulations    PT Next Visit Plan  needs lumbar and Hip stretching and strengthening, dynamic balance and gait.    PT Home Exercise Plan  Access Code: [email or web address]       Patient will benefit from skilled therapeutic intervention in order to improve the following deficits and impairments:  Abnormal gait, Decreased activity tolerance, Decreased balance, Decreased endurance, Decreased range of motion, Decreased strength, Difficulty walking, Pain  Visit Diagnosis: Chronic bilateral low back pain without sciatica  Other abnormalities of gait and mobility  Muscle weakness (generalized)  Pain in left hip     Problem List Patient Active Problem List   Diagnosis Date Noted  . HYPERLIPIDEMIA 04/18/2007  . ALLERGIC RHINITIS 04/18/2007  . PULMONARY NODULE 04/18/2007  . CARCINOMA, RECTUM 04/17/2007  . ANXIETY 04/17/2007  . HYPERTENSION 04/17/2007  . COLITIS, ULCERATIVE 04/17/2007    [name] 11/09/2018, 4:59 PM  [facility] [address] [place], [place], [address] Phone: [phone]   Fax:  [phone]  Name: Sandy Trujillo MRN: [id number] Date of Birth: Dec 25, 1932

## 2018-11-13 ENCOUNTER — Encounter: Payer: Medicare Other | Admitting: Physical Therapy

## 2018-11-13 ENCOUNTER — Encounter: Payer: Self-pay | Admitting: Physical Therapy

## 2018-11-13 ENCOUNTER — Other Ambulatory Visit: Payer: Self-pay

## 2018-11-13 ENCOUNTER — Ambulatory Visit: Payer: Medicare Other | Admitting: Physical Therapy

## 2018-11-13 DIAGNOSIS — M6281 Muscle weakness (generalized): Secondary | ICD-10-CM

## 2018-11-13 DIAGNOSIS — M25552 Pain in left hip: Secondary | ICD-10-CM

## 2018-11-13 DIAGNOSIS — G8929 Other chronic pain: Secondary | ICD-10-CM | POA: Diagnosis not present

## 2018-11-13 DIAGNOSIS — M545 Low back pain, unspecified: Secondary | ICD-10-CM

## 2018-11-13 DIAGNOSIS — R2689 Other abnormalities of gait and mobility: Secondary | ICD-10-CM

## 2018-11-13 NOTE — Therapy (Signed)
[place], [place], [address] Phone: [phone]   Fax:  [phone]  Physical Therapy Treatment  Patient Details  Name: Sandy Trujillo MRN: [id number] Date of Birth: 17-Jan-1933 Referring Provider (PT): Earnie Larsson, MD   Encounter Date: 11/13/2018  PT End of Session - 11/13/18 1502    Visit Number  7    Number of Visits  12    Date for PT Re-Evaluation  11/24/18    Authorization Type  MCR/BCBS    PT Start Time  1445    PT Stop Time  1500    PT Time Calculation (min)  15 min    Activity Tolerance  Patient tolerated treatment well    Behavior During Therapy  [facility] for tasks assessed/performed       Past Medical History:  Diagnosis Date  . Hyperparathyroidism ([place]) 2019  . Ileostomy in place [facility])   . Osteoporosis 2019  . rectal ca 2007   surg only    Past Surgical History:  Procedure Laterality Date  . ABDOMINAL HYSTERECTOMY    . BREAST EXCISIONAL BIOPSY Bilateral    benign  . COLON SURGERY  2007    There were no vitals filed for this visit.  Subjective Assessment - 11/13/18 1419    Subjective  " the pain from the inital issue is much better, The right thigh feels sore when I first stand up and walk it is sore"    Patient Stated Goals  improve balance, strength, and tightness in her back    Currently in Pain?  Yes    Pain Score  0-No pain    Pain Location  Back    Pain Orientation  Right    Pain Descriptors / Indicators  Tightness;Sore    Pain Type  Surgical pain    Pain Onset  More than a month ago    Pain Frequency  Intermittent    Aggravating Factors   prolonged walking    Pain Relieving Factors  rest, stretching    Multiple Pain Sites  Yes    Pain Score  3    Pain Location  Hip    Pain Orientation  Right    Pain Descriptors / Indicators  Tightness;Sore    Pain Type  Chronic pain    Pain Onset  More than a month ago    Pain Frequency  Intermittent    Aggravating Factors    first getting up and walking    Pain Relieving Factors  increaesd walking                       [facility] Adult PT Treatment/Exercise - 11/13/18 0001      Lumbar Exercises: Stretches   Hip Flexor Stretch  2 reps;30 seconds    ITB Stretch  2 reps;30 seconds   standing     Lumbar Exercises: Aerobic   Nustep  L5 x 5 min       Lumbar Exercises: Standing   Other Standing Lumbar Exercises  forward step up 2 x 10 with R HHA for safety 2 x 10   pt able to perform with redirection of focus     Lumbar Exercises: Seated   Sit to Stand  5 reps   x 3 sets   Sit to Stand Limitations  with L foot slightly forward to place emphasis on RLE with ball between the knees      Lumbar Exercises: Supine  Other Supine Lumbar Exercises  clam blue X 20      Manual Therapy   Manual therapy comments  MTPR along the glute med x 3             PT Education - 11/13/18 1502    Education Details  updated HEP today    Person(s) Educated  Patient    Methods  Explanation;Verbal cues;Handout    Comprehension  Verbalized understanding;Verbal cues required          PT Long Term Goals - 11/09/18 1658      PT LONG TERM GOAL #1   Title  Patient will return to her exercise program without pain     Time  6    Period  Weeks    Status  On-going      PT LONG TERM GOAL #2   Title  Patient will go up/down steps without pain     Time  6    Period  Weeks    Status  On-going      PT LONG TERM GOAL #3   Title  Patient will ambualte 3000' without AD or pain in order to perfrom ADL's     Time  6    Period  Weeks    Status  On-going      PT LONG TERM GOAL #4   Title  She will improve lumbar ROM to [facility] (at least 75% all planes)    Status  On-going      PT LONG TERM GOAL #5   Title  She will improve FGA to at least 22/30 to show improved balance    Baseline  18    Status  On-going            Plan - 11/13/18 1511    Clinical Impression Statement  pt reports improvement in the LLE  reporting some soreness in the R glute med. utilized MTPR along the R glut emed and worked on strengthening the hip which she did well requring some redirection of focus with step up/down.    PT Treatment/Interventions  ADLs/Self Care Home Management;Aquatic Therapy;Cryotherapy;Electrical Stimulation;Iontophoresis 4mg /ml Dexamethasone;Moist Heat;Traction;Ultrasound;Gait training;Stair training;Therapeutic activities;Therapeutic exercise;Balance training;Neuromuscular re-education;Manual techniques;Passive range of motion;Joint Manipulations;Spinal Manipulations    PT Next Visit Plan  needs lumbar and Hip stretching and strengthening, dynamic balance and gait.    Consulted and Agree with Plan of Care  Patient       Patient will benefit from skilled therapeutic intervention in order to improve the following deficits and impairments:  Abnormal gait, Decreased activity tolerance, Decreased balance, Decreased endurance, Decreased range of motion, Decreased strength, Difficulty walking, Pain  Visit Diagnosis: Chronic bilateral low back pain without sciatica  Other abnormalities of gait and mobility  Muscle weakness (generalized)  Pain in left hip     Problem List Patient Active Problem List   Diagnosis Date Noted  . HYPERLIPIDEMIA 04/18/2007  . ALLERGIC RHINITIS 04/18/2007  . PULMONARY NODULE 04/18/2007  . CARCINOMA, RECTUM 04/17/2007  . ANXIETY 04/17/2007  . HYPERTENSION 04/17/2007  . COLITIS, ULCERATIVE 04/17/2007   Starr Lake PT, DPT, LAT, ATC  11/13/18  3:14 PM      [facility] [address] [place], [place], [address] Phone: [phone]   Fax:  [phone]  Name: Sandy Trujillo MRN: [id number] Date of Birth: 10-13-1932

## 2018-11-15 ENCOUNTER — Other Ambulatory Visit: Payer: Self-pay

## 2018-11-15 ENCOUNTER — Encounter: Payer: Medicare Other | Admitting: Physical Therapy

## 2018-11-15 ENCOUNTER — Ambulatory Visit: Payer: Medicare Other | Admitting: Physical Therapy

## 2018-11-15 DIAGNOSIS — M545 Low back pain, unspecified: Secondary | ICD-10-CM

## 2018-11-15 DIAGNOSIS — G8929 Other chronic pain: Secondary | ICD-10-CM

## 2018-11-15 DIAGNOSIS — M25552 Pain in left hip: Secondary | ICD-10-CM | POA: Diagnosis not present

## 2018-11-15 DIAGNOSIS — R2689 Other abnormalities of gait and mobility: Secondary | ICD-10-CM

## 2018-11-15 DIAGNOSIS — M6281 Muscle weakness (generalized): Secondary | ICD-10-CM | POA: Diagnosis not present

## 2018-11-15 NOTE — Therapy (Signed)
[place], [place], [address] Phone: [phone]   Fax:  [phone]  Physical Therapy Treatment  Patient Details  Name: Sandy Trujillo MRN: [id number] Date of Birth: Aug 26, 1932 Referring Provider (PT): Earnie Larsson, MD   Encounter Date: 11/15/2018  PT End of Session - 11/15/18 1332    Visit Number  8    Number of Visits  12    Date for PT Re-Evaluation  11/24/18    Authorization Type  MCR/BCBS    PT Start Time  1325    PT Stop Time  1410    PT Time Calculation (min)  45 min    Activity Tolerance  Patient tolerated treatment well    Behavior During Therapy  [facility] for tasks assessed/performed       Past Medical History:  Diagnosis Date  . Hyperparathyroidism ([place]) 2019  . Ileostomy in place [facility])   . Osteoporosis 2019  . rectal ca 2007   surg only    Past Surgical History:  Procedure Laterality Date  . ABDOMINAL HYSTERECTOMY    . BREAST EXCISIONAL BIOPSY Bilateral    benign  . COLON SURGERY  2007    There were no vitals filed for this visit.      [facility] PT Assessment - 11/15/18 0001      Assessment   Medical Diagnosis  lumbar decompllumbar decomplression L4-5 laminectomy, facectomy, and foraminotomy on 08/30/18    Referring Provider (PT)  Earnie Larsson, MD                   [facility] Adult PT Treatment/Exercise - 11/15/18 0001      Exercises   Exercises  Knee/Hip      Lumbar Exercises: Stretches   Hip Flexor Stretch  2 reps;30 seconds    ITB Stretch  2 reps;30 seconds    Piriformis Stretch  2 reps;20 seconds;Right      Lumbar Exercises: Aerobic   Nustep  L5 x 5 min       Knee/Hip Exercises: Standing   Hip Abduction  2 sets;10 reps;Knee straight;Stengthening;Both   with red theraband   Hip Extension  2 sets;10 reps;Knee straight;Stengthening;Both   with red theraband     Knee/Hip Exercises: Sidelying   Other Sidelying Knee/Hip Exercises  reverse clam shell 2 x 12       Manual Therapy   Manual therapy comments  MTPR along the piriformis x 3          Balance Exercises - 11/15/18 1357      Balance Exercises: Standing   Standing Eyes Opened  2 reps;30 secs   in corner for safety   Tandem Stance  4 reps;30 secs   in corner for safety       PT Education - 11/15/18 1340    Education Details  reviewed previously provided HEP.    Person(s) Educated  Patient    Methods  Explanation;Verbal cues;Handout    Comprehension  Verbalized understanding;Verbal cues required          PT Long Term Goals - 11/09/18 1658      PT LONG TERM GOAL #1   Title  Patient will return to her exercise program without pain     Time  6    Period  Weeks    Status  On-going      PT LONG TERM GOAL #2   Title  Patient will go up/down steps without pain     Time  6    Period  Weeks    Status  On-going      PT LONG TERM GOAL #3   Title  Patient will ambualte 3000' without AD or pain in order to perfrom ADL's     Time  6    Period  Weeks    Status  On-going      PT LONG TERM GOAL #4   Title  She will improve lumbar ROM to [facility] (at least 75% all planes)    Status  On-going      PT LONG TERM GOAL #5   Title  She will improve FGA to at least 22/30 to show improved balance    Baseline  18    Status  On-going            Plan - 11/15/18 1413    Clinical Impression Statement  Mrs Sandy Trujillo reports improvement since the previous session, and arrived today without the use of her walking stick. continued STW along the piriformis followed wtih stretching. cotninued hip strengthening and balance training updating HEP for balance training maximizing safety.    PT Treatment/Interventions  ADLs/Self Care Home Management;Aquatic Therapy;Cryotherapy;Electrical Stimulation;Iontophoresis 4mg /ml Dexamethasone;Moist Heat;Traction;Ultrasound;Gait training;Stair training;Therapeutic activities;Therapeutic exercise;Balance training;Neuromuscular re-education;Manual  techniques;Passive range of motion;Joint Manipulations;Spinal Manipulations    PT Next Visit Plan  needs lumbar and Hip stretching and strengthening, dynamic balance and gait.    PT Home Exercise Plan  sit to stand, standing hip abd/ extension with resistances, corner balance rhomberg/tandem pos.       Patient will benefit from skilled therapeutic intervention in order to improve the following deficits and impairments:  Abnormal gait, Decreased activity tolerance, Decreased balance, Decreased endurance, Decreased range of motion, Decreased strength, Difficulty walking, Pain  Visit Diagnosis: Chronic bilateral low back pain without sciatica  Other abnormalities of gait and mobility  Muscle weakness (generalized)  Pain in left hip     Problem List Patient Active Problem List   Diagnosis Date Noted  . HYPERLIPIDEMIA 04/18/2007  . ALLERGIC RHINITIS 04/18/2007  . PULMONARY NODULE 04/18/2007  . CARCINOMA, RECTUM 04/17/2007  . ANXIETY 04/17/2007  . HYPERTENSION 04/17/2007  . COLITIS, ULCERATIVE 04/17/2007   Starr Lake PT, DPT, LAT, ATC  11/15/18  2:16 PM      [facility] [address] [place], [place], [address] Phone: [phone]   Fax:  [phone]  Name: Sandy Trujillo MRN: [id number] Date of Birth: 1933-01-07

## 2018-11-20 ENCOUNTER — Other Ambulatory Visit: Payer: Self-pay

## 2018-11-20 ENCOUNTER — Encounter: Payer: Self-pay | Admitting: Physical Therapy

## 2018-11-20 ENCOUNTER — Ambulatory Visit: Payer: Medicare Other | Admitting: Physical Therapy

## 2018-11-20 ENCOUNTER — Encounter: Payer: Medicare Other | Admitting: Physical Therapy

## 2018-11-20 DIAGNOSIS — M545 Low back pain, unspecified: Secondary | ICD-10-CM

## 2018-11-20 DIAGNOSIS — G8929 Other chronic pain: Secondary | ICD-10-CM | POA: Diagnosis not present

## 2018-11-20 DIAGNOSIS — M25552 Pain in left hip: Secondary | ICD-10-CM | POA: Diagnosis not present

## 2018-11-20 DIAGNOSIS — R2689 Other abnormalities of gait and mobility: Secondary | ICD-10-CM

## 2018-11-20 DIAGNOSIS — M6281 Muscle weakness (generalized): Secondary | ICD-10-CM

## 2018-11-20 NOTE — Therapy (Signed)
**Note Sandy-Identified via Obfuscation** Sandy Trujillo, [place], [address] Phone: [phone]   Fax:  [phone]  Physical Therapy Treatment  Patient Details  Name: [name] MRN: [id number] Date of Birth: Feb 11, 1932 Referring Provider (PT): Earnie Larsson, MD   Encounter Date: 11/20/2018  PT End of Session - 11/20/18 1641    Visit Number  9    Number of Visits  12    Date for PT Re-Evaluation  11/24/18    PT Start Time  [id number]    PT Stop Time  1500    PT Time Calculation (min)  45 min    Activity Tolerance  Patient tolerated treatment well    Behavior During Therapy  [facility] for tasks assessed/performed       Past Medical History:  Diagnosis Date  . Hyperparathyroidism ([place]) 2019  . Ileostomy in place [facility])   . Osteoporosis 2019  . rectal ca 2007   surg only    Past Surgical History:  Procedure Laterality Date  . ABDOMINAL HYSTERECTOMY    . BREAST EXCISIONAL BIOPSY Bilateral    benign  . COLON SURGERY  2007    There were no vitals filed for this visit.  Subjective Assessment - 11/20/18 1643    Subjective  "The exercises really help with the pain, I think it is more just weak.Pain is around a 1/10 "    Currently in Pain?  Yes    Pain Score  1     Pain Onset  More than a month ago    Pain Frequency  Intermittent    Aggravating Factors   getting up from sitting    Pain Relieving Factors  resting, stretch and exercise            Exercise/ balance     . Nu-step L5 x 5 min LE only . Low back stretch seated walking hands out on green physioball 2 x 30  . R ITB 2 x 30 stepping L over R and leaning to the L . Marching in place 2 x 10 while seated on dyna disc: cues to keep good posture  . Standing  marching with 1 HHA for stability: verbal cues to avoid hitting or slapping foot down 2 x 10 . Sit to stand with red band around knee 2 x 10 . Balance Rhomberg standing on airex pad 4 x 30 sec, 2 x with eyes  close                   PT Long Term Goals - 11/09/18 1658      PT LONG TERM GOAL #1   Title  Patient will return to her exercise program without pain     Time  6    Period  Weeks    Status  On-going      PT LONG TERM GOAL #2   Title  Patient will go up/down steps without pain     Time  6    Period  Weeks    Status  On-going      PT LONG TERM GOAL #3   Title  Patient will ambualte 3000' without AD or pain in order to perfrom ADL's     Time  6    Period  Weeks    Status  On-going      PT LONG TERM GOAL #4   Title  She will improve lumbar ROM to [facility] (at least 75% all planes)    Status  On-going      PT LONG TERM GOAL #5   Title  She will improve FGA to at least 22/30 to show improved balance    Baseline  18    Status  On-going            Plan - 11/20/18 1642    Clinical Impression Statement  pt reports improvement in pain noting it to be more intermittent now. Continued focus on hip/ knee strengthening which she did well with. She does continue to try to wedge her knees against the chair/ table with sit to stand which she is able to correct with cues. Continued working static and working into Veterinary surgeon in place with emphasis on control    PT Treatment/Interventions  ADLs/Self Care Home Management;Aquatic Therapy;Cryotherapy;Electrical Stimulation;Iontophoresis 4mg /ml Dexamethasone;Moist Heat;Traction;Ultrasound;Gait training;Stair training;Therapeutic activities;Therapeutic exercise;Balance training;Neuromuscular re-education;Manual techniques;Passive range of motion;Joint Manipulations;Spinal Manipulations    PT Next Visit Plan  back an dhip stretchingdynamic balance and gait. hip strengthening    PT Home Exercise Plan  sit to stand, standing hip abd/ extension with resistances, corner balance rhomberg/tandem pos.    Consulted and Agree with Plan of Care  Patient       Patient will benefit from skilled therapeutic intervention in  order to improve the following deficits and impairments:  Abnormal gait, Decreased activity tolerance, Decreased balance, Decreased endurance, Decreased range of motion, Decreased strength, Difficulty walking, Pain  Visit Diagnosis: Chronic bilateral low back pain without sciatica  Other abnormalities of gait and mobility  Muscle weakness (generalized)  Pain in left hip     Problem List Patient Active Problem List   Diagnosis Date Noted  . HYPERLIPIDEMIA 04/18/2007  . ALLERGIC RHINITIS 04/18/2007  . PULMONARY NODULE 04/18/2007  . CARCINOMA, RECTUM 04/17/2007  . ANXIETY 04/17/2007  . HYPERTENSION 04/17/2007  . COLITIS, ULCERATIVE 04/17/2007    Starr Lake PT, DPT, LAT, ATC  11/20/18  4:44 PM      [facility] [address] [place], [place], [address] Phone: [phone]   Fax:  [phone]  Name: Sandy Trujillo MRN: [id number] Date of Birth: 03/10/1932

## 2018-11-22 ENCOUNTER — Encounter: Payer: Medicare Other | Admitting: Physical Therapy

## 2018-11-22 ENCOUNTER — Ambulatory Visit: Payer: Medicare Other | Admitting: Physical Therapy

## 2018-11-22 ENCOUNTER — Other Ambulatory Visit: Payer: Self-pay

## 2018-11-22 DIAGNOSIS — M545 Low back pain, unspecified: Secondary | ICD-10-CM

## 2018-11-22 DIAGNOSIS — M25552 Pain in left hip: Secondary | ICD-10-CM | POA: Diagnosis not present

## 2018-11-22 DIAGNOSIS — M6281 Muscle weakness (generalized): Secondary | ICD-10-CM

## 2018-11-22 DIAGNOSIS — G8929 Other chronic pain: Secondary | ICD-10-CM

## 2018-11-22 DIAGNOSIS — R2689 Other abnormalities of gait and mobility: Secondary | ICD-10-CM | POA: Diagnosis not present

## 2018-11-22 NOTE — Therapy (Signed)
[place], [place], [address] Phone: [phone]   Fax:  [phone]  Physical Therapy Treatment / Re-certification Progress Note Reporting Period 10/12/2018  to 11/22/2018  See note below for Objective Data and Assessment of Progress/Goals.       Patient Details  Name: Sandy Trujillo MRN: [id number] Date of Birth: 03-22-1932 Referring Provider (PT): Earnie Larsson, MD   Encounter Date: 11/22/2018  PT End of Session - 11/22/18 1439    Visit Number  10    Number of Visits  14    Date for PT Re-Evaluation  12/20/18    Authorization Type  MCR/BCBS: KX mod at 15th visit, progress note at 20th visit    PT Start Time  1415    PT Stop Time  1454    PT Time Calculation (min)  39 min    Activity Tolerance  Patient tolerated treatment well    Behavior During Therapy  [facility] for tasks assessed/performed       Past Medical History:  Diagnosis Date  . Hyperparathyroidism ([place]) 2019  . Ileostomy in place [facility])   . Osteoporosis 2019  . rectal ca 2007   surg only    Past Surgical History:  Procedure Laterality Date  . ABDOMINAL HYSTERECTOMY    . BREAST EXCISIONAL BIOPSY Bilateral    benign  . COLON SURGERY  2007    There were no vitals filed for this visit.  Subjective Assessment - 11/22/18 1419    Subjective  " I am feeling some soreness today but its getting better"    Patient Stated Goals  improve balance, strength, and tightness in her back    Currently in Pain?  Yes    Pain Score  2     Pain Orientation  Right    Pain Descriptors / Indicators  Aching    Pain Type  Chronic pain    Pain Onset  More than a month ago    Pain Frequency  Intermittent         [facility] PT Assessment - 11/22/18 0001      Assessment   Medical Diagnosis  lumbar decompllumbar decomplression L4-5 laminectomy, facectomy, and foraminotomy on 08/30/18    Referring Provider (PT)  Earnie Larsson, MD    Onset Date/Surgical Date   08/30/18      AROM   Lumbar Flexion  50%    Lumbar Extension  75%    Lumbar - Right Side Bend  75%    Lumbar - Left Side Bend  75%    Lumbar - Right Rotation  75%      Strength   Strength Assessment Site  Hip    Right/Left Hip  Right;Left    Right Hip ABduction  4/5    Left Hip ABduction  4/5      Palpation   Palpation comment  TTP along R glute med/ piriformis                   [facility] Adult PT Treatment/Exercise - 11/22/18 0001      Lumbar Exercises: Aerobic   Nustep  L5 x 5 min LE only      Knee/Hip Exercises: Standing   Hip Abduction  2 sets;10 reps;Knee straight;Stengthening;Both   with red theraband   Hip Extension  2 sets;10 reps;Knee straight;Stengthening;Both   wiht red theraband   Gait Training  heel strike/ toe off gait pattern 2 x 20 ft with proper form  Manual Therapy   Manual Therapy  Joint mobilization    Manual therapy comments  MTPR along the glute med/ piriformis     Joint Mobilization  LAD of the RLE grade III                  PT Long Term Goals - 11/22/18 1442      PT LONG TERM GOAL #1   Title  Patient will return to her exercise program without pain     Period  Weeks    Status  On-going      PT LONG TERM GOAL #2   Title  Patient will go up/down steps without pain     Period  Weeks    Status  On-going      PT LONG TERM GOAL #3   Title  Patient will ambualte 3000' without AD or pain in order to perfrom ADL's     Period  Weeks    Status  On-going      PT LONG TERM GOAL #4   Title  She will improve lumbar ROM to [facility] (at least 75% all planes)    Period  Weeks    Status  On-going      PT LONG TERM GOAL #5   Title  She will improve FGA to at least 22/30 to show improved balance    Period  Weeks    Status  On-going            Plan - 11/22/18 1532    Clinical Impression Statement  [name] continues to make progress with physical therapy with reduce/ intermittent R hip pain and overall function. She is  progressing appropriately toward her goals. worked on hip strengthening, updating her HEP today. plan to continue with current treatment course to address hip strength, balance and remaining deficits and work toward discharge.    PT Frequency  2x / week    PT Duration  4 weeks    PT Treatment/Interventions  ADLs/Self Care Home Management;Aquatic Therapy;Cryotherapy;Electrical Stimulation;Iontophoresis 4mg /ml Dexamethasone;Moist Heat;Traction;Ultrasound;Gait training;Stair training;Therapeutic activities;Therapeutic exercise;Balance training;Neuromuscular re-education;Manual techniques;Passive range of motion;Joint Manipulations;Spinal Manipulations    PT Next Visit Plan  back an dhip stretching, dynamic balance and gait. hip strengthening, dynamic balance    PT Home Exercise Plan  sit to stand, standing hip abd/ extension with resistances, corner balance rhomberg/tandem pos.    Consulted and Agree with Plan of Care  Patient       Patient will benefit from skilled therapeutic intervention in order to improve the following deficits and impairments:  Abnormal gait, Decreased activity tolerance, Decreased balance, Decreased endurance, Decreased range of motion, Decreased strength, Difficulty walking, Pain  Visit Diagnosis: Chronic bilateral low back pain without sciatica  Other abnormalities of gait and mobility  Muscle weakness (generalized)  Pain in left hip     Problem List Patient Active Problem List   Diagnosis Date Noted  . HYPERLIPIDEMIA 04/18/2007  . ALLERGIC RHINITIS 04/18/2007  . PULMONARY NODULE 04/18/2007  . CARCINOMA, RECTUM 04/17/2007  . ANXIETY 04/17/2007  . HYPERTENSION 04/17/2007  . COLITIS, ULCERATIVE 04/17/2007   Starr Lake PT, DPT, LAT, ATC  11/22/18  3:40 PM      [facility] [address] [place], [place], [address] Phone: [phone]   Fax:  [phone]  Name: Sandy Trujillo MRN:  [id number] Date of Birth: [birth year]

## 2018-11-27 ENCOUNTER — Ambulatory Visit: Payer: Medicare Other | Admitting: Physical Therapy

## 2018-12-01 ENCOUNTER — Ambulatory Visit: Payer: Medicare Other | Admitting: Physical Therapy

## 2018-12-04 ENCOUNTER — Ambulatory Visit: Payer: Medicare Other | Admitting: Physical Therapy

## 2018-12-06 ENCOUNTER — Encounter: Payer: Self-pay | Admitting: Physical Therapy

## 2018-12-06 ENCOUNTER — Ambulatory Visit: Payer: Medicare Other | Attending: Geriatric Medicine | Admitting: Physical Therapy

## 2018-12-06 ENCOUNTER — Other Ambulatory Visit: Payer: Self-pay

## 2018-12-06 DIAGNOSIS — M6281 Muscle weakness (generalized): Secondary | ICD-10-CM

## 2018-12-06 DIAGNOSIS — M545 Low back pain, unspecified: Secondary | ICD-10-CM

## 2018-12-06 DIAGNOSIS — G8929 Other chronic pain: Secondary | ICD-10-CM | POA: Diagnosis not present

## 2018-12-06 DIAGNOSIS — M25552 Pain in left hip: Secondary | ICD-10-CM | POA: Diagnosis not present

## 2018-12-06 DIAGNOSIS — R2689 Other abnormalities of gait and mobility: Secondary | ICD-10-CM

## 2018-12-06 NOTE — Therapy (Signed)
[place], [place], [address] Phone: [phone]   Fax:  [phone]  Physical Therapy Treatment  Patient Details  Name: Sandy Trujillo MRN: [id number] Date of Birth: March 28, 1932 Referring Provider (PT): Earnie Larsson, MD   Encounter Date: 12/06/2018  PT End of Session - 12/06/18 1546    Visit Number  11    Number of Visits  14    Date for PT Re-Evaluation  12/20/18    Authorization Type  MCR/BCBS: KX mod at 15th visit, progress note at 20th visit    PT Start Time  1546    PT Stop Time  1628    PT Time Calculation (min)  42 min    Activity Tolerance  Patient tolerated treatment well    Behavior During Therapy  [facility] for tasks assessed/performed       Past Medical History:  Diagnosis Date  . Hyperparathyroidism ([place]) 2019  . Ileostomy in place [facility])   . Osteoporosis 2019  . rectal ca 2007   surg only    Past Surgical History:  Procedure Laterality Date  . ABDOMINAL HYSTERECTOMY    . BREAST EXCISIONAL BIOPSY Bilateral    benign  . COLON SURGERY  2007    There were no vitals filed for this visit.  Subjective Assessment - 12/06/18 1547    Subjective  "I am doing better, still haven't gotten the walking down yet"    How long can you walk comfortably?  can get through the grocery store but legs and back get real tired after this    Currently in Pain?  Yes    Pain Score  2     Pain Location  Hip    Pain Orientation  Right    Pain Descriptors / Indicators  Aching;Sore    Pain Type  Chronic pain    Pain Onset  More than a month ago    Pain Frequency  Intermittent    Aggravating Factors   getting up         [facility] PT Assessment - 12/06/18 0001      Assessment   Medical Diagnosis  lumbar decompllumbar decomplression L4-5 laminectomy, facectomy, and foraminotomy on 08/30/18    Referring Provider (PT)  Earnie Larsson, MD    Onset Date/Surgical Date  08/30/18                   [facility]  Adult PT Treatment/Exercise - 12/06/18 0001      Lumbar Exercises: Seated   Other Seated Lumbar Exercises  seated on dyna disc marching 2 x 10 cues to avoid letting the R foot slap      Knee/Hip Exercises: Seated   Long Arc Quad  2 sets;10 reps   with ball squeeez   Long Arc Quad Weight  4 lbs.      Knee/Hip Exercises: Supine   Bridges with Ball Squeeze  2 sets   12 reps     Knee/Hip Exercises: Sidelying   Hip ABduction  1 set;10 reps;Strengthening;Both    Other Sidelying Knee/Hip Exercises  reverse clam shell 2 x 12      Manual Therapy   Manual therapy comments  MTPR along the glute med/ piriformis and along vastus alteralis          Balance Exercises - 12/06/18 1637      Balance Exercises: Standing   Standing Eyes Closed  Narrow base of support (BOS);Foam/compliant surface;2 reps;30 secs    Partial  Tandem Stance  2 reps;30 secs;Eyes open    Heel Raises Limitations  2 x 10 on ariex pad             PT Long Term Goals - 11/22/18 1442      PT LONG TERM GOAL #1   Title  Patient will return to her exercise program without pain     Period  Weeks    Status  On-going      PT LONG TERM GOAL #2   Title  Patient will go up/down steps without pain     Period  Weeks    Status  On-going      PT LONG TERM GOAL #3   Title  Patient will ambualte 3000' without AD or pain in order to perfrom ADL's     Period  Weeks    Status  On-going      PT LONG TERM GOAL #4   Title  She will improve lumbar ROM to [facility] (at least 75% all planes)    Period  Weeks    Status  On-going      PT LONG TERM GOAL #5   Title  She will improve FGA to at least 22/30 to show improved balance    Period  Weeks    Status  On-going            Plan - 12/06/18 1714    Clinical Impression Statement  pt reports some soreness in the R hpi which she attributes to exercise yesterday. Cotninued STW focusing along glute med and vastus lateralis. continued strengtheing proximal hip muscles to promote  stability. continued balance training which she did well but has difficulty with tandem positioning so changed to modified tandem positiong which she improved with.    PT Treatment/Interventions  ADLs/Self Care Home Management;Aquatic Therapy;Cryotherapy;Electrical Stimulation;Iontophoresis 4mg /ml Dexamethasone;Moist Heat;Traction;Ultrasound;Gait training;Stair training;Therapeutic activities;Therapeutic exercise;Balance training;Neuromuscular re-education;Manual techniques;Passive range of motion;Joint Manipulations;Spinal Manipulations    PT Next Visit Plan  back an dhip stretching, dynamic balance and gait. hip strengthening, dynamic balance    PT Home Exercise Plan  sit to stand, standing hip abd/ extension with resistances, corner balance rhomberg/tandem pos.    Consulted and Agree with Plan of Care  Patient       Patient will benefit from skilled therapeutic intervention in order to improve the following deficits and impairments:  Abnormal gait, Decreased activity tolerance, Decreased balance, Decreased endurance, Decreased range of motion, Decreased strength, Difficulty walking, Pain  Visit Diagnosis: Chronic bilateral low back pain without sciatica  Other abnormalities of gait and mobility  Muscle weakness (generalized)  Pain in left hip     Problem List Patient Active Problem List   Diagnosis Date Noted  . HYPERLIPIDEMIA 04/18/2007  . ALLERGIC RHINITIS 04/18/2007  . PULMONARY NODULE 04/18/2007  . CARCINOMA, RECTUM 04/17/2007  . ANXIETY 04/17/2007  . HYPERTENSION 04/17/2007  . COLITIS, ULCERATIVE 04/17/2007   Starr Lake PT, DPT, LAT, ATC  12/06/18  5:18 PM      [facility] [address] [place], [place], [address] Phone: [phone]   Fax:  [phone]  Name: Sandy Trujillo MRN: [id number] Date of Birth: 1932-07-15

## 2018-12-26 ENCOUNTER — Ambulatory Visit: Payer: Medicare Other | Attending: Geriatric Medicine | Admitting: Physical Therapy

## 2018-12-26 ENCOUNTER — Encounter: Payer: Self-pay | Admitting: Physical Therapy

## 2018-12-26 ENCOUNTER — Other Ambulatory Visit: Payer: Self-pay

## 2018-12-26 DIAGNOSIS — M545 Low back pain, unspecified: Secondary | ICD-10-CM

## 2018-12-26 DIAGNOSIS — G8929 Other chronic pain: Secondary | ICD-10-CM

## 2018-12-26 DIAGNOSIS — M25552 Pain in left hip: Secondary | ICD-10-CM

## 2018-12-26 DIAGNOSIS — M6281 Muscle weakness (generalized): Secondary | ICD-10-CM

## 2018-12-26 DIAGNOSIS — R2689 Other abnormalities of gait and mobility: Secondary | ICD-10-CM

## 2018-12-26 NOTE — Therapy (Signed)
[place], [place], [address] Phone: [phone]   Fax:  [phone]  Physical Therapy Treatment / Discharge   Patient Details  Name: Sandy Trujillo MRN: [id number] Date of Birth: Oct 13, 1932 Referring Provider (PT): Earnie Larsson, MD   Encounter Date: 12/26/2018  PT End of Session - 12/26/18 1414    Visit Number  12    Number of Visits  14    Date for PT Re-Evaluation  12/26/18    Authorization Type  MCR/BCBS: KX mod at 15th visit, progress note at 20th visit    PT Start Time  1414    PT Stop Time  1453    PT Time Calculation (min)  39 min    Activity Tolerance  Patient tolerated treatment well    Behavior During Therapy  [facility] for tasks assessed/performed       Past Medical History:  Diagnosis Date  . Hyperparathyroidism ([place]) 2019  . Ileostomy in place [facility])   . Osteoporosis 2019  . rectal ca 2007   surg only    Past Surgical History:  Procedure Laterality Date  . ABDOMINAL HYSTERECTOMY    . BREAST EXCISIONAL BIOPSY Bilateral    benign  . COLON SURGERY  2007    There were no vitals filed for this visit.  Subjective Assessment - 12/26/18 1414    Subjective  "I am doing better, i've been doing the exercise and the pain is off and on now"    Patient Stated Goals  improve balance, strength, and tightness in her back    Currently in Pain?  Yes    Pain Score  0-No pain    Aggravating Factors   getting more sorneess in the calf with heel strike         [facility] PT Assessment - 12/26/18 0001      Assessment   Medical Diagnosis  lumbar decompllumbar decomplression L4-5 laminectomy, facectomy, and foraminotomy on 08/30/18    Referring Provider (PT)  Earnie Larsson, MD    Onset Date/Surgical Date  08/30/18                   [facility] Adult PT Treatment/Exercise - 12/26/18 0001      Knee/Hip Exercises: Seated   Other Seated Knee/Hip Exercises  heel / toe raise 2 x 10    Other Seated  Knee/Hip Exercises  4-way ankle strengthening 1 x 15 with red theraband    Marching  2 sets;10 reps   with red band around toes            PT Education - 12/26/18 1453    Education Details  updated HEP for ankle strengthening, reviewed previously provided HEP and how to progress with strengthening to maximize endurance.    Person(s) Educated  Patient    Methods  Explanation;Verbal cues;Handout    Comprehension  Verbalized understanding;Verbal cues required          PT Long Term Goals - 12/26/18 1428      PT LONG TERM GOAL #1   Title  Patient will return to her exercise program without pain     Period  Weeks    Status  Achieved      PT LONG TERM GOAL #2   Title  Patient will go up/down steps without pain     Period  Weeks    Status  Achieved      PT LONG TERM GOAL #3   Title  Patient  will ambualte 3000' without AD or pain in order to perfrom ADL's     Period  Weeks    Status  Achieved      PT LONG TERM GOAL #4   Title  She will improve lumbar ROM to [facility] (at least 75% all planes)    Period  Weeks    Status  Achieved      PT LONG TERM GOAL #5   Title  She will improve FGA to at least 22/30 to show improved balance    Period  Weeks    Status  Achieved            Plan - 12/26/18 1454    Clinical Impression Statement  Mrs Landowski reports consistency with her HEP and notes she is doing much better with minor soreness in the foot that only occurs with inital step with walking. She was able to do all exercises today and focused on ankle strengthening which she noted singificant relief after with walking/ standing. she met all goals today and is able to maintain and progress her current level of function independently and will be discharged from PT today.    PT Treatment/Interventions  ADLs/Self Care Home Management;Aquatic Therapy;Cryotherapy;Electrical Stimulation;Iontophoresis 88m/ml Dexamethasone;Moist Heat;Traction;Ultrasound;Gait training;Stair  training;Therapeutic activities;Therapeutic exercise;Balance training;Neuromuscular re-education;Manual techniques;Passive range of motion;Joint Manipulations;Spinal Manipulations    PT Next Visit Plan  D/C    Consulted and Agree with Plan of Care  Patient       Patient will benefit from skilled therapeutic intervention in order to improve the following deficits and impairments:  Abnormal gait, Decreased activity tolerance, Decreased balance, Decreased endurance, Decreased range of motion, Decreased strength, Difficulty walking, Pain  Visit Diagnosis: Chronic bilateral low back pain without sciatica  Other abnormalities of gait and mobility  Muscle weakness (generalized)  Pain in left hip     Problem List Patient Active Problem List   Diagnosis Date Noted  . HYPERLIPIDEMIA 04/18/2007  . ALLERGIC RHINITIS 04/18/2007  . PULMONARY NODULE 04/18/2007  . CARCINOMA, RECTUM 04/17/2007  . ANXIETY 04/17/2007  . HYPERTENSION 04/17/2007  . COLITIS, ULCERATIVE 04/17/2007    LStarr Lake12/01/2018, 2:57 PM  [facility] [address]Phone: [phone]  Fax:  [phone] Name: Sandy FREEBURGMRN: [id number] of Birth: 123-Dec-1934     PHYSICAL THERAPY DISCHARGE SUMMARY  Visits from Start of Care: 12  Current functional level related to goals / functional outcomes: See goals,   Remaining deficits: Intermittent stiffness in the hip, and soreness in the foot with initial heel strike when beginning to walk. She noted decreased pain following ankle strengthening   Education / Equipment: HEP, theraband, posture, pain education  Plan: Patient agrees to discharge.  Patient goals were met. Patient is being discharged due to meeting the stated rehab goals.  ?????         Yula Crotwell PT, DPT, LAT, ATC  12/26/18  2:58 PM

## 2019-01-02 ENCOUNTER — Encounter: Payer: Medicare Other | Admitting: Physical Therapy

## 2019-01-09 ENCOUNTER — Other Ambulatory Visit: Payer: Self-pay | Admitting: Geriatric Medicine

## 2019-01-09 ENCOUNTER — Other Ambulatory Visit: Payer: Self-pay | Admitting: Obstetrics and Gynecology

## 2019-01-09 ENCOUNTER — Other Ambulatory Visit: Payer: Self-pay | Admitting: Internal Medicine

## 2019-01-09 ENCOUNTER — Encounter: Payer: Medicare Other | Admitting: Physical Therapy

## 2019-01-09 DIAGNOSIS — E042 Nontoxic multinodular goiter: Secondary | ICD-10-CM

## 2019-01-09 DIAGNOSIS — M810 Age-related osteoporosis without current pathological fracture: Secondary | ICD-10-CM

## 2019-01-16 ENCOUNTER — Encounter: Payer: Medicare Other | Admitting: Physical Therapy

## 2019-01-23 ENCOUNTER — Ambulatory Visit
Admission: RE | Admit: 2019-01-23 | Discharge: 2019-01-23 | Disposition: A | Discharge status: Home / Self Care | Payer: Medicare Other | Source: Ambulatory Visit | Attending: Internal Medicine | Admitting: Internal Medicine

## 2019-01-23 DIAGNOSIS — E042 Nontoxic multinodular goiter: Secondary | ICD-10-CM

## 2019-02-19 DIAGNOSIS — Z961 Presence of intraocular lens: Secondary | ICD-10-CM | POA: Diagnosis not present

## 2019-02-19 DIAGNOSIS — H16143 Punctate keratitis, bilateral: Secondary | ICD-10-CM | POA: Diagnosis not present

## 2019-02-19 DIAGNOSIS — H52203 Unspecified astigmatism, bilateral: Secondary | ICD-10-CM | POA: Diagnosis not present

## 2019-02-19 DIAGNOSIS — H524 Presbyopia: Secondary | ICD-10-CM | POA: Diagnosis not present

## 2019-02-19 DIAGNOSIS — H5203 Hypermetropia, bilateral: Secondary | ICD-10-CM | POA: Diagnosis not present

## 2019-02-28 DIAGNOSIS — E213 Hyperparathyroidism, unspecified: Secondary | ICD-10-CM | POA: Diagnosis not present

## 2019-02-28 DIAGNOSIS — E222 Syndrome of inappropriate secretion of antidiuretic hormone: Secondary | ICD-10-CM | POA: Diagnosis not present

## 2019-02-28 DIAGNOSIS — Z932 Ileostomy status: Secondary | ICD-10-CM | POA: Diagnosis not present

## 2019-02-28 DIAGNOSIS — Z0000 Encounter for general adult medical examination without abnormal findings: Secondary | ICD-10-CM | POA: Diagnosis not present

## 2019-02-28 DIAGNOSIS — Z1389 Encounter for screening for other disorder: Secondary | ICD-10-CM | POA: Diagnosis not present

## 2019-02-28 DIAGNOSIS — I10 Essential (primary) hypertension: Secondary | ICD-10-CM | POA: Diagnosis not present

## 2019-02-28 DIAGNOSIS — Z79899 Other long term (current) drug therapy: Secondary | ICD-10-CM | POA: Diagnosis not present

## 2019-04-10 ENCOUNTER — Other Ambulatory Visit: Payer: Self-pay

## 2019-04-10 ENCOUNTER — Ambulatory Visit
Admission: RE | Admit: 2019-04-10 | Discharge: 2019-04-10 | Disposition: A | Discharge status: Home / Self Care | Payer: Medicare Other | Source: Ambulatory Visit | Attending: Geriatric Medicine | Admitting: Geriatric Medicine

## 2019-04-10 DIAGNOSIS — M85852 Other specified disorders of bone density and structure, left thigh: Secondary | ICD-10-CM | POA: Diagnosis not present

## 2019-04-10 DIAGNOSIS — M810 Age-related osteoporosis without current pathological fracture: Secondary | ICD-10-CM

## 2019-04-18 DIAGNOSIS — L820 Inflamed seborrheic keratosis: Secondary | ICD-10-CM | POA: Diagnosis not present

## 2019-04-18 DIAGNOSIS — Z1283 Encounter for screening for malignant neoplasm of skin: Secondary | ICD-10-CM | POA: Diagnosis not present

## 2019-04-18 DIAGNOSIS — L570 Actinic keratosis: Secondary | ICD-10-CM | POA: Diagnosis not present

## 2019-04-18 DIAGNOSIS — X32XXXD Exposure to sunlight, subsequent encounter: Secondary | ICD-10-CM | POA: Diagnosis not present

## 2019-04-18 DIAGNOSIS — D225 Melanocytic nevi of trunk: Secondary | ICD-10-CM | POA: Diagnosis not present

## 2019-05-01 DIAGNOSIS — M810 Age-related osteoporosis without current pathological fracture: Secondary | ICD-10-CM | POA: Diagnosis not present

## 2019-05-02 ENCOUNTER — Ambulatory Visit: Payer: Medicare Other

## 2019-05-04 ENCOUNTER — Ambulatory Visit
Admission: RE | Admit: 2019-05-04 | Discharge: 2019-05-04 | Disposition: A | Discharge status: Home / Self Care | Payer: Medicare Other | Source: Ambulatory Visit | Attending: Geriatric Medicine | Admitting: Geriatric Medicine

## 2019-05-04 ENCOUNTER — Other Ambulatory Visit: Payer: Self-pay

## 2019-05-04 DIAGNOSIS — Z1231 Encounter for screening mammogram for malignant neoplasm of breast: Secondary | ICD-10-CM

## 2019-05-17 DIAGNOSIS — M810 Age-related osteoporosis without current pathological fracture: Secondary | ICD-10-CM | POA: Diagnosis not present

## 2019-05-17 DIAGNOSIS — E042 Nontoxic multinodular goiter: Secondary | ICD-10-CM | POA: Diagnosis not present

## 2019-05-17 DIAGNOSIS — K219 Gastro-esophageal reflux disease without esophagitis: Secondary | ICD-10-CM | POA: Diagnosis not present

## 2019-05-17 DIAGNOSIS — Z808 Family history of malignant neoplasm of other organs or systems: Secondary | ICD-10-CM | POA: Diagnosis not present

## 2019-05-17 DIAGNOSIS — Z8349 Family history of other endocrine, nutritional and metabolic diseases: Secondary | ICD-10-CM | POA: Diagnosis not present

## 2019-05-28 ENCOUNTER — Other Ambulatory Visit (HOSPITAL_COMMUNITY): Payer: Self-pay

## 2019-05-29 ENCOUNTER — Other Ambulatory Visit: Payer: Self-pay

## 2019-05-29 ENCOUNTER — Ambulatory Visit (HOSPITAL_COMMUNITY)
Admission: RE | Admit: 2019-05-29 | Discharge: 2019-05-29 | Disposition: A | Discharge status: Home / Self Care | Payer: Medicare Other | Source: Ambulatory Visit | Attending: Internal Medicine | Admitting: Internal Medicine

## 2019-05-29 DIAGNOSIS — M810 Age-related osteoporosis without current pathological fracture: Secondary | ICD-10-CM | POA: Diagnosis not present

## 2019-05-29 MED ORDER — ZOLEDRONIC ACID 5 MG/100ML IV SOLN: 5.0000 mg | Freq: Once | INTRAVENOUS | Status: AC

## 2019-05-29 MED ORDER — ZOLEDRONIC ACID 5 MG/100ML IV SOLN
INTRAVENOUS | Status: AC
  Administered 2019-05-29: 5 mg via INTRAVENOUS
  Filled 2019-05-29: qty 100

## 2019-05-30 DIAGNOSIS — L570 Actinic keratosis: Secondary | ICD-10-CM | POA: Diagnosis not present

## 2019-05-30 DIAGNOSIS — X32XXXD Exposure to sunlight, subsequent encounter: Secondary | ICD-10-CM | POA: Diagnosis not present

## 2019-06-14 DIAGNOSIS — M810 Age-related osteoporosis without current pathological fracture: Secondary | ICD-10-CM | POA: Diagnosis not present

## 2019-08-15 DIAGNOSIS — J301 Allergic rhinitis due to pollen: Secondary | ICD-10-CM | POA: Diagnosis not present

## 2019-08-15 DIAGNOSIS — M7061 Trochanteric bursitis, right hip: Secondary | ICD-10-CM | POA: Diagnosis not present

## 2019-08-15 DIAGNOSIS — I10 Essential (primary) hypertension: Secondary | ICD-10-CM | POA: Diagnosis not present

## 2019-08-21 ENCOUNTER — Ambulatory Visit (INDEPENDENT_AMBULATORY_CARE_PROVIDER_SITE_OTHER): Payer: Medicare Other | Admitting: Orthopaedic Surgery

## 2019-08-21 ENCOUNTER — Ambulatory Visit (INDEPENDENT_AMBULATORY_CARE_PROVIDER_SITE_OTHER): Payer: Medicare Other

## 2019-08-21 VITALS — Ht 60.0 in | Wt 145.0 lb

## 2019-08-21 DIAGNOSIS — M25551 Pain in right hip: Secondary | ICD-10-CM

## 2019-08-21 DIAGNOSIS — M7061 Trochanteric bursitis, right hip: Secondary | ICD-10-CM

## 2019-08-21 MED ORDER — METHYLPREDNISOLONE ACETATE 40 MG/ML IJ SUSP
40.0000 mg | INTRAMUSCULAR | Status: AC | PRN
  Administered 2019-08-21: 40 mg via INTRA_ARTICULAR

## 2019-08-21 MED ORDER — LIDOCAINE HCL 1 % IJ SOLN
3.0000 mL | INTRAMUSCULAR | Status: AC | PRN
Start: 2019-08-21 — End: 2019-08-21
  Administered 2019-08-21: 3 mL

## 2019-08-21 NOTE — Progress Notes (Signed)
Office Visit Note   Patient: Sandy Trujillo           Date of Birth: December 12, 1932           MRN: [id number] Visit Date: 08/21/2019              Requested by: Lajean Manes, MD [address] [place],  [place] [address] PCP: Lajean Manes, MD   Assessment & Plan: Visit Diagnoses:  1. Pain in right hip   2. Trochanteric bursitis, right hip     Plan: I do feel that there is a component of trochanteric bursitis to the right hip.  There is a small component of arthritis but most of her pain is to palpation over the lateral aspect.  I recommended a steroid injection in this area and she agreed with this.  I also recommended Voltaren gel.  She tolerated this well.  I showed her some stretching exercises to try.  All questions and concerns were answered and addressed.  Follow-up can be as needed.  If things worsen they will let us know.  Follow-Up Instructions: Return in about 4 weeks (around 09/18/2019), or if symptoms worsen or fail to improve.   Orders:  Orders Placed This Encounter  Procedures  . Large Joint Inj  . XR HIP UNILAT W OR W/O PELVIS 1V RIGHT   No orders of the defined types were placed in this encounter.     Procedures: Large Joint Inj: R greater trochanter on 08/21/2019 12:22 PM Indications: pain and diagnostic evaluation Details: 22 G 1.5 in needle, lateral approach  Arthrogram: No  Medications: 3 mL lidocaine 1 %; 40 mg methylPREDNISolone acetate 40 MG/ML Outcome: tolerated well, no immediate complications Procedure, treatment alternatives, risks and benefits explained, specific risks discussed. Consent was given by the patient. Immediately prior to procedure a time out was called to verify the correct patient, procedure, equipment, support staff and site/side marked as required. Patient was prepped and draped in the usual sterile fashion.       Clinical Data: No additional findings.   Subjective: Chief Complaint  Patient presents with    . Right Hip - Pain  The patient is someone seen before.  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  Since she has been walking differently she has now developed pain of her right hip.  She denies any groin pain but reports pain on the right side of her hip when she lays on that side and points the trochanteric area.  She said no other acute change in her medical status.  It has been hurting for some time now.  HPI  Review of Systems She currently denies any headache, chest pain, shortness of breath, fever, chills, nausea, vomiting  Objective: Vital Signs: Ht 5' (1.524 m)   Wt 145 lb (65.8 kg)   BMI 28.32 kg/m   Physical Exam She is alert and orient x3 and in no acute distress.  She does mobilize very slowly. Ortho Exam On examination of her right hip I can put her through internal and external rotation with just a small amount of pain in the groin with the extremes of rotation.  When I have her lay supine and then rolled in a decubitus position she has a lot of pain to palpation of the trochanteric area of her right hip and the proximal IT  band and that is mainly the source of her pain. Specialty Comments:  No specialty comments available.  Imaging: XR HIP UNILAT W OR W/O PELVIS 1V RIGHT  Result Date: 08/21/2019 An AP pelvis lateral right hip shows no acute findings.  There is mild arthritic changes with an inferior periarticular osteophyte and slight joint space narrowing.    PMFS History: Patient Active Problem List   Diagnosis Date Noted  . HYPERLIPIDEMIA 04/18/2007  . ALLERGIC RHINITIS 04/18/2007  . PULMONARY NODULE 04/18/2007  . CARCINOMA, RECTUM 04/17/2007  . ANXIETY 04/17/2007  . HYPERTENSION 04/17/2007  . COLITIS, ULCERATIVE 04/17/2007   Past Medical History:  Diagnosis Date  . Hyperparathyroidism ([place]) 2019  . Ileostomy in place  [facility])   . Osteoporosis 2019  . rectal ca 2007   surg only    No family history on file.  Past Surgical History:  Procedure Laterality Date  . ABDOMINAL HYSTERECTOMY    . BREAST EXCISIONAL BIOPSY Bilateral    benign  . COLON SURGERY  2007   Social History   Occupational History  . Not on file  Tobacco Use  . Smoking status: Never Smoker  . Smokeless tobacco: Never Used  Vaping Use  . Vaping Use: Never used  Substance and Sexual Activity  . Alcohol use: Yes  . Drug use: Never  . Sexual activity: Not on file

## 2019-09-26 ENCOUNTER — Ambulatory Visit: Payer: Medicare Other | Admitting: Orthopaedic Surgery

## 2019-10-04 DIAGNOSIS — R350 Frequency of micturition: Secondary | ICD-10-CM | POA: Diagnosis not present

## 2019-10-10 ENCOUNTER — Encounter: Payer: Self-pay | Admitting: Orthopaedic Surgery

## 2019-10-10 ENCOUNTER — Ambulatory Visit (INDEPENDENT_AMBULATORY_CARE_PROVIDER_SITE_OTHER): Payer: Medicare Other | Admitting: Orthopaedic Surgery

## 2019-10-10 ENCOUNTER — Other Ambulatory Visit: Payer: Self-pay

## 2019-10-10 DIAGNOSIS — M7061 Trochanteric bursitis, right hip: Secondary | ICD-10-CM | POA: Diagnosis not present

## 2019-10-10 DIAGNOSIS — M25551 Pain in right hip: Secondary | ICD-10-CM

## 2019-10-10 NOTE — Progress Notes (Signed)
The patient is well-known to me.  About 7 weeks ago I placed a steroid injection around her right hip trochanteric area for trochanteric bursitis.  She says that did help but she is still having some pain.  She is 86 years old and has been seeing a Physiological scientist.  She feels like that may have gotten things worse for her.  She is not a diabetic.  She still does hurt around that hip but it feels a little bit better.  She did not get any Voltaren gel because she was concerned about the potential side effects.  She is not on blood thinning medication and does not have an issue with anti-inflammatories.  On exam she walks without an assistive device.  Her right hip moves smoothly and fluidly but she does have pain over the trochanteric area and some in the sciatic area and the proximal IT band.  This point I would like to send her to outpatient physical therapy at [facility] outpatient therapy in [place].  She lives near there.  We will have them work on any modalities that can help decrease her pain and improve the function around her right hip.  This will be per therapist discretion.  I would then like to see her back in 6 weeks to consider a repeat steroid injection.  She will pick up some Voltaren gel.  I told her that would be safe for her.  We will reevaluate in 6 weeks.  All question concerns were answered and addressed.

## 2019-10-15 DIAGNOSIS — X32XXXD Exposure to sunlight, subsequent encounter: Secondary | ICD-10-CM | POA: Diagnosis not present

## 2019-10-15 DIAGNOSIS — L570 Actinic keratosis: Secondary | ICD-10-CM | POA: Diagnosis not present

## 2019-10-15 DIAGNOSIS — D225 Melanocytic nevi of trunk: Secondary | ICD-10-CM | POA: Diagnosis not present

## 2019-10-15 DIAGNOSIS — L218 Other seborrheic dermatitis: Secondary | ICD-10-CM | POA: Diagnosis not present

## 2019-10-15 DIAGNOSIS — L820 Inflamed seborrheic keratosis: Secondary | ICD-10-CM | POA: Diagnosis not present

## 2019-10-17 DIAGNOSIS — Z23 Encounter for immunization: Secondary | ICD-10-CM | POA: Diagnosis not present

## 2019-10-22 DIAGNOSIS — D3617 Benign neoplasm of peripheral nerves and autonomic nervous system of trunk, unspecified: Secondary | ICD-10-CM | POA: Diagnosis not present

## 2019-10-30 DIAGNOSIS — R319 Hematuria, unspecified: Secondary | ICD-10-CM | POA: Diagnosis not present

## 2019-10-30 DIAGNOSIS — N390 Urinary tract infection, site not specified: Secondary | ICD-10-CM | POA: Diagnosis not present

## 2019-11-21 ENCOUNTER — Ambulatory Visit: Payer: Medicare Other | Admitting: Orthopaedic Surgery

## 2019-11-27 ENCOUNTER — Ambulatory Visit: Payer: Medicare Other | Admitting: Physical Therapy

## 2019-12-04 ENCOUNTER — Ambulatory Visit: Payer: Medicare Other | Admitting: Physical Therapy

## 2019-12-12 ENCOUNTER — Encounter: Payer: Self-pay | Admitting: Orthopaedic Surgery

## 2019-12-12 ENCOUNTER — Ambulatory Visit (INDEPENDENT_AMBULATORY_CARE_PROVIDER_SITE_OTHER): Payer: Medicare Other | Admitting: Orthopaedic Surgery

## 2019-12-12 DIAGNOSIS — M25551 Pain in right hip: Secondary | ICD-10-CM | POA: Diagnosis not present

## 2019-12-12 DIAGNOSIS — M7061 Trochanteric bursitis, right hip: Secondary | ICD-10-CM | POA: Diagnosis not present

## 2019-12-12 NOTE — Progress Notes (Signed)
The patient is here for follow-up dealing with trochanteric bursitis of both her hips with the right was worse than left.  I injected her earlier this year in July on that hip and she said it is done very well.  She has some low back pain when she is tired and some lower leg pain when she is tired but she does state that a personal trainer is helped get her stronger and overall she is doing better.  Examination of both hips show the move smoothly and fluidly.  She has just some mild pain over the right hip trochanteric area and IT band.  Her knee exam on the right side is normal as well.  At this point since she is doing well follow-up can be as needed.  If things worsen in any way or she needs to be seen she will let us know.  I told her to be careful with her gait and that if she gets to the point where she is feeling weak but I would want her to consider ambulating with a cane

## 2019-12-28 DIAGNOSIS — E222 Syndrome of inappropriate secretion of antidiuretic hormone: Secondary | ICD-10-CM | POA: Diagnosis not present

## 2019-12-28 DIAGNOSIS — E213 Hyperparathyroidism, unspecified: Secondary | ICD-10-CM | POA: Diagnosis not present

## 2019-12-28 DIAGNOSIS — Z932 Ileostomy status: Secondary | ICD-10-CM | POA: Diagnosis not present

## 2019-12-28 DIAGNOSIS — I10 Essential (primary) hypertension: Secondary | ICD-10-CM | POA: Diagnosis not present

## 2020-01-09 DIAGNOSIS — K219 Gastro-esophageal reflux disease without esophagitis: Secondary | ICD-10-CM | POA: Diagnosis not present

## 2020-01-09 DIAGNOSIS — Z808 Family history of malignant neoplasm of other organs or systems: Secondary | ICD-10-CM | POA: Diagnosis not present

## 2020-01-09 DIAGNOSIS — Z8349 Family history of other endocrine, nutritional and metabolic diseases: Secondary | ICD-10-CM | POA: Diagnosis not present

## 2020-01-09 DIAGNOSIS — M810 Age-related osteoporosis without current pathological fracture: Secondary | ICD-10-CM | POA: Diagnosis not present

## 2020-01-09 DIAGNOSIS — E042 Nontoxic multinodular goiter: Secondary | ICD-10-CM | POA: Diagnosis not present

## 2020-01-16 DIAGNOSIS — X32XXXD Exposure to sunlight, subsequent encounter: Secondary | ICD-10-CM | POA: Diagnosis not present

## 2020-01-16 DIAGNOSIS — L570 Actinic keratosis: Secondary | ICD-10-CM | POA: Diagnosis not present

## 2020-01-16 DIAGNOSIS — L821 Other seborrheic keratosis: Secondary | ICD-10-CM | POA: Diagnosis not present

## 2020-01-16 DIAGNOSIS — D0439 Carcinoma in situ of skin of other parts of face: Secondary | ICD-10-CM | POA: Diagnosis not present

## 2020-01-16 DIAGNOSIS — L820 Inflamed seborrheic keratosis: Secondary | ICD-10-CM | POA: Diagnosis not present

## 2020-02-04 DIAGNOSIS — Z08 Encounter for follow-up examination after completed treatment for malignant neoplasm: Secondary | ICD-10-CM | POA: Diagnosis not present

## 2020-02-04 DIAGNOSIS — Z85828 Personal history of other malignant neoplasm of skin: Secondary | ICD-10-CM | POA: Diagnosis not present

## 2020-02-20 DIAGNOSIS — E222 Syndrome of inappropriate secretion of antidiuretic hormone: Secondary | ICD-10-CM | POA: Diagnosis not present

## 2020-02-20 DIAGNOSIS — I10 Essential (primary) hypertension: Secondary | ICD-10-CM | POA: Diagnosis not present

## 2020-03-18 DIAGNOSIS — E222 Syndrome of inappropriate secretion of antidiuretic hormone: Secondary | ICD-10-CM | POA: Diagnosis not present

## 2020-03-18 DIAGNOSIS — Z79899 Other long term (current) drug therapy: Secondary | ICD-10-CM | POA: Diagnosis not present

## 2020-03-18 DIAGNOSIS — D649 Anemia, unspecified: Secondary | ICD-10-CM | POA: Diagnosis not present

## 2020-03-18 DIAGNOSIS — Z23 Encounter for immunization: Secondary | ICD-10-CM | POA: Diagnosis not present

## 2020-03-18 DIAGNOSIS — I10 Essential (primary) hypertension: Secondary | ICD-10-CM | POA: Diagnosis not present

## 2020-03-18 DIAGNOSIS — Z1389 Encounter for screening for other disorder: Secondary | ICD-10-CM | POA: Diagnosis not present

## 2020-03-18 DIAGNOSIS — Z0000 Encounter for general adult medical examination without abnormal findings: Secondary | ICD-10-CM | POA: Diagnosis not present

## 2020-03-26 ENCOUNTER — Other Ambulatory Visit: Payer: Self-pay | Admitting: Geriatric Medicine

## 2020-03-26 DIAGNOSIS — Z1231 Encounter for screening mammogram for malignant neoplasm of breast: Secondary | ICD-10-CM

## 2020-04-18 DIAGNOSIS — H04123 Dry eye syndrome of bilateral lacrimal glands: Secondary | ICD-10-CM | POA: Diagnosis not present

## 2020-04-18 DIAGNOSIS — Z961 Presence of intraocular lens: Secondary | ICD-10-CM | POA: Diagnosis not present

## 2020-05-07 DIAGNOSIS — D508 Other iron deficiency anemias: Secondary | ICD-10-CM | POA: Diagnosis not present

## 2020-05-07 DIAGNOSIS — E042 Nontoxic multinodular goiter: Secondary | ICD-10-CM | POA: Diagnosis not present

## 2020-05-07 DIAGNOSIS — M810 Age-related osteoporosis without current pathological fracture: Secondary | ICD-10-CM | POA: Diagnosis not present

## 2020-05-19 ENCOUNTER — Ambulatory Visit: Payer: Medicare Other

## 2020-05-20 ENCOUNTER — Ambulatory Visit (INDEPENDENT_AMBULATORY_CARE_PROVIDER_SITE_OTHER): Payer: Medicare Other | Admitting: Orthopaedic Surgery

## 2020-05-20 ENCOUNTER — Other Ambulatory Visit: Payer: Self-pay

## 2020-05-20 ENCOUNTER — Encounter: Payer: Self-pay | Admitting: Orthopaedic Surgery

## 2020-05-20 DIAGNOSIS — S76312D Strain of muscle, fascia and tendon of the posterior muscle group at thigh level, left thigh, subsequent encounter: Secondary | ICD-10-CM | POA: Diagnosis not present

## 2020-05-20 DIAGNOSIS — S76311D Strain of muscle, fascia and tendon of the posterior muscle group at thigh level, right thigh, subsequent encounter: Secondary | ICD-10-CM | POA: Diagnosis not present

## 2020-05-20 MED ORDER — METHYLPREDNISOLONE 4 MG PO TABS: ORAL_TABLET | ORAL | 0 refills | Status: DC

## 2020-05-20 NOTE — Progress Notes (Signed)
Office Visit Note   Patient: Sandy Trujillo           Date of Birth: 10-06-1932           MRN: [id number] Visit Date: 05/20/2020              Requested by: Lajean Manes, MD [address] [place],  [place] [address] PCP: Lajean Manes, MD   Assessment & Plan: Visit Diagnoses:  1. Strain of left hamstring muscle, subsequent encounter   2. Strain of right hamstring muscle, subsequent encounter     Plan: I would like her to stop her lower extremity working out with a trainer and just do upper body workout.  Instead, we will like to send her to outpatient physical therapy so they can try any modalities to help decrease the pain she is experiencing around the hamstring and sciatic area bilaterally.  They can also work on balance and coordination.  Any modalities per therapist discretion.  They can can get an idea as well about what is better FOR her for her lower extremities.  I have also recommended Voltaren gel.  We will try a steroid taper just calm down some of the acute pain.  All questions and concerns were answered and addressed.  We will order outpatient physical therapy here and then I will see her back in 6 weeks to see how she is doing overall.  Follow-Up Instructions: Return in about 6 weeks (around 07/01/2020).   Orders:  No orders of the defined types were placed in this encounter.  Meds ordered this encounter  Medications  . methylPREDNISolone (MEDROL) 4 MG tablet    Sig: Medrol dose pack. Take as instructed    Dispense:  21 tablet    Refill:  0      Procedures: No procedures performed   Clinical Data: No additional findings.   Subjective: Chief Complaint  Patient presents with  . Left Leg - Pain  . Right Leg - Pain  The patient is a very active 87 year old female who comes in today for evaluation and treatment of bilateral leg pain and she points to the hamstring area on both sides as a source of her pain.  Is been worse on the left than the  right.  She works with a Physiological scientist and said when she started doing leg presses when things have become more painful to her.  She does ambulate with a cane/walking stick.  She is taking Tylenol for pain.  She says this in the back of her legs hurt.  She wonders if there is a portion of this that is sciatica.  Family is with her today as well.  She is now diabetic.  The first time that she did do the leg press a day for 5 days to feel better.  She denies any numbness and tingling in her feet.  She states she would like to have better balance and coordination.  HPI  Review of Systems There is currently listed no headache, chest pain, shortness of breath, fever, chills, nausea, vomiting  Objective: Vital Signs: There were no vitals taken for this visit.  Physical Exam She is alert and orient x3 and in no acute distress Ortho Exam Examination of her bilateral lower extremities shows excellent strength in all muscle groups and normal sensation.  She has negative straight leg raise bilaterally.  There is tenderness to palpation along the hamstrings bilaterally as well as at their insertion sites. Specialty Comments:  No specialty comments available.  Imaging: No results found.   PMFS History: Patient Active Problem List   Diagnosis Date Noted  . HYPERLIPIDEMIA 04/18/2007  . ALLERGIC RHINITIS 04/18/2007  . PULMONARY NODULE 04/18/2007  . CARCINOMA, RECTUM 04/17/2007  . ANXIETY 04/17/2007  . HYPERTENSION 04/17/2007  . COLITIS, ULCERATIVE 04/17/2007   Past Medical History:  Diagnosis Date  . Hyperparathyroidism ([place]) 2019  . Ileostomy in place [facility])   . Osteoporosis 2019  . rectal ca 2007   surg only    History reviewed. No pertinent family history.  Past Surgical History:  Procedure Laterality Date  . ABDOMINAL HYSTERECTOMY    . BREAST EXCISIONAL BIOPSY Bilateral    benign  . COLON SURGERY  2007   Social History   Occupational History  . Not on file  Tobacco Use  .  Smoking status: Never Smoker  . Smokeless tobacco: Never Used  Vaping Use  . Vaping Use: Never used  Substance and Sexual Activity  . Alcohol use: Yes  . Drug use: Never  . Sexual activity: Not on file

## 2020-05-29 ENCOUNTER — Other Ambulatory Visit: Payer: Self-pay

## 2020-05-29 ENCOUNTER — Encounter: Payer: Self-pay | Admitting: Rehabilitative and Restorative Service Providers"

## 2020-05-29 ENCOUNTER — Ambulatory Visit (INDEPENDENT_AMBULATORY_CARE_PROVIDER_SITE_OTHER): Payer: Medicare Other | Admitting: Rehabilitative and Restorative Service Providers"

## 2020-05-29 DIAGNOSIS — R293 Abnormal posture: Secondary | ICD-10-CM

## 2020-05-29 DIAGNOSIS — M6281 Muscle weakness (generalized): Secondary | ICD-10-CM

## 2020-05-29 DIAGNOSIS — M79651 Pain in right thigh: Secondary | ICD-10-CM | POA: Diagnosis not present

## 2020-05-29 DIAGNOSIS — R262 Difficulty in walking, not elsewhere classified: Secondary | ICD-10-CM | POA: Diagnosis not present

## 2020-05-29 DIAGNOSIS — M79652 Pain in left thigh: Secondary | ICD-10-CM

## 2020-05-29 NOTE — Therapy (Signed)
[facility] Physical Therapy [address] [place], [place], [address] Phone: [phone]   Fax:  [phone]  Physical Therapy Evaluation  Patient Details  Name: Sandy Trujillo MRN: [id number] Date of Birth: 1932/11/28 Referring Provider (PT): Mcarthur Rossetti MD   Encounter Date: 05/29/2020   PT End of Session - 05/29/20 1014    Visit Number 1    Number of Visits 8    PT Start Time 0915    PT Stop Time 7510    PT Time Calculation (min) 47 min    Activity Tolerance Patient tolerated treatment well;No increased pain    Behavior During Therapy WFL for tasks assessed/performed           Past Medical History:  Diagnosis Date  . Hyperparathyroidism ([place]) 2019  . Ileostomy in place [facility])   . Osteoporosis 2019  . rectal ca 2007   surg only    Past Surgical History:  Procedure Laterality Date  . ABDOMINAL HYSTERECTOMY    . BREAST EXCISIONAL BIOPSY Bilateral    benign  . COLON SURGERY  2007    There were no vitals filed for this visit.    Subjective Assessment - 05/29/20 1008    Subjective Sandy Trujillo reports she feels like she overdid a leg press workout with her personal trainer resulting in B hamstrings pain.  Symptoms have been getting better and she has a history of a previous lumbar decompression surgery.  Her R leg is weaker than her L and she would like to work on balance.    Pertinent History HTN, previous lumbar decompression L4-5 in August 2020, HTN    Limitations House hold activities;Lifting;Walking    How long can you walk comfortably? Hamstrings discomfort with walking    Patient Stated Goals Return to working out at the gym and be able to walk normally without hamstrings discomfort.    Currently in Pain? Yes    Pain Score 4     Pain Location --   B hamstrings   Pain Orientation Left;Right    Pain Descriptors / Indicators Aching;Discomfort;Sore    Pain Type Acute pain    Pain Radiating Towards NA    Pain Onset 1 to 4 weeks ago     Pain Frequency Constant    Aggravating Factors  Walking    Pain Relieving Factors Pain meds    Effect of Pain on Daily Activities Has to move carefully and discomfort with walking    Multiple Pain Sites No              [facility] PT Assessment - 05/29/20 0001      Assessment   Medical Diagnosis B hamstrings strain    Referring Provider (PT) Mcarthur Rossetti MD    Onset Date/Surgical Date --   ~ 3 weeks     Balance Screen   Has the patient fallen in the past 6 months No    Has the patient had a decrease in activity level because of a fear of falling?  No    Is the patient reluctant to leave their home because of a fear of falling?  No      Home Social worker Private residence    Living Arrangements Alone      Prior Function   Level of Independence Independent      Cognition   Overall Cognitive Status Within Functional Limits for tasks assessed      Observation/Other Assessments   Focus on Therapeutic Outcomes (  FOTO)  47 (Goal 65)      Posture/Postural Control   Posture/Postural Control Postural limitations    Postural Limitations Forward head;Rounded Shoulders;Decreased lumbar lordosis      ROM / Strength   AROM / PROM / Strength AROM      AROM   Overall AROM  Deficits    AROM Assessment Site Hip;Lumbar    Right/Left Hip Left;Right    Right Hip Extension --    Right Hip Flexion 95    Right Hip External Rotation  30    Right Hip Internal Rotation  5    Left Hip Flexion 95    Left Hip External Rotation  42    Left Hip Internal Rotation  6    Lumbar Extension 10      Flexibility   Soft Tissue Assessment /Muscle Length yes    Hamstrings 40 degrees L and R                      Objective measurements completed on examination: See above findings.       [place] Adult PT Treatment/Exercise - 05/29/20 0001      Therapeutic Activites    Therapeutic Activities Other Therapeutic Activities    Other Therapeutic Activities Review  exam findings, postural education and review starter HEP                  PT Education - 05/29/20 1014    Education Details Reviewed exam findings, postural education and reviewed HEP.    Person(s) Educated Patient    Methods Explanation;Demonstration;Verbal cues;Handout    Comprehension Verbal cues required;Need further instruction;Returned demonstration;Verbalized understanding            PT Short Term Goals - 05/29/20 1024      PT SHORT TERM GOAL #1   Title Sandy Trujillo will be independent with her day 1 HEP.    Time 2    Period Weeks    Status New    Target Date 06/12/20             PT Long Term Goals - 05/29/20 1025      PT LONG TERM GOAL #1   Title Improve FOTO to 65.    Baseline 47    Time 4    Period Weeks    Status New    Target Date 06/26/20      PT LONG TERM GOAL #2   Title Sandy Trujillo will report B posterior thigh pain consistently <3/10 on the Numeric Pain Rating Scale.    Baseline Can be 5/10    Time 4    Period Weeks    Status New    Target Date 06/26/20      PT LONG TERM GOAL #3   Title Sandy Trujillo will be able to walk 1/4 mile or more without apprehension and without an assistive device.    Baseline Uses trekking pole.    Time 4    Period Weeks    Status New    Target Date 06/26/20      PT LONG TERM GOAL #4   Title Sandy Trujillo will be independent with her long-term HEP.    Time 4    Period Weeks    Status New    Target Date 06/26/20                  Plan - 05/29/20 1005    Clinical Impression Statement Sandy Trujillo appears to be getting better from overdoing  a leg press workout with a Physiological scientist.  With her previous spine surgery, we will also address some postural weaknesses along with leg strength.  Sandy Trujillo also asked to address balance as she would like to be more comfortable walking without an assistive device (using a trekking pole currently).  Her prognosis is good to meet the below listed goals.    Personal Factors and Comorbidities  Age;Comorbidity 3+    Comorbidities Previous L4-5 decompression, R leg weakness, HTN    Examination-Activity Limitations Bed Mobility;Bend;Lift;Squat;Stairs;Locomotion Level    Examination-Participation Restrictions Community Activity    Stability/Clinical Decision Making Stable/Uncomplicated    Clinical Decision Making Low    Rehab Potential Good    PT Frequency 2x / week    PT Duration 4 weeks    PT Treatment/Interventions ADLs/Self Care Home Management;Cryotherapy;Therapeutic activities;Stair training;Gait training;Therapeutic exercise;Balance training;Neuromuscular re-education;Patient/family education;Manual techniques    PT Next Visit Plan Progress LE strengthening (gradually), postural strength and balance    PT Home Exercise Plan Access Code: [email or web address]    Consulted and Agree with Plan of Care Patient           Patient will benefit from skilled therapeutic intervention in order to improve the following deficits and impairments:  Abnormal gait,Decreased activity tolerance,Decreased balance,Decreased endurance,Decreased strength,Difficulty walking,Increased edema,Impaired flexibility,Postural dysfunction,Improper body mechanics,Pain  Visit Diagnosis: Difficulty walking  Muscle weakness (generalized)  Abnormal posture  Pain in left thigh  Pain in right thigh     Problem List Patient Active Problem List   Diagnosis Date Noted  . HYPERLIPIDEMIA 04/18/2007  . ALLERGIC RHINITIS 04/18/2007  . PULMONARY NODULE 04/18/2007  . CARCINOMA, RECTUM 04/17/2007  . ANXIETY 04/17/2007  . HYPERTENSION 04/17/2007  . COLITIS, ULCERATIVE 04/17/2007    Farley Ly PT, MPT 05/29/2020, 10:29 AM  [facility] Physical Therapy [address] [place], [place], [address] Phone: [phone]   Fax:  [phone]  Name: JULL HARRAL MRN: [id number] Date of Birth: [birth year]

## 2020-05-29 NOTE — Patient Instructions (Signed)
Access Code: [email or web address] URL: [email or web address] Date: 05/29/2020 Prepared by: Vista Mink  Exercises Standing Lumbar Extension at [place] 5 x daily - 7 x weekly - 1 sets - 5 reps - 3 seconds hold Standing Scapular Retraction - 5 x daily - 7 x weekly - 1 sets - 5 reps - 5 second hold Sit to Stand with Armchair - 2 x daily - 7 x weekly - 1 sets - 5 reps Heel Toe Raises with Counter Support - 2 x daily - 7 x weekly - 1 sets - 10 reps - 3 seconds hold

## 2020-06-02 ENCOUNTER — Ambulatory Visit (HOSPITAL_COMMUNITY): Payer: Medicare Other

## 2020-06-04 ENCOUNTER — Encounter: Payer: Medicare Other | Admitting: Physical Therapy

## 2020-06-11 ENCOUNTER — Encounter: Payer: Self-pay | Admitting: Physical Therapy

## 2020-06-11 ENCOUNTER — Other Ambulatory Visit: Payer: Self-pay

## 2020-06-11 ENCOUNTER — Ambulatory Visit (INDEPENDENT_AMBULATORY_CARE_PROVIDER_SITE_OTHER): Payer: Medicare Other | Admitting: Physical Therapy

## 2020-06-11 DIAGNOSIS — R2689 Other abnormalities of gait and mobility: Secondary | ICD-10-CM | POA: Diagnosis not present

## 2020-06-11 DIAGNOSIS — R262 Difficulty in walking, not elsewhere classified: Secondary | ICD-10-CM | POA: Diagnosis not present

## 2020-06-11 DIAGNOSIS — M79651 Pain in right thigh: Secondary | ICD-10-CM | POA: Diagnosis not present

## 2020-06-11 DIAGNOSIS — M79652 Pain in left thigh: Secondary | ICD-10-CM

## 2020-06-11 DIAGNOSIS — M545 Low back pain, unspecified: Secondary | ICD-10-CM | POA: Diagnosis not present

## 2020-06-11 DIAGNOSIS — R293 Abnormal posture: Secondary | ICD-10-CM

## 2020-06-11 DIAGNOSIS — G8929 Other chronic pain: Secondary | ICD-10-CM

## 2020-06-11 DIAGNOSIS — M6281 Muscle weakness (generalized): Secondary | ICD-10-CM | POA: Diagnosis not present

## 2020-06-11 NOTE — Therapy (Addendum)
[facility] Physical Therapy [address] [place], [place], [address] Phone: [phone]   Fax:  [phone]  Physical Therapy Treatment  Patient Details  Name: Sandy Trujillo MRN: [id number] Date of Birth: Oct 21, 1932 Referring Provider (PT): Mcarthur Rossetti MD   Encounter Date: 06/11/2020   PT End of Session - 06/11/20 1026    Visit Number 2    Number of Visits 8    PT Start Time 4196    PT Stop Time 2229    PT Time Calculation (min) 38 min    Activity Tolerance Patient tolerated treatment well;No increased pain    Behavior During Therapy WFL for tasks assessed/performed           Past Medical History:  Diagnosis Date  . Hyperparathyroidism ([place]) 2019  . Ileostomy in place [facility])   . Osteoporosis 2019  . rectal ca 2007   surg only    Past Surgical History:  Procedure Laterality Date  . ABDOMINAL HYSTERECTOMY    . BREAST EXCISIONAL BIOPSY Bilateral    benign  . COLON SURGERY  2007    There were no vitals filed for this visit.   Subjective Assessment - 06/11/20 1025    Subjective Pt arriving stating she developed Covid following her last PT visit and she hasn't been doing her HEP due to fatigue and being sick.    Pertinent History HTN, previous lumbar decompression L4-5 in August 2020, HTN    Limitations House hold activities;Lifting;Walking    How long can you walk comfortably? Hamstrings discomfort with walking    Patient Stated Goals Return to working out at the gym and be able to walk normally without hamstrings discomfort.    Currently in Pain? No/denies                             [facility] Adult PT Treatment/Exercise - 06/11/20 0001      Neuro Re-ed    Neuro Re-ed Details  tandum stance, Airex: feet apart, feet together      Exercises   Exercises Knee/Hip      Knee/Hip Exercises: Stretches   Active Hamstring Stretch Both;3 reps;30 seconds    Active Hamstring Stretch Limitations using green strap     Gastroc Stretch 2 reps;20 seconds    Gastroc Stretch Limitations slant board      Knee/Hip Exercises: Aerobic   Nustep L3 x 8 minutes      Knee/Hip Exercises: Standing   Heel Raises Both;15 reps    Heel Raises Limitations toe raises x 15    Other Standing Knee Exercises 4 way hip exercises x 12 each  LE with UE support      Knee/Hip Exercises: Seated   Long Arc Quad Strengthening;Both;2 sets;10 reps      Knee/Hip Exercises: Supine   Bridges Strengthening;Both;2 sets;10 reps                  PT Education - 06/11/20 1101    Education Details added new exercises to pt's HEP    Person(s) Educated Patient    Methods Explanation;Demonstration;Handout;Verbal cues;Tactile cues    Comprehension Verbalized understanding;Returned demonstration            PT Short Term Goals - 06/11/20 1038      PT SHORT TERM GOAL #1   Title [name] will be independent with her day 1 HEP.    Status On-going      PT SHORT TERM GOAL #  2   Title Patient will report 2/10 pain at woirst with walking     Status On-going      PT SHORT TERM GOAL #3   Title Patient will be indepednet with modified exercise program for her     Status On-going             PT Long Term Goals - 05/29/20 1025      PT LONG TERM GOAL #1   Title Improve FOTO to 65.    Baseline 47    Time 4    Period Weeks    Status New    Target Date 06/26/20      PT LONG TERM GOAL #2   Title [name] will report B posterior thigh pain consistently <3/10 on the Numeric Pain Rating Scale.    Baseline Can be 5/10    Time 4    Period Weeks    Status New    Target Date 06/26/20      PT LONG TERM GOAL #3   Title [name] will be able to walk 1/4 mile or more without apprehension and without an assistive device.    Baseline Uses trekking pole.    Time 4    Period Weeks    Status New    Target Date 06/26/20      PT LONG TERM GOAL #4   Title [name] will be independent with her long-term HEP.    Time 4    Period Weeks    Status  New    Target Date 06/26/20                 Plan - 06/11/20 1026    Clinical Impression Statement Pt arriving today reporting getting Covid following her last visit. Pt reporting no pain more fatigue as she is recovering from Covid. Pt tolerating gentle strengthening exercises well with rest breaks as needed. We began standing LE strengthening and balance activities. We added hamstring stretching and bridging to pt's HEP.  Continue skilled PT to maximize function.    Personal Factors and Comorbidities Age;Comorbidity 3+    Comorbidities Previous L4-5 decompression, R leg weakness, HTN    Examination-Activity Limitations Bed Mobility;Bend;Lift;Squat;Stairs;Locomotion Level    Examination-Participation Restrictions Community Activity    Stability/Clinical Decision Making Stable/Uncomplicated    Rehab Potential Good    PT Frequency 2x / week    PT Duration 4 weeks    PT Treatment/Interventions ADLs/Self Care Home Management;Cryotherapy;Therapeutic activities;Stair training;Gait training;Therapeutic exercise;Balance training;Neuromuscular re-education;Patient/family education;Manual techniques    PT Next Visit Plan Progress LE strengthening (gradually), postural strength and balance    PT Home Exercise Plan Access Code: [email or web address]    Consulted and Agree with Plan of Care Patient           Patient will benefit from skilled therapeutic intervention in order to improve the following deficits and impairments:  Abnormal gait,Decreased activity tolerance,Decreased balance,Decreased endurance,Decreased strength,Difficulty walking,Increased edema,Impaired flexibility,Postural dysfunction,Improper body mechanics,Pain  Visit Diagnosis: Difficulty walking  Muscle weakness (generalized)  Abnormal posture  Pain in left thigh  Pain in right thigh  Chronic bilateral low back pain without sciatica  Other abnormalities of gait and mobility     Problem List Patient Active Problem List    Diagnosis Date Noted  . HYPERLIPIDEMIA 04/18/2007  . ALLERGIC RHINITIS 04/18/2007  . PULMONARY NODULE 04/18/2007  . CARCINOMA, RECTUM 04/17/2007  . ANXIETY 04/17/2007  . HYPERTENSION 04/17/2007  . COLITIS, ULCERATIVE 04/17/2007    Sandy Trujillo, PT, MPT 06/11/2020,  [facility] [address] [place], [place], [address] Phone: [phone]   Fax:  [phone]  Name: MARTIA DALBY MRN: [id number] Date of Birth: Jul 11, 1932

## 2020-06-13 ENCOUNTER — Other Ambulatory Visit: Payer: Self-pay

## 2020-06-13 ENCOUNTER — Encounter: Payer: Self-pay | Admitting: Rehabilitative and Restorative Service Providers"

## 2020-06-13 ENCOUNTER — Ambulatory Visit (INDEPENDENT_AMBULATORY_CARE_PROVIDER_SITE_OTHER): Payer: Medicare Other | Admitting: Rehabilitative and Restorative Service Providers"

## 2020-06-13 DIAGNOSIS — R293 Abnormal posture: Secondary | ICD-10-CM | POA: Diagnosis not present

## 2020-06-13 DIAGNOSIS — R262 Difficulty in walking, not elsewhere classified: Secondary | ICD-10-CM | POA: Diagnosis not present

## 2020-06-13 DIAGNOSIS — M79652 Pain in left thigh: Secondary | ICD-10-CM | POA: Diagnosis not present

## 2020-06-13 DIAGNOSIS — M6281 Muscle weakness (generalized): Secondary | ICD-10-CM | POA: Diagnosis not present

## 2020-06-13 DIAGNOSIS — M79651 Pain in right thigh: Secondary | ICD-10-CM

## 2020-06-13 NOTE — Therapy (Signed)
[facility] Physical Therapy [address] [place], [place], [address] Phone: [phone]   Fax:  [phone]  Physical Therapy Treatment  Patient Details  Name: Sandy Trujillo MRN: [id number] Date of Birth: [birth year] Referring Provider (PT): Mcarthur Rossetti MD   Encounter Date: 06/13/2020   PT End of Session - 06/13/20 1106    Visit Number 3    Number of Visits 8    PT Start Time 2426    PT Stop Time 8341    PT Time Calculation (min) 48 min    Activity Tolerance Patient tolerated treatment well;No increased pain    Behavior During Therapy WFL for tasks assessed/performed           Past Medical History:  Diagnosis Date  . Hyperparathyroidism ([place]) 2019  . Ileostomy in place [facility])   . Osteoporosis 2019  . rectal ca 2007   surg only    Past Surgical History:  Procedure Laterality Date  . ABDOMINAL HYSTERECTOMY    . BREAST EXCISIONAL BIOPSY Bilateral    benign  . COLON SURGERY  2007    There were no vitals filed for this visit.   Subjective Assessment - 06/13/20 1059    Subjective I am doing good. I have worked out 4x this week, 2x with my trainer and this makes the second visit with you all. It feels good.    Currently in Pain? No/denies    Pain Score 0-No pain                             [facility] Adult PT Treatment/Exercise - 06/13/20 0001      Knee/Hip Exercises: Supine   Other Supine Knee/Hip Exercises LTR x 10, SLR x 12; SLR/hip abdct combo x 10; hamstring stretch 2x30 sec; glute set with bridge x 15; glute set/bridge/hip abdct and addct combo x 12; HS digs x 12 with 2-3 sec hold. Hamstring stretch 2x30 sec. All exercises performed bil.                  PT Education - 06/13/20 1129    Education Details Reviewed HEP with pt able to return demo; discussed benefits of water aerobics/walking    Person(s) Educated Patient    Methods Explanation;Demonstration    Comprehension Verbalized  understanding;Returned demonstration            PT Short Term Goals - 06/11/20 1038      PT SHORT TERM GOAL #1   Title Sandy Trujillo will be independent with her day 1 HEP.    Status On-going      PT SHORT TERM GOAL #2   Title Patient will report 2/10 pain at woirst with walking     Status On-going      PT SHORT TERM GOAL #3   Title Patient will be indepednet with modified exercise program for her     Status On-going             PT Long Term Goals - 05/29/20 1025      PT LONG TERM GOAL #1   Title Improve FOTO to 65.    Baseline 47    Time 4    Period Weeks    Status New    Target Date 06/26/20      PT LONG TERM GOAL #2   Title Sandy Trujillo will report B posterior thigh pain consistently <3/10 on the Numeric Pain Rating Scale.    Baseline Can  be 5/10    Time 4    Period Weeks    Status New    Target Date 06/26/20      PT LONG TERM GOAL #3   Title Sandy Trujillo will be able to walk 1/4 mile or more without apprehension and without an assistive device.    Baseline Uses trekking pole.    Time 4    Period Weeks    Status New    Target Date 06/26/20      PT LONG TERM GOAL #4   Title Sandy Trujillo will be independent with her long-term HEP.    Time 4    Period Weeks    Status New    Target Date 06/26/20                 Plan - 06/13/20 1116    Clinical Impression Statement Pt presents to PT with no complaints of LE/LBP. She has worked out for 4 days this week counting PT visits. She performs SKTC every morning upon awakening to stretch her out prior to getting out of bed. Pt would benefit from continued PT for LE strengthening and balance per POC. No pain after treatment.    Rehab Potential Good    PT Frequency 2x / week    PT Duration 4 weeks    PT Treatment/Interventions ADLs/Self Care Home Management;Cryotherapy;Therapeutic activities;Stair training;Gait training;Therapeutic exercise;Balance training;Neuromuscular re-education;Patient/family education;Manual techniques    PT Next  Visit Plan Progress LE strengthening (gradually), postural strength and balance    Consulted and Agree with Plan of Care Patient           Patient will benefit from skilled therapeutic intervention in order to improve the following deficits and impairments:  Abnormal gait,Decreased activity tolerance,Decreased balance,Decreased endurance,Decreased strength,Difficulty walking,Increased edema,Impaired flexibility,Postural dysfunction,Improper body mechanics,Pain  Visit Diagnosis: Difficulty walking  Muscle weakness (generalized)  Abnormal posture  Pain in left thigh  Pain in right thigh     Problem List Patient Active Problem List   Diagnosis Date Noted  . HYPERLIPIDEMIA 04/18/2007  . ALLERGIC RHINITIS 04/18/2007  . PULMONARY NODULE 04/18/2007  . CARCINOMA, RECTUM 04/17/2007  . ANXIETY 04/17/2007  . HYPERTENSION 04/17/2007  . COLITIS, ULCERATIVE 04/17/2007    America Brown, PT, DPT 06/13/2020, 11:43 AM  [facility] Physical Therapy [address] [place], [place], [address] Phone: [phone]   Fax:  [phone]  Name: Sandy Trujillo MRN: [id number] Date of Birth: 1932-04-18

## 2020-06-16 ENCOUNTER — Other Ambulatory Visit: Payer: Self-pay

## 2020-06-16 ENCOUNTER — Ambulatory Visit (INDEPENDENT_AMBULATORY_CARE_PROVIDER_SITE_OTHER): Payer: Medicare Other | Admitting: Physical Therapy

## 2020-06-16 ENCOUNTER — Encounter: Payer: Self-pay | Admitting: Physical Therapy

## 2020-06-16 DIAGNOSIS — M25552 Pain in left hip: Secondary | ICD-10-CM

## 2020-06-16 DIAGNOSIS — R293 Abnormal posture: Secondary | ICD-10-CM | POA: Diagnosis not present

## 2020-06-16 DIAGNOSIS — M79651 Pain in right thigh: Secondary | ICD-10-CM | POA: Diagnosis not present

## 2020-06-16 DIAGNOSIS — R2689 Other abnormalities of gait and mobility: Secondary | ICD-10-CM | POA: Diagnosis not present

## 2020-06-16 DIAGNOSIS — M79652 Pain in left thigh: Secondary | ICD-10-CM

## 2020-06-16 DIAGNOSIS — R262 Difficulty in walking, not elsewhere classified: Secondary | ICD-10-CM

## 2020-06-16 DIAGNOSIS — G8929 Other chronic pain: Secondary | ICD-10-CM | POA: Diagnosis not present

## 2020-06-16 DIAGNOSIS — M545 Low back pain, unspecified: Secondary | ICD-10-CM

## 2020-06-16 DIAGNOSIS — M6281 Muscle weakness (generalized): Secondary | ICD-10-CM

## 2020-06-16 NOTE — Therapy (Signed)
[facility] Physical Therapy [address] [place], [place], [address] Phone: [phone]   Fax:  [phone]  Physical Therapy Treatment  Patient Details  Name: Sandy Trujillo MRN: [id number] Date of Birth: 02/09/1932 Referring Provider (PT): Mcarthur Rossetti MD   Encounter Date: 06/16/2020   PT End of Session - 06/16/20 1205    Visit Number 4    Number of Visits 8    PT Start Time 9528    PT Stop Time 1223    PT Time Calculation (min) 38 min    Activity Tolerance Patient tolerated treatment well;No increased pain    Behavior During Therapy WFL for tasks assessed/performed           Past Medical History:  Diagnosis Date  . Hyperparathyroidism ([place]) 2019  . Ileostomy in place [facility])   . Osteoporosis 2019  . rectal ca 2007   surg only    Past Surgical History:  Procedure Laterality Date  . ABDOMINAL HYSTERECTOMY    . BREAST EXCISIONAL BIOPSY Bilateral    benign  . COLON SURGERY  2007    There were no vitals filed for this visit.   Subjective Assessment - 06/16/20 1201    Subjective Pt arriving to therapy reporting feeling so much better. Pt to therapy reporting walking more without her can at home.    Pertinent History HTN, previous lumbar decompression L4-5 in August 2020, HTN    Limitations House hold activities;Lifting;Walking    How long can you walk comfortably? Hamstrings discomfort with walking    Patient Stated Goals Return to working out at the gym and be able to walk normally without hamstrings discomfort.    Currently in Pain? No/denies                             [facility] Adult PT Treatment/Exercise - 06/16/20 0001      Knee/Hip Exercises: Stretches   Active Hamstring Stretch Both;3 reps;20 seconds    Gastroc Stretch 20 seconds;3 reps    Gastroc Stretch Limitations using towel roll      Knee/Hip Exercises: Aerobic   Nustep L3 x 8 minutes      Knee/Hip Exercises: Standing   Heel Raises 15 reps     Heel Raises Limitations toe raises x 15    Other Standing Knee Exercises hip abduction, hip extension and hamstring curls x 15 each LE      Knee/Hip Exercises: Seated   Long Arc Quad --    Long Arc Quad Limitations --      Knee/Hip Exercises: Supine   Bridges Strengthening;Both;10 reps;Limitations    Bridges Limitations holding 5 seconds    Straight Leg Raises Strengthening;Both;15 reps                    PT Short Term Goals - 06/16/20 1219      PT SHORT TERM GOAL #1   Title [name] will be independent with her day 1 HEP.    Status On-going      PT SHORT TERM GOAL #2   Title Patient will report 2/10 pain at woirst with walking     Status On-going      PT SHORT TERM GOAL #3   Title Patient will be indepednet with modified exercise program for her     Status On-going             PT Long Term Goals - 05/29/20 1025  PT LONG TERM GOAL #1   Title Improve FOTO to 81.    Baseline 47    Time 4    Period Weeks    Status New    Target Date 06/26/20      PT LONG TERM GOAL #2   Title [name] will report B posterior thigh pain consistently <3/10 on the Numeric Pain Rating Scale.    Baseline Can be 5/10    Time 4    Period Weeks    Status New    Target Date 06/26/20      PT LONG TERM GOAL #3   Title [name] will be able to walk 1/4 mile or more without apprehension and without an assistive device.    Baseline Uses trekking pole.    Time 4    Period Weeks    Status New    Target Date 06/26/20      PT LONG TERM GOAL #4   Title [name] will be independent with her long-term HEP.    Time 4    Period Weeks    Status New    Target Date 06/26/20                 Plan - 06/16/20 1205    Clinical Impression Statement Pt reporting soreness after her last treatment. Pt stated she is still meeting her personal trainer but has been focusing on upper body. Pt tolerating exercises well. Continue skilled PT to maximize function.    Personal Factors and Comorbidities  Age;Comorbidity 3+    Comorbidities Previous L4-5 decompression, R leg weakness, HTN    Examination-Activity Limitations Bed Mobility;Bend;Lift;Squat;Stairs;Locomotion Level    Examination-Participation Restrictions Community Activity    Stability/Clinical Decision Making Stable/Uncomplicated    Rehab Potential Good    PT Frequency 2x / week    PT Duration 4 weeks    PT Treatment/Interventions ADLs/Self Care Home Management;Cryotherapy;Therapeutic activities;Stair training;Gait training;Therapeutic exercise;Balance training;Neuromuscular re-education;Patient/family education;Manual techniques    PT Next Visit Plan Progress LE strengthening (gradually), postural strength and balance    PT Home Exercise Plan Access Code: [email or web address]    Consulted and Agree with Plan of Care Patient           Patient will benefit from skilled therapeutic intervention in order to improve the following deficits and impairments:  Abnormal gait,Decreased activity tolerance,Decreased balance,Decreased endurance,Decreased strength,Difficulty walking,Increased edema,Impaired flexibility,Postural dysfunction,Improper body mechanics,Pain  Visit Diagnosis: Difficulty walking  Muscle weakness (generalized)  Abnormal posture  Pain in left thigh  Pain in right thigh  Chronic bilateral low back pain without sciatica  Other abnormalities of gait and mobility  Pain in left hip     Problem List Patient Active Problem List   Diagnosis Date Noted  . HYPERLIPIDEMIA 04/18/2007  . ALLERGIC RHINITIS 04/18/2007  . PULMONARY NODULE 04/18/2007  . CARCINOMA, RECTUM 04/17/2007  . ANXIETY 04/17/2007  . HYPERTENSION 04/17/2007  . COLITIS, ULCERATIVE 04/17/2007    Oretha Caprice, PT, MPT 06/16/2020, 12:25 PM  [facility] Physical Therapy [address] [place], [place], [address] Phone: [phone]   Fax:  [phone]  Name: Sandy Trujillo MRN: [id number] Date of Birth: Jun 11, 1932

## 2020-06-20 ENCOUNTER — Other Ambulatory Visit: Payer: Self-pay

## 2020-06-20 ENCOUNTER — Encounter: Payer: Self-pay | Admitting: Rehabilitative and Restorative Service Providers"

## 2020-06-20 ENCOUNTER — Ambulatory Visit (INDEPENDENT_AMBULATORY_CARE_PROVIDER_SITE_OTHER): Payer: Medicare Other | Admitting: Rehabilitative and Restorative Service Providers"

## 2020-06-20 DIAGNOSIS — M6281 Muscle weakness (generalized): Secondary | ICD-10-CM

## 2020-06-20 DIAGNOSIS — R262 Difficulty in walking, not elsewhere classified: Secondary | ICD-10-CM | POA: Diagnosis not present

## 2020-06-20 DIAGNOSIS — M545 Low back pain, unspecified: Secondary | ICD-10-CM

## 2020-06-20 DIAGNOSIS — M25552 Pain in left hip: Secondary | ICD-10-CM | POA: Diagnosis not present

## 2020-06-20 DIAGNOSIS — G8929 Other chronic pain: Secondary | ICD-10-CM

## 2020-06-20 DIAGNOSIS — R293 Abnormal posture: Secondary | ICD-10-CM

## 2020-06-20 NOTE — Therapy (Signed)
[facility] Physical Therapy [address] [place], [place], [address] Phone: [phone]   Fax:  [phone]  Physical Therapy Treatment  Patient Details  Name: Sandy Trujillo MRN: [id number] Date of Birth: 03/11/1932 Referring Provider (PT): Mcarthur Rossetti MD   Encounter Date: 06/20/2020   PT End of Session - 06/20/20 1703    Visit Number 5    Number of Visits 8    PT Start Time 8867    PT Stop Time 1100    PT Time Calculation (min) 45 min    Equipment Utilized During Treatment Gait belt    Activity Tolerance Patient tolerated treatment well;No increased pain    Behavior During Therapy WFL for tasks assessed/performed           Past Medical History:  Diagnosis Date  . Hyperparathyroidism ([place]) 2019  . Ileostomy in place [facility])   . Osteoporosis 2019  . rectal ca 2007   surg only    Past Surgical History:  Procedure Laterality Date  . ABDOMINAL HYSTERECTOMY    . BREAST EXCISIONAL BIOPSY Bilateral    benign  . COLON SURGERY  2007    There were no vitals filed for this visit.   Subjective Assessment - 06/20/20 1022    Subjective Sandy Trujillo report less pain since starting physical therapy.  She is most concerned about her balance.    Pertinent History HTN, previous lumbar decompression L4-5 in August 2020, HTN    Limitations House hold activities;Lifting;Walking    How long can you sit comfortably? Not limited, just doesn't sit too long    How long can you stand comfortably? 30 minutes    How long can you walk comfortably? Hamstrings discomfort with walking more than 4 blocks    Patient Stated Goals Return to working out at the gym and be able to walk normally without hamstrings discomfort.    Currently in Pain? No/denies    Pain Score 0-No pain    Pain Location Hip    Pain Orientation Right;Left    Pain Descriptors / Indicators Sore    Pain Type Acute pain    Pain Radiating Towards NA    Pain Onset More than a month ago    Pain  Frequency Occasional    Aggravating Factors  Walking (although better)    Pain Relieving Factors Exercises    Effect of Pain on Daily Activities Is apprehensive because of balance deficits, hamstrings doing better    Multiple Pain Sites No              [facility] PT Assessment - 06/20/20 0001      Observation/Other Assessments   Focus on Therapeutic Outcomes (FOTO)  52 (was 47, Goal 65)                         [facility] Adult PT Treatment/Exercise - 06/20/20 0001      Posture/Postural Control   Posture/Postural Control Postural limitations    Postural Limitations Forward head;Rounded Shoulders;Decreased lumbar lordosis      Neuro Re-ed    Neuro Re-ed Details  Tandem balance (as narrow as possible) eyes open and closed 4X 20 seconds each, long strides with gait and walking backwards      Exercises   Exercises Knee/Hip      Knee/Hip Exercises: Aerobic   Nustep Level 4 for 8 minutes      Knee/Hip Exercises: Standing   Heel Raises Both;1 set;15 reps;3 seconds;Limitations  Heel Raises Limitations Heel and toe raises    Other Standing Knee Exercises Standing trunk extension AROM 3 sets of 5X 3 seconds    Other Standing Knee Exercises Shoulder blade pinches 3 sets of 5 for 5 seconds      Knee/Hip Exercises: Seated   Sit to Sand 2 sets;5 reps;without UE support                  PT Education - 06/20/20 1701    Education Details Reviewed some HEP activities from visit 1 and progressed balance challenges.    Person(s) Educated Patient    Methods Explanation;Demonstration;Verbal cues;Handout    Comprehension Verbalized understanding;Returned demonstration;Need further instruction;Verbal cues required            PT Short Term Goals - 06/20/20 1702      PT SHORT TERM GOAL #1   Title Sandy Trujillo will be independent with her day 1 HEP.    Status On-going      PT SHORT TERM GOAL #2   Title Patient will report 2/10 pain at woirst with walking     Status On-going       PT SHORT TERM GOAL #3   Title Patient will be indepednet with modified exercise program for her     Status On-going             PT Long Term Goals - 06/20/20 1702      PT LONG TERM GOAL #1   Title Improve FOTO to 65.    Baseline 47    Time 4    Period Weeks    Status On-going      PT LONG TERM GOAL #2   Title Sandy Trujillo will report B posterior thigh pain consistently <3/10 on the Numeric Pain Rating Scale.    Baseline Much better 0-3/10    Time 4    Period Weeks    Status Partially Met      PT LONG TERM GOAL #3   Title Sandy Trujillo will be able to walk 1/4 mile or more without apprehension and without an assistive device.    Baseline Uses trekking pole.    Time 4    Period Weeks    Status On-going      PT LONG TERM GOAL #4   Title Sandy Trujillo will be independent with her long-term HEP.    Time 4    Period Weeks    Status On-going                 Plan - 06/20/20 1703    Clinical Impression Statement Sandy Trujillo is noticing progress with her hip/hamstrings strain.  She is most concerned about her balance and this was a major emphasis of today's visit.  Recommend Berg next visit with modifications to her HEP as needed per [employer].    Personal Factors and Comorbidities Age;Comorbidity 3+    Comorbidities Previous L4-5 decompression, R leg weakness, HTN    Examination-Activity Limitations Bed Mobility;Bend;Lift;Squat;Stairs;Locomotion Level    Examination-Participation Restrictions Community Activity    Stability/Clinical Decision Making Stable/Uncomplicated    Rehab Potential Good    PT Frequency 2x / week    PT Duration 4 weeks    PT Treatment/Interventions ADLs/Self Care Home Management;Cryotherapy;Therapeutic activities;Stair training;Gait training;Therapeutic exercise;Balance training;Neuromuscular re-education;Patient/family education;Manual techniques    PT Next Visit Plan Berg and modify HEP as needed per Merrilee Jansky results    PT Home Exercise Plan Access Code: [email or web address]     Consulted and Agree with Plan of  Care Patient           Patient will benefit from skilled therapeutic intervention in order to improve the following deficits and impairments:  Abnormal gait,Decreased activity tolerance,Decreased balance,Decreased endurance,Decreased strength,Difficulty walking,Increased edema,Impaired flexibility,Postural dysfunction,Improper body mechanics,Pain  Visit Diagnosis: Difficulty walking  Muscle weakness (generalized)  Abnormal posture  Chronic bilateral low back pain without sciatica  Pain in left hip     Problem List Patient Active Problem List   Diagnosis Date Noted  . HYPERLIPIDEMIA 04/18/2007  . ALLERGIC RHINITIS 04/18/2007  . PULMONARY NODULE 04/18/2007  . CARCINOMA, RECTUM 04/17/2007  . ANXIETY 04/17/2007  . HYPERTENSION 04/17/2007  . COLITIS, ULCERATIVE 04/17/2007    Farley Ly PT, MPT 06/20/2020, 5:06 PM  [facility] Physical Therapy [address] [place], [place], [address] Phone: [phone]   Fax:  [phone]  Name: Sandy Trujillo MRN: [id number] Date of Birth: [birth year]

## 2020-06-20 NOTE — Patient Instructions (Signed)
Access Code: [email or web address] URL: [email or web address] Date: 06/20/2020 Prepared by: Vista Mink  Exercises Standing Lumbar Extension at [place] 5 x daily - 7 x weekly - 1 sets - 5 reps - 3 seconds hold Standing Scapular Retraction - 5 x daily - 7 x weekly - 1 sets - 5 reps - 5 second hold Sit to Stand with Armchair - 2 x daily - 7 x weekly - 1 sets - 5 reps Heel Toe Raises with Counter Support - 2 x daily - 7 x weekly - 1 sets - 15 reps - 3 seconds hold Seated Hamstring Stretch with Strap - 2 x daily - 7 x weekly - 3-5 reps - 30 seconds hold Supine Bridge - 2 x daily - 7 x weekly - 10 reps - 3-5 seconds hold Supine Isometric Hamstring Set - 2 x daily - 7 x weekly - 10 reps - 5seconds hold Tandem Stance - 2 x daily - 7 x weekly - 1 sets - 5 reps - 20 second hold

## 2020-06-24 ENCOUNTER — Ambulatory Visit (INDEPENDENT_AMBULATORY_CARE_PROVIDER_SITE_OTHER): Payer: Medicare Other | Admitting: Physical Therapy

## 2020-06-24 ENCOUNTER — Other Ambulatory Visit: Payer: Self-pay

## 2020-06-24 ENCOUNTER — Encounter: Payer: Self-pay | Admitting: Physical Therapy

## 2020-06-24 DIAGNOSIS — R293 Abnormal posture: Secondary | ICD-10-CM

## 2020-06-24 DIAGNOSIS — M79652 Pain in left thigh: Secondary | ICD-10-CM | POA: Diagnosis not present

## 2020-06-24 DIAGNOSIS — G8929 Other chronic pain: Secondary | ICD-10-CM

## 2020-06-24 DIAGNOSIS — R262 Difficulty in walking, not elsewhere classified: Secondary | ICD-10-CM | POA: Diagnosis not present

## 2020-06-24 DIAGNOSIS — M6281 Muscle weakness (generalized): Secondary | ICD-10-CM | POA: Diagnosis not present

## 2020-06-24 DIAGNOSIS — M79651 Pain in right thigh: Secondary | ICD-10-CM

## 2020-06-24 DIAGNOSIS — M545 Low back pain, unspecified: Secondary | ICD-10-CM | POA: Diagnosis not present

## 2020-06-24 DIAGNOSIS — M25552 Pain in left hip: Secondary | ICD-10-CM

## 2020-06-24 NOTE — Therapy (Signed)
[facility] Physical Therapy [address] [place], [place], [address] Phone: [phone]   Fax:  [phone]  Physical Therapy Treatment  Patient Details  Name: Sandy Trujillo MRN: [id number] Date of Birth: February 26, 1932 Referring Provider (PT): Mcarthur Rossetti MD   Encounter Date: 06/24/2020   PT End of Session - 06/24/20 1158    Visit Number 6    Number of Visits 8    PT Start Time 6270    PT Stop Time 1223    PT Time Calculation (min) 38 min    Equipment Utilized During Treatment Gait belt    Activity Tolerance Patient tolerated treatment well;No increased pain    Behavior During Therapy WFL for tasks assessed/performed           Past Medical History:  Diagnosis Date  . Hyperparathyroidism ([place]) 2019  . Ileostomy in place [facility])   . Osteoporosis 2019  . rectal ca 2007   surg only    Past Surgical History:  Procedure Laterality Date  . ABDOMINAL HYSTERECTOMY    . BREAST EXCISIONAL BIOPSY Bilateral    benign  . COLON SURGERY  2007    There were no vitals filed for this visit.   Subjective Assessment - 06/24/20 1155    Subjective Pt arriving today with 1/10 pain on left posterior leg. Pt feels she is doing so much better.    Pertinent History HTN, previous lumbar decompression L4-5 in August 2020, HTN    Limitations House hold activities;Lifting;Walking    How long can you sit comfortably? Not limited, just doesn't sit too long    Patient Stated Goals Return to working out at the gym and be able to walk normally without hamstrings discomfort.    Currently in Pain? Yes    Pain Score 1     Pain Location Hip    Pain Orientation Left    Pain Descriptors / Indicators Sore    Pain Type Acute pain    Pain Onset More than a month ago    Pain Frequency Occasional              [facility] PT Assessment - 06/24/20 0001      Standardized Balance Assessment   Standardized Balance Assessment Berg Balance Test      Berg Balance Test   Sit  to Stand Able to stand without using hands and stabilize independently    Standing Unsupported Able to stand safely 2 minutes    Sitting with Back Unsupported but Feet Supported on Floor or Stool Able to sit safely and securely 2 minutes    Stand to Sit Sits safely with minimal use of hands    Transfers Able to transfer safely, minor use of hands    Standing Unsupported with Eyes Closed Able to stand 10 seconds safely    Standing Unsupported with Feet Together Able to place feet together independently and stand for 1 minute with supervision    From Standing, Reach Forward with Outstretched Arm Can reach confidently >25 cm (10")    From Standing Position, Pick up Object from Floor Able to pick up shoe, needs supervision    From Standing Position, Turn to Look Behind Over each Shoulder Looks behind one side only/other side shows less weight shift    Turn 360 Degrees Able to turn 360 degrees safely but slowly    Standing Unsupported, Alternately Place Feet on Step/Stool Able to complete >2 steps/needs minimal assist    Standing Unsupported, One Foot in [employer]  Able to plae foot ahead of the other independently and hold 30 seconds    Standing on One Leg Able to lift leg independently and hold 5-10 seconds    Total Score 46                         [facility] Adult PT Treatment/Exercise - 06/24/20 0001      Neuro Re-ed    Neuro Re-ed Details  SLS with intermittent UE support with close supervision in parallel bars      Exercises   Exercises Knee/Hip      Knee/Hip Exercises: Stretches   Gastroc Stretch 3 reps;30 seconds    Gastroc Stretch Limitations slant board      Knee/Hip Exercises: Aerobic   Nustep Level 4 for 8 minutes      Knee/Hip Exercises: Standing   Heel Raises Both;1 set;15 reps;3 seconds;Limitations    Heel Raises Limitations Heel and toe raises    Other Standing Knee Exercises hip abduction, hip extension and hamstring curls each x 15 each      Knee/Hip Exercises:  Seated   Sit to Sand 10 reps;without UE support      Knee/Hip Exercises: Supine   Bridges 15 reps    Bridges Limitations holding 5 seconds                    PT Short Term Goals - 06/24/20 1202      PT SHORT TERM GOAL #1   Title Maguire will be independent with her day 1 HEP.    Status Achieved      PT SHORT TERM GOAL #2   Title Patient will report 2/10 pain at woirst with walking     Status On-going      PT SHORT TERM GOAL #3   Title Patient will be indepednet with modified exercise program for her     Status On-going             PT Long Term Goals - 06/24/20 1202      PT LONG TERM GOAL #1   Title Improve FOTO to 65.    Status On-going      PT LONG TERM GOAL #2   Title Ebonye will report B posterior thigh pain consistently <3/10 on the Numeric Pain Rating Scale.    Status Partially Met      PT LONG TERM GOAL #3   Title Angelynn will be able to walk 1/4 mile or more without apprehension and without an assistive device.    Status On-going      PT LONG TERM GOAL #4   Title Helem will be independent with her long-term HEP.    Status On-going      PT LONG TERM GOAL #5   Title She will improve FGA to at least 22/30 to show improved balance                 Plan - 06/24/20 1158    Clinical Impression Statement Pt arriving reporting mild sorness today in posterior left hip/hamstring area. Focusing on LE stretching/ strengthening and balance. BERG balance score was 46/56 today. Pt stating she has a fear of falling and feels off balance when she's out in the communtiy especially on concrete surfaces.Updated HEP, with more SLS exercises as well as strengthening.  Continue skilled PT to progress toward maximal functional level.    Personal Factors and Comorbidities Age;Comorbidity 3+    Comorbidities Previous L4-5  decompression, R leg weakness, HTN    Examination-Activity Limitations Bed Mobility;Bend;Lift;Squat;Stairs;Locomotion Level     Examination-Participation Restrictions Community Activity    Stability/Clinical Decision Making Stable/Uncomplicated    Rehab Potential Good    PT Frequency 2x / week    PT Duration 4 weeks    PT Treatment/Interventions ADLs/Self Care Home Management;Cryotherapy;Therapeutic activities;Stair training;Gait training;Therapeutic exercise;Balance training;Neuromuscular re-education;Patient/family education;Manual techniques    PT Next Visit Plan LE streatching, strengtehing, dynamic balance    PT Home Exercise Plan Access Code: [email or web address]  URL: [email or web address]  Date: 06/24/2020  Prepared by: Kearney Hard    Exercises  Standing Lumbar Extension at [place] - 5 x daily - 7 x weekly - 1 sets - 5 reps - 3 seconds hold  Standing Scapular Retraction - 5 x daily - 7 x weekly - 1 sets - 5 reps - 5 second hold  Sit to Stand with Armchair - 2 x daily - 7 x weekly - 1 sets - 5 reps  Seated Hamstring Stretch with Strap - 2 x daily - 7 x weekly - 3-5 reps - 30 seconds hold  Supine Bridge - 2 x daily - 7 x weekly - 10 reps - 3-5 seconds hold  Supine Isometric Hamstring Set - 2 x daily - 7 x weekly - 10 reps - 5seconds hold  Tandem Stance - 2 x daily - 7 x weekly - 1 sets - 5 reps - 20 second hold  Standing Hip Abduction with Counter Support - 1 x daily - 7 x weekly - 15 reps  Standing Hip Extension with Counter Support - 1 x daily - 7 x weekly - 15 reps  Heel Toe Raises with Counter Support - 1 x daily - 7 x weekly - 10 reps  Heel rises with counter support - 1 x daily - 7 x weekly - 10 reps    Consulted and Agree with Plan of Care Patient           Patient will benefit from skilled therapeutic intervention in order to improve the following deficits and impairments:  Abnormal gait,Decreased activity tolerance,Decreased balance,Decreased endurance,Decreased strength,Difficulty walking,Increased edema,Impaired flexibility,Postural dysfunction,Improper body mechanics,Pain  Visit  Diagnosis: Difficulty walking  Muscle weakness (generalized)  Abnormal posture  Chronic bilateral low back pain without sciatica  Pain in left hip  Pain in left thigh  Pain in right thigh     Problem List Patient Active Problem List   Diagnosis Date Noted  . HYPERLIPIDEMIA 04/18/2007  . ALLERGIC RHINITIS 04/18/2007  . PULMONARY NODULE 04/18/2007  . CARCINOMA, RECTUM 04/17/2007  . ANXIETY 04/17/2007  . HYPERTENSION 04/17/2007  . COLITIS, ULCERATIVE 04/17/2007    Oretha Caprice, PT, MPT 06/24/2020, 12:22 PM  [facility] Physical Therapy [address] [place], [place], [address] Phone: [phone]   Fax:  [phone]  Name: Sandy Trujillo MRN: [id number] Date of Birth: 09/19/1932

## 2020-06-26 ENCOUNTER — Encounter: Payer: Self-pay | Admitting: Rehabilitative and Restorative Service Providers"

## 2020-06-26 ENCOUNTER — Other Ambulatory Visit: Payer: Self-pay

## 2020-06-26 ENCOUNTER — Ambulatory Visit (INDEPENDENT_AMBULATORY_CARE_PROVIDER_SITE_OTHER): Payer: Medicare Other | Admitting: Rehabilitative and Restorative Service Providers"

## 2020-06-26 DIAGNOSIS — M6281 Muscle weakness (generalized): Secondary | ICD-10-CM | POA: Diagnosis not present

## 2020-06-26 DIAGNOSIS — R293 Abnormal posture: Secondary | ICD-10-CM

## 2020-06-26 DIAGNOSIS — R262 Difficulty in walking, not elsewhere classified: Secondary | ICD-10-CM | POA: Diagnosis not present

## 2020-06-26 NOTE — Therapy (Signed)
[facility] Physical Therapy [address] [place], [place], [address] Phone: [phone]   Fax:  [phone]  Physical Therapy Treatment/Recertification  Patient Details  Name: Sandy Trujillo MRN: [id number] Date of Birth: 01-Aug-1932 Referring Provider (PT): Mcarthur Rossetti MD   Encounter Date: 06/26/2020   PT End of Session - 06/26/20 1713    Visit Number 7    Number of Visits 8    PT Start Time 7412    PT Stop Time 1226    PT Time Calculation (min) 41 min    Equipment Utilized During Treatment Gait belt    Activity Tolerance Patient tolerated treatment well;No increased pain;Patient limited by fatigue    Behavior During Therapy [facility] for tasks assessed/performed           Past Medical History:  Diagnosis Date  . Hyperparathyroidism ([place]) 2019  . Ileostomy in place [facility])   . Osteoporosis 2019  . rectal ca 2007   surg only    Past Surgical History:  Procedure Laterality Date  . ABDOMINAL HYSTERECTOMY    . BREAST EXCISIONAL BIOPSY Bilateral    benign  . COLON SURGERY  2007    There were no vitals filed for this visit.   Subjective Assessment - 06/26/20 1219    Subjective Hamstrings/posterior thigh pain is much better.  Sandy Trujillo does have concerns with her balance.    Pertinent History HTN, previous lumbar decompression L4-5 in August 2020, HTN    Limitations House hold activities;Lifting;Walking    How long can you sit comfortably? Not limited, just doesn't sit too long    Patient Stated Goals Return to working out at the gym and be able to walk normally without hamstrings discomfort.    Currently in Pain? No/denies    Pain Score 0-No pain    Pain Location Hip    Pain Orientation Left    Pain Radiating Towards NA    Pain Onset More than a month ago    Aggravating Factors  Instability and endurance deficits with WB function    Effect of Pain on Daily Activities No pain, just apprehension and limited endurance    Multiple Pain Sites No               [facility] PT Assessment - 06/26/20 0001      Observation/Other Assessments   Focus on Therapeutic Outcomes (FOTO)  56 (was 47, Goal 65)                         [facility] Adult PT Treatment/Exercise - 06/26/20 0001      Posture/Postural Control   Posture/Postural Control Postural limitations    Postural Limitations Forward head;Rounded Shoulders;Decreased lumbar lordosis      Therapeutic Activites    Therapeutic Activities ADL's    ADL's Step-up and over 4 & 6 inch step      Neuro Re-ed    Neuro Re-ed Details  Tandem balance (as narrow as possible) eyes open, long strides with gait and walking backwards, tap-ups, 360 degree turns and heel to toe raises without UE support      Exercises   Exercises Knee/Hip      Knee/Hip Exercises: Standing   Heel Raises Both;1 set;15 reps;3 seconds;Limitations    Heel Raises Limitations Heel and toe raises    Other Standing Knee Exercises Standing trunk extension AROM 3 sets of 5X 3 seconds    Other Standing Knee Exercises Shoulder blade pinches 3 sets of 5  for 5 seconds      Knee/Hip Exercises: Seated   Sit to Sand 2 sets;5 reps;without UE support                  PT Education - 06/26/20 1710    Education Details Reviewed HEP.  Progressed to steps and curbs with functional work.    Person(s) Educated Patient    Methods Explanation;Demonstration;Verbal cues    Comprehension Verbal cues required;Need further instruction;Returned demonstration;Verbalized understanding            PT Short Term Goals - 06/26/20 1711      PT SHORT TERM GOAL #1   Title Sandy Trujillo will be independent with her day 1 HEP.    Status Achieved      PT SHORT TERM GOAL #2   Title Patient will report 2/10 pain at worst with walking    Status Achieved      PT SHORT TERM GOAL #3   Title Patient will be indepedent with modified exercise program for her hip    Status Achieved             PT Long Term Goals - 06/26/20 1711       PT LONG TERM GOAL #1   Title Improve FOTO to 65.    Baseline 56 at 06/26/2020 RA (was 47 at eval)    Time 4    Period Weeks    Status On-going    Target Date 07/24/20      PT LONG TERM GOAL #2   Title Sandy Trujillo will report B posterior thigh pain consistently <3/10 on the Numeric Pain Rating Scale.    Baseline 0-2/10    Status Achieved      PT LONG TERM GOAL #3   Title Sandy Trujillo will be able to walk 1/4 mile or more without apprehension and without an assistive device.    Baseline Needs cane.  Apprehension and endurance issues.    Time 4    Period Weeks    Status On-going    Target Date 07/24/20      PT LONG TERM GOAL #4   Title Sandy Trujillo will be independent with her long-term HEP.    Status Achieved      PT LONG TERM GOAL #5   Title She will improve FGA to at least 22/30 to show improved balance    Status On-going                 Plan - 06/26/20 1714    Clinical Impression Statement Hamstrings/thigh pain is no longer functionally limiting.  Between the decrease in activity caused by her injury and a battle with Covid, Sandy Trujillo's endurance and balance are still an issue (46/56 Berg Balance Scale).  Because of losing 2 weeks of PT due to her Covid battle, she is a bit behind schedule.  With continued PT, I expect she will meet LTGs in 2-4 weeks.    Personal Factors and Comorbidities Age;Comorbidity 3+    Comorbidities Previous L4-5 decompression, R leg weakness, HTN    Examination-Activity Limitations Bed Mobility;Bend;Lift;Squat;Stairs;Locomotion Level    Examination-Participation Restrictions Community Activity    Stability/Clinical Decision Making Stable/Uncomplicated    Rehab Potential Good    PT Frequency 2x / week    PT Duration 4 weeks    PT Treatment/Interventions ADLs/Self Care Home Management;Cryotherapy;Therapeutic activities;Stair training;Gait training;Therapeutic exercise;Balance training;Neuromuscular re-education;Patient/family education;Manual techniques    PT Next  Visit Plan LE streatching, strengtehing, dynamic balance    PT Home Exercise  Plan Access Code: [email or web address]  URL: [email or web address]  Date: 06/24/2020  Prepared by: Sandy Trujillo    Exercises  Standing Lumbar Extension at [place] - 5 x daily - 7 x weekly - 1 sets - 5 reps - 3 seconds hold  Standing Scapular Retraction - 5 x daily - 7 x weekly - 1 sets - 5 reps - 5 second hold  Sit to Stand with Armchair - 2 x daily - 7 x weekly - 1 sets - 5 reps  Seated Hamstring Stretch with Strap - 2 x daily - 7 x weekly - 3-5 reps - 30 seconds hold  Supine Bridge - 2 x daily - 7 x weekly - 10 reps - 3-5 seconds hold  Supine Isometric Hamstring Set - 2 x daily - 7 x weekly - 10 reps - 5seconds hold  Tandem Stance - 2 x daily - 7 x weekly - 1 sets - 5 reps - 20 second hold  Standing Hip Abduction with Counter Support - 1 x daily - 7 x weekly - 15 reps  Standing Hip Extension with Counter Support - 1 x daily - 7 x weekly - 15 reps  Heel Toe Raises with Counter Support - 1 x daily - 7 x weekly - 10 reps  Heel rises with counter support - 1 x daily - 7 x weekly - 10 reps    Consulted and Agree with Plan of Care Patient           Patient will benefit from skilled therapeutic intervention in order to improve the following deficits and impairments:  Abnormal gait,Decreased activity tolerance,Decreased balance,Decreased endurance,Decreased strength,Difficulty walking,Increased edema,Impaired flexibility,Postural dysfunction,Improper body mechanics,Pain  Visit Diagnosis: Difficulty walking  Muscle weakness (generalized)  Abnormal posture     Problem List Patient Active Problem List   Diagnosis Date Noted  . HYPERLIPIDEMIA 04/18/2007  . ALLERGIC RHINITIS 04/18/2007  . PULMONARY NODULE 04/18/2007  . CARCINOMA, RECTUM 04/17/2007  . ANXIETY 04/17/2007  . HYPERTENSION 04/17/2007  . COLITIS, ULCERATIVE 04/17/2007    Sandy Trujillo PT, MPT 06/26/2020, 5:17 PM  [facility] Physical  Therapy [address] [place], [place], [address] Phone: [phone]   Fax:  [phone]  Name: Sandy Trujillo MRN: [id number] Date of Birth: 12-10-1932

## 2020-07-01 ENCOUNTER — Encounter: Payer: Self-pay | Admitting: Orthopaedic Surgery

## 2020-07-01 ENCOUNTER — Ambulatory Visit (INDEPENDENT_AMBULATORY_CARE_PROVIDER_SITE_OTHER): Payer: Medicare Other | Admitting: Orthopaedic Surgery

## 2020-07-01 DIAGNOSIS — S76311D Strain of muscle, fascia and tendon of the posterior muscle group at thigh level, right thigh, subsequent encounter: Secondary | ICD-10-CM

## 2020-07-01 DIAGNOSIS — S76312D Strain of muscle, fascia and tendon of the posterior muscle group at thigh level, left thigh, subsequent encounter: Secondary | ICD-10-CM | POA: Diagnosis not present

## 2020-07-01 NOTE — Progress Notes (Signed)
The patient is an 87 year old female who is returning for follow-up after having physical therapy to work on her balance and coordination as well as her hamstrings bilaterally.  She reports that she is doing much better as a result of physical therapy.  Notes from therapy also stated that she is doing well but they want to extend her therapy for another 4 weeks to work really on her balance which I agree with given her age.  On exam both hips and knees are moving much more smoothly.  Her hamstring pain is really not present today.  Overall she does look like her balance is getting better.  She is using a cane.  Everything she is doing is appropriate and it is definitely appropriate to get her in to continue therapy for another 4 weeks to decrease her fall risk.  All questions and concerns were answered addressed.  We will order the additional physical therapy and we will see her back as needed.

## 2020-07-03 ENCOUNTER — Ambulatory Visit: Payer: Medicare Other

## 2020-07-03 DIAGNOSIS — Z23 Encounter for immunization: Secondary | ICD-10-CM | POA: Diagnosis not present

## 2020-07-09 ENCOUNTER — Ambulatory Visit (INDEPENDENT_AMBULATORY_CARE_PROVIDER_SITE_OTHER): Payer: Medicare Other | Admitting: Rehabilitative and Restorative Service Providers"

## 2020-07-09 ENCOUNTER — Other Ambulatory Visit: Payer: Self-pay

## 2020-07-09 ENCOUNTER — Encounter: Payer: Self-pay | Admitting: Rehabilitative and Restorative Service Providers"

## 2020-07-09 DIAGNOSIS — R2681 Unsteadiness on feet: Secondary | ICD-10-CM

## 2020-07-09 DIAGNOSIS — R262 Difficulty in walking, not elsewhere classified: Secondary | ICD-10-CM | POA: Diagnosis not present

## 2020-07-09 DIAGNOSIS — M6281 Muscle weakness (generalized): Secondary | ICD-10-CM

## 2020-07-09 NOTE — Therapy (Signed)
[facility] Physical Therapy [address] [place], [place], [address] Phone: [phone]   Fax:  [phone]  Physical Therapy Treatment/Recertification  Patient Details  Name: Sandy Trujillo MRN: [id number] Date of Birth: Sep 18, 1932 Referring Provider (PT): Mcarthur Rossetti MD   Encounter Date: 07/09/2020   PT End of Session - 07/09/20 1527     Visit Number 8    Number of Visits 12    PT Start Time 2694    PT Stop Time 1100    PT Time Calculation (min) 45 min    Equipment Utilized During Treatment Gait belt    Activity Tolerance Patient tolerated treatment well;No increased pain;Patient limited by fatigue    Behavior During Therapy [facility] for tasks assessed/performed             Past Medical History:  Diagnosis Date   Hyperparathyroidism ([place]) 2019   Ileostomy in place [facility])    Osteoporosis 2019   rectal ca 2007   surg only    Past Surgical History:  Procedure Laterality Date   ABDOMINAL HYSTERECTOMY     BREAST EXCISIONAL BIOPSY Bilateral    benign   COLON SURGERY  2007    There were no vitals filed for this visit.   Subjective Assessment - 07/09/20 1059     Subjective Sandy Trujillo reports getting flared-up again with her personal trainer.  Not as bad as before but worse than at her last PT visit.    Pertinent History HTN, previous lumbar decompression L4-5 in August 2020, HTN    Limitations House hold activities;Lifting;Walking    How long can you sit comfortably? Not limited, just doesn't sit too long    How long can you stand comfortably? 30-60 minutes    How long can you walk comfortably? Has not tried > 10-15 minutes but OK with this    Patient Stated Goals Return to working out at the gym and be able to walk normally without hamstrings discomfort.    Currently in Pain? Yes    Pain Score 2     Pain Location Hip    Pain Orientation Left    Pain Descriptors / Indicators Sore;Aching    Pain Type Acute pain    Pain Radiating  Towards NA    Pain Onset More than a month ago    Pain Frequency Occasional    Aggravating Factors  Feels as if her balance isn't as good as it should be    Pain Relieving Factors Exercises    Effect of Pain on Daily Activities Apprehension and limited endurance    Multiple Pain Sites No                               [facility] Adult PT Treatment/Exercise - 07/09/20 0001       Posture/Postural Control   Posture/Postural Control Postural limitations    Postural Limitations Forward head;Rounded Shoulders;Decreased lumbar lordosis      Neuro Re-ed    Neuro Re-ed Details  Tandem balance static and dynamic, heel to toe raises without UE, dynamic balance and tap-ups on 6 inch step      Exercises   Exercises Knee/Hip      Knee/Hip Exercises: Standing   Heel Raises Both;1 set;15 reps;3 seconds;Limitations    Heel Raises Limitations Heel and toe raises    Other Standing Knee Exercises Standing trunk extension AROM 3 sets of 5X 3 seconds    Other Standing Knee  Exercises Shoulder blade pinches 3 sets of 5 for 5 seconds      Knee/Hip Exercises: Seated   Sit to Sand 2 sets;5 reps;without UE support                    PT Education - 07/09/20 1525     Education Details Discussed possibly firing the personal trainer or finding a new one between getting Covid from the trainer (no mask on the trainer) and frequent flare-ups.  Reviewed HEP and progressed some balance challenges.    Person(s) Educated Patient    Methods Explanation;Demonstration;Verbal cues;Handout;Tactile cues    Comprehension Verbalized understanding;Tactile cues required;Need further instruction;Returned demonstration;Verbal cues required              PT Short Term Goals - 07/09/20 1527       PT SHORT TERM GOAL #1   Title Sandy Trujillo will be independent with her day 1 HEP.    Status Achieved      PT SHORT TERM GOAL #2   Title Patient will report 2/10 pain at worst with walking    Status  Achieved      PT SHORT TERM GOAL #3   Title Patient will be indepedent with modified exercise program for her hip    Status Achieved               PT Long Term Goals - 07/09/20 1527       PT LONG TERM GOAL #1   Title Improve FOTO to 65.    Baseline 56 at 06/26/2020 RA (was 47 at eval)    Time 4    Period Weeks    Status On-going      PT LONG TERM GOAL #2   Title Sandy Trujillo will report B posterior thigh pain consistently <3/10 on the Numeric Pain Rating Scale.    Baseline 0-2/10    Status Achieved      PT LONG TERM GOAL #3   Title Sandy Trujillo will be able to walk 1/4 mile or more without apprehension and without an assistive device.    Baseline Needs cane.  Apprehension and endurance issues.    Time 4    Period Weeks    Status On-going      PT LONG TERM GOAL #4   Title Sandy Trujillo will be independent with her long-term HEP.    Status Achieved      PT LONG TERM GOAL #5   Title She will improve FGA to at least 22/30 to show improved balance    Status On-going                   Plan - 07/09/20 1528     Clinical Impression Statement Hamstrings/thigh pain is no longer limiting function.  [name] is much better overall but seems to frequently get flared-up at her personal trainer.  Between getting Covid from an unmasked trainer and her 2nd flare-up from working with the trainer, I suggested maybe firing the trainer to focus on PT exercises.  Sandy Trujillo would like a few more weeks to work on balance (she is apprehensive without her cane) and strength before transfer into independent PT.  She would have graduated if it were not for her Covid interruption and trainer flare-ups.    Personal Factors and Comorbidities Age;Comorbidity 3+    Comorbidities Previous L4-5 decompression, R leg weakness, HTN    Examination-Activity Limitations Bed Mobility;Bend;Lift;Squat;Stairs;Locomotion Level    Examination-Participation Restrictions Community Activity    Stability/Clinical Decision  Making  Stable/Uncomplicated    Rehab Potential Good    PT Frequency 2x / week    PT Duration 4 weeks    PT Treatment/Interventions ADLs/Self Care Home Management;Cryotherapy;Therapeutic activities;Stair training;Gait training;Therapeutic exercise;Balance training;Neuromuscular re-education;Patient/family education;Manual techniques    PT Next Visit Plan Balance, LE strength and proprioception    PT Home Exercise Plan Access Code: [email or web address]  URL: [email or web address]  Date: 06/24/2020  Prepared by: Kearney Hard    Exercises  Standing Lumbar Extension at [place] - 5 x daily - 7 x weekly - 1 sets - 5 reps - 3 seconds hold  Standing Scapular Retraction - 5 x daily - 7 x weekly - 1 sets - 5 reps - 5 second hold  Sit to Stand with Armchair - 2 x daily - 7 x weekly - 1 sets - 5 reps  Seated Hamstring Stretch with Strap - 2 x daily - 7 x weekly - 3-5 reps - 30 seconds hold  Supine Bridge - 2 x daily - 7 x weekly - 10 reps - 3-5 seconds hold  Supine Isometric Hamstring Set - 2 x daily - 7 x weekly - 10 reps - 5seconds hold  Tandem Stance - 2 x daily - 7 x weekly - 1 sets - 5 reps - 20 second hold  Standing Hip Abduction with Counter Support - 1 x daily - 7 x weekly - 15 reps  Standing Hip Extension with Counter Support - 1 x daily - 7 x weekly - 15 reps  Heel Toe Raises with Counter Support - 1 x daily - 7 x weekly - 10 reps  Heel rises with counter support - 1 x daily - 7 x weekly - 10 reps    Consulted and Agree with Plan of Care Patient             Patient will benefit from skilled therapeutic intervention in order to improve the following deficits and impairments:  Abnormal gait, Decreased activity tolerance, Decreased balance, Decreased endurance, Decreased strength, Difficulty walking, Increased edema, Impaired flexibility, Postural dysfunction, Improper body mechanics, Pain  Visit Diagnosis: Difficulty walking  Muscle weakness (generalized)  Unsteadiness on feet     Problem  List Patient Active Problem List   Diagnosis Date Noted   HYPERLIPIDEMIA 04/18/2007   ALLERGIC RHINITIS 04/18/2007   PULMONARY NODULE 04/18/2007   CARCINOMA, RECTUM 04/17/2007   ANXIETY 04/17/2007   HYPERTENSION 04/17/2007   COLITIS, ULCERATIVE 04/17/2007    Farley Ly PT, MPT 07/09/2020, 3:33 PM  [facility] Physical Therapy [address] [place], [place], [address] Phone: [phone]   Fax:  [phone]  Name: SHANIECE BUSSA MRN: [id number] Date of Birth: [birth year]

## 2020-07-14 DIAGNOSIS — L570 Actinic keratosis: Secondary | ICD-10-CM | POA: Diagnosis not present

## 2020-07-14 DIAGNOSIS — Z08 Encounter for follow-up examination after completed treatment for malignant neoplasm: Secondary | ICD-10-CM | POA: Diagnosis not present

## 2020-07-14 DIAGNOSIS — X32XXXD Exposure to sunlight, subsequent encounter: Secondary | ICD-10-CM | POA: Diagnosis not present

## 2020-07-14 DIAGNOSIS — Z85828 Personal history of other malignant neoplasm of skin: Secondary | ICD-10-CM | POA: Diagnosis not present

## 2020-07-23 DIAGNOSIS — N1831 Chronic kidney disease, stage 3a: Secondary | ICD-10-CM | POA: Diagnosis not present

## 2020-07-23 DIAGNOSIS — E041 Nontoxic single thyroid nodule: Secondary | ICD-10-CM | POA: Diagnosis not present

## 2020-07-23 DIAGNOSIS — I129 Hypertensive chronic kidney disease with stage 1 through stage 4 chronic kidney disease, or unspecified chronic kidney disease: Secondary | ICD-10-CM | POA: Diagnosis not present

## 2020-07-23 DIAGNOSIS — E222 Syndrome of inappropriate secretion of antidiuretic hormone: Secondary | ICD-10-CM | POA: Diagnosis not present

## 2020-07-23 DIAGNOSIS — F411 Generalized anxiety disorder: Secondary | ICD-10-CM | POA: Diagnosis not present

## 2020-07-25 ENCOUNTER — Encounter: Payer: Self-pay | Admitting: Rehabilitative and Restorative Service Providers"

## 2020-07-25 ENCOUNTER — Ambulatory Visit (INDEPENDENT_AMBULATORY_CARE_PROVIDER_SITE_OTHER): Payer: Medicare Other | Admitting: Rehabilitative and Restorative Service Providers"

## 2020-07-25 ENCOUNTER — Other Ambulatory Visit: Payer: Self-pay

## 2020-07-25 DIAGNOSIS — M6281 Muscle weakness (generalized): Secondary | ICD-10-CM

## 2020-07-25 DIAGNOSIS — R2681 Unsteadiness on feet: Secondary | ICD-10-CM | POA: Diagnosis not present

## 2020-07-25 DIAGNOSIS — R262 Difficulty in walking, not elsewhere classified: Secondary | ICD-10-CM | POA: Diagnosis not present

## 2020-07-25 DIAGNOSIS — R293 Abnormal posture: Secondary | ICD-10-CM

## 2020-07-25 NOTE — Therapy (Addendum)
[facility] [address] [place], [place], [address] Phone: [phone]   Fax:  [phone]  Physical Therapy Treatment/Re-certification Progress Note Reporting Period 05/29/2020 to 07/25/2020  See note below for Objective Data and Assessment of Progress/Goals.      Patient Details  Name: Sandy Trujillo MRN: [id number] Date of Birth: 12-Feb-1932 Referring Provider (PT): Mcarthur Rossetti MD   Encounter Date: 07/25/2020   PT End of Session - 07/25/20 1808     Visit Number 9    Number of Visits 17    PT Start Time 3545    PT Stop Time 1100    PT Time Calculation (min) 45 min    Equipment Utilized During Treatment Gait belt    Activity Tolerance Patient tolerated treatment well;No increased pain;Patient limited by fatigue    Behavior During Therapy [facility] for tasks assessed/performed             Past Medical History:  Diagnosis Date   Hyperparathyroidism ([place]) 2019   Ileostomy in place [facility])    Osteoporosis 2019   rectal ca 2007   surg only    Past Surgical History:  Procedure Laterality Date   ABDOMINAL HYSTERECTOMY     BREAST EXCISIONAL BIOPSY Bilateral    benign   COLON SURGERY  2007    There were no vitals filed for this visit.   Subjective Assessment - 07/25/20 1039     Subjective Sandy Trujillo says she is not having much pain but she has a fear of falls.    Pertinent History HTN, previous lumbar decompression L4-5 in August 2020, HTN    Limitations House hold activities;Lifting;Walking    How long can you sit comfortably? Not limited, just doesn't sit too long    How long can you stand comfortably? 30-60 minutes    How long can you walk comfortably? Has not tried > 10-15 minutes but OK with this    Patient Stated Goals Return to working out at the gym and be able to walk normally without hamstrings discomfort.    Currently in Pain? Yes    Pain Score 1     Pain Location Hip    Pain Orientation Right    Pain Descriptors  / Indicators Sore    Pain Type Acute pain    Pain Radiating Towards NA    Pain Onset More than a month ago    Pain Frequency Occasional    Aggravating Factors  Balance is the biggest issue-confidence is lacking    Pain Relieving Factors Exercises    Effect of Pain on Daily Activities Apprehension and limited endurance    Multiple Pain Sites No                               [facility] Adult PT Treatment/Exercise - 07/25/20 0001       Posture/Postural Control   Posture/Postural Control Postural limitations    Postural Limitations Forward head;Rounded Shoulders;Decreased lumbar lordosis      Neuro Re-Sandy    Neuro Re-Sandy Details  Tandem balance static and dynamic, heel to toe raises without UE, dynamic balance and tap-ups on 6 inch step      Exercises   Exercises Knee/Hip      Knee/Hip Exercises: Standing   Heel Raises Both;1 set;15 reps;3 seconds;Limitations    Heel Raises Limitations Heel and toe raises    Other Standing Knee Exercises Standing trunk extension AROM 3 sets of  5X 3 seconds    Other Standing Knee Exercises Shoulder blade pinches 3 sets of 5 for 5 seconds      Knee/Hip Exercises: Seated   Sit to Sand 2 sets;5 reps;without UE support                    PT Education - 07/25/20 1806     Education Details Reviewed HEP and updated as needed.    Person(s) Educated Patient    Methods Explanation;Demonstration;Verbal cues;Handout    Comprehension Verbalized understanding;Returned demonstration;Verbal cues required;Need further instruction              PT Short Term Goals - 07/25/20 1807       PT SHORT TERM GOAL #1   Title Sandy Trujillo will be independent with her day 1 HEP.    Status Achieved      PT SHORT TERM GOAL #2   Title Patient will report 2/10 pain at worst with walking    Status Achieved      PT SHORT TERM GOAL #3   Title Patient will be indepedent with modified exercise program for her hip    Status Achieved                PT Long Term Goals - 07/25/20 1807       PT LONG TERM GOAL #1   Title Improve FOTO to 65.    Baseline 56 at 06/26/2020 RA (was 47 at eval)    Time 4    Period Weeks    Status On-going      PT LONG TERM GOAL #2   Title Sandy Trujillo will report B posterior thigh pain consistently <3/10 on the Numeric Pain Rating Scale.    Baseline 0-2/10    Status Achieved      PT LONG TERM GOAL #3   Title Sandy Trujillo will be able to walk 1/4 mile or more without apprehension and without an assistive device.    Baseline Needs cane.  Apprehension and endurance issues.    Time 4    Period Weeks    Status On-going      PT LONG TERM GOAL #4   Title Sandy Trujillo will be independent with her long-term HEP.    Status On-going      PT LONG TERM GOAL #5   Title She will improve FGA to at least 22/30 to show improved balance    Status On-going                   Plan - 07/25/20 1809     Clinical Impression Statement Sandy Trujillo has met most long-term goals related to her hip/thigh pain.  She is apprehensive about walking and has a fear of falling.  FOTO score reflects this and that she is frequently getting flared-up from working with a Physiological scientist who also gave [employer].  I have suggested to Sandy Trujillo that she fire her personal trainer and focus on her home exercises which are addressing her hip impairments, balance and general conditioning.  Sandy Trujillo will also benefit from an additional (up to) 8 PT visits to improve balance, reduce falls risk and prepare for confident transfer into independent rehabilitation.    Personal Factors and Comorbidities Age;Comorbidity 3+    Comorbidities Previous L4-5 decompression, R leg weakness, HTN    Examination-Activity Limitations Bed Mobility;Bend;Lift;Squat;Stairs;Locomotion Level    Examination-Participation Restrictions Community Activity    Stability/Clinical Decision Making Stable/Uncomplicated    Rehab Potential Good    PT  Frequency 2x / week    PT Duration 4 weeks     PT Treatment/Interventions ADLs/Self Care Home Management;Cryotherapy;Therapeutic activities;Stair training;Gait training;Therapeutic exercise;Balance training;Neuromuscular re-education;Patient/family education;Manual techniques    PT Next Visit Plan Balance, LE strength and proprioception    PT Home Exercise Plan Access Code: [email or web address]  URL: [email or web address]  Date: 06/24/2020  Prepared by: Kearney Hard    Exercises  Standing Lumbar Extension at [place] - 5 x daily - 7 x weekly - 1 sets - 5 reps - 3 seconds hold  Standing Scapular Retraction - 5 x daily - 7 x weekly - 1 sets - 5 reps - 5 second hold  Sit to Stand with Armchair - 2 x daily - 7 x weekly - 1 sets - 5 reps  Seated Hamstring Stretch with Strap - 2 x daily - 7 x weekly - 3-5 reps - 30 seconds hold  Supine Bridge - 2 x daily - 7 x weekly - 10 reps - 3-5 seconds hold  Supine Isometric Hamstring Set - 2 x daily - 7 x weekly - 10 reps - 5seconds hold  Tandem Stance - 2 x daily - 7 x weekly - 1 sets - 5 reps - 20 second hold  Standing Hip Abduction with Counter Support - 1 x daily - 7 x weekly - 15 reps  Standing Hip Extension with Counter Support - 1 x daily - 7 x weekly - 15 reps  Heel Toe Raises with Counter Support - 1 x daily - 7 x weekly - 10 reps  Heel rises with counter support - 1 x daily - 7 x weekly - 10 reps    Consulted and Agree with Plan of Care Patient             Patient will benefit from skilled therapeutic intervention in order to improve the following deficits and impairments:  Abnormal gait, Decreased activity tolerance, Decreased balance, Decreased endurance, Decreased strength, Difficulty walking, Increased edema, Impaired flexibility, Postural dysfunction, Improper body mechanics, Pain  Visit Diagnosis: Difficulty walking  Unsteadiness on feet  Abnormal posture  Muscle weakness (generalized)     Problem List Patient Active Problem List   Diagnosis Date Noted   HYPERLIPIDEMIA  04/18/2007   ALLERGIC RHINITIS 04/18/2007   PULMONARY NODULE 04/18/2007   CARCINOMA, RECTUM 04/17/2007   ANXIETY 04/17/2007   HYPERTENSION 04/17/2007   COLITIS, ULCERATIVE 04/17/2007    Farley Ly PT, MPT 07/25/2020, 6:13 PM  [facility] [address] [place], [place], [address] Phone: [phone]   Fax:  [phone]  Name: AYAH COZZOLINO MRN: [id number] Date of Birth: August 16, 1932

## 2020-07-25 NOTE — Patient Instructions (Signed)
Access Code: [email or web address] URL: [email or web address] Date: 07/25/2020 Prepared by: Vista Mink  Exercises Standing Lumbar Extension at [place] 5 x daily - 7 x weekly - 1 sets - 3-5 reps - 3 seconds hold Standing Scapular Retraction - 5 x daily - 7 x weekly - 1 sets - 3-5 reps - 5 second hold Sit to Stand with Armchair - 1-2 x daily - 7 x weekly - 1 sets - 5 reps Tandem Stance - 1-2 x daily - 7 x weekly - 1 sets - 5 reps - 20 second hold Heel Toe Raises with Counter Support - 1-2 x daily - 7 x weekly - 10 reps

## 2020-07-30 ENCOUNTER — Encounter: Payer: Self-pay | Admitting: Rehabilitative and Restorative Service Providers"

## 2020-07-30 ENCOUNTER — Other Ambulatory Visit: Payer: Self-pay

## 2020-07-30 ENCOUNTER — Ambulatory Visit (INDEPENDENT_AMBULATORY_CARE_PROVIDER_SITE_OTHER): Payer: Medicare Other | Admitting: Rehabilitative and Restorative Service Providers"

## 2020-07-30 DIAGNOSIS — R262 Difficulty in walking, not elsewhere classified: Secondary | ICD-10-CM | POA: Diagnosis not present

## 2020-07-30 DIAGNOSIS — M6281 Muscle weakness (generalized): Secondary | ICD-10-CM | POA: Diagnosis not present

## 2020-07-30 DIAGNOSIS — R2681 Unsteadiness on feet: Secondary | ICD-10-CM

## 2020-07-30 DIAGNOSIS — R293 Abnormal posture: Secondary | ICD-10-CM

## 2020-07-30 NOTE — Therapy (Signed)
[facility] [address] [place], [place], [address] Phone: [phone]   Fax:  [phone]  Physical Therapy Treatment  Patient Details  Name: Sandy Trujillo MRN: [id number] Date of Birth: [birth year] Referring Provider (PT): Mcarthur Rossetti MD   Encounter Date: 07/30/2020   PT End of Session - 07/30/20 1349     Visit Number 10    Number of Visits 17    PT Start Time 1301    PT Stop Time 1341    PT Time Calculation (min) 40 min    Equipment Utilized During Treatment Gait belt    Activity Tolerance Patient tolerated treatment well;No increased pain;Patient limited by fatigue    Behavior During Therapy [facility] for tasks assessed/performed             Past Medical History:  Diagnosis Date   Hyperparathyroidism ([place]) 2019   Ileostomy in place [facility])    Osteoporosis 2019   rectal ca 2007   surg only    Past Surgical History:  Procedure Laterality Date   ABDOMINAL HYSTERECTOMY     BREAST EXCISIONAL BIOPSY Bilateral    benign   COLON SURGERY  2007    There were no vitals filed for this visit.   Subjective Assessment - 07/30/20 1343     Subjective Pattijo showed up to PT without her cane today.  She reports using it less and less at home.    Pertinent History HTN, previous lumbar decompression L4-5 in August 2020, HTN    Limitations House hold activities;Lifting;Walking    How long can you sit comfortably? Not limited, just doesn't sit too long    How long can you stand comfortably? 30-60 minutes    How long can you walk comfortably? Has not tried > 10-15 minutes but OK with this    Patient Stated Goals Return to working out at the gym and be able to walk normally without hamstrings discomfort.    Currently in Pain? No/denies    Pain Score 0-No pain    Pain Onset More than a month ago    Pain Frequency Occasional    Multiple Pain Sites No                               [facility] Adult PT  Treatment/Exercise - 07/30/20 0001       Posture/Postural Control   Posture/Postural Control Postural limitations    Postural Limitations Forward head;Rounded Shoulders;Decreased lumbar lordosis      Therapeutic Activites    Therapeutic Activities ADL's    ADL's Step-up and over 8 inch step slow eccentrics and long strides with gait (10 seconds to walk 30 feet)      Neuro Re-ed    Neuro Re-ed Details  Dynamic heel to toe balance forward and backward      Exercises   Exercises Knee/Hip      Knee/Hip Exercises: Machines for Strengthening   Cybex Leg Press 50# 15X slow eccentrics and single leg 18# 10X slow eccentrics      Knee/Hip Exercises: Standing   Heel Raises Both;1 set;15 reps;3 seconds;Limitations    Heel Raises Limitations Heel and toe raises with and without hands    Other Standing Knee Exercises Standing trunk extension AROM 3 sets of 5X 3 seconds    Other Standing Knee Exercises Shoulder blade pinches 3 sets of 5 for 5 seconds      Knee/Hip Exercises: Seated  Sit to Sand 2 sets;5 reps;without UE support                    PT Education - 07/30/20 1348     Education Details Reviewed HEP with progressions to strength activities.    Person(s) Educated Patient    Methods Explanation;Demonstration;Verbal cues    Comprehension Returned demonstration;Verbalized understanding;Verbal cues required              PT Short Term Goals - 07/25/20 1807       PT SHORT TERM GOAL #1   Title Yarely will be independent with her day 1 HEP.    Status Achieved      PT SHORT TERM GOAL #2   Title Patient will report 2/10 pain at worst with walking    Status Achieved      PT SHORT TERM GOAL #3   Title Patient will be indepedent with modified exercise program for her hip    Status Achieved               PT Long Term Goals - 07/30/20 1348       PT LONG TERM GOAL #1   Title Improve FOTO to 65.    Baseline 56 at 06/26/2020 RA (was 49 at eval)    Time 4     Period Weeks    Status On-going      PT LONG TERM GOAL #2   Title Deshea will report B posterior thigh pain consistently <3/10 on the Numeric Pain Rating Scale.    Baseline 0-2/10    Status Achieved      PT LONG TERM GOAL #3   Title Tennelle will be able to walk 1/4 mile or more without apprehension and without an assistive device.    Baseline Needs cane.  Apprehension and endurance issues.    Time 4    Period Weeks    Status On-going      PT LONG TERM GOAL #4   Title Karista will be independent with her long-term HEP.    Status On-going      PT LONG TERM GOAL #5   Title She will improve FGA to at least 22/30 to show improved balance    Status On-going                   Plan - 07/30/20 [place] attended PT today without her cane.  She reports she is using it less and less at home.  Balance, leg strength and gait/balance specific drills remain the focus of Madilynn's PT.  She is appropriate for another Berg test next week.    Personal Factors and Comorbidities Age;Comorbidity 3+    Comorbidities Previous L4-5 decompression, R leg weakness, HTN    Examination-Activity Limitations Bed Mobility;Bend;Lift;Squat;Stairs;Locomotion Level    Examination-Participation Restrictions Community Activity    Stability/Clinical Decision Making Stable/Uncomplicated    Rehab Potential Good    PT Frequency 2x / week    PT Duration 4 weeks    PT Treatment/Interventions ADLs/Self Care Home Management;Cryotherapy;Therapeutic activities;Stair training;Gait training;Therapeutic exercise;Balance training;Neuromuscular re-education;Patient/family education;Manual techniques    PT Next Visit Plan Balance, LE strength and proprioception    PT Home Exercise Plan Access Code: [email or web address]  URL: [email or web address]  Date: 06/24/2020  Prepared by: Kearney Hard    Exercises  Standing Lumbar Extension at [place] 5 x daily - 7 x weekly - 1 sets - 5 reps  -  3 seconds hold  Standing Scapular Retraction - 5 x daily - 7 x weekly - 1 sets - 5 reps - 5 second hold  Sit to Stand with Armchair - 2 x daily - 7 x weekly - 1 sets - 5 reps  Seated Hamstring Stretch with Strap - 2 x daily - 7 x weekly - 3-5 reps - 30 seconds hold  Supine Bridge - 2 x daily - 7 x weekly - 10 reps - 3-5 seconds hold  Supine Isometric Hamstring Set - 2 x daily - 7 x weekly - 10 reps - 5seconds hold  Tandem Stance - 2 x daily - 7 x weekly - 1 sets - 5 reps - 20 second hold  Standing Hip Abduction with Counter Support - 1 x daily - 7 x weekly - 15 reps  Standing Hip Extension with Counter Support - 1 x daily - 7 x weekly - 15 reps  Heel Toe Raises with Counter Support - 1 x daily - 7 x weekly - 10 reps  Heel rises with counter support - 1 x daily - 7 x weekly - 10 reps    Consulted and Agree with Plan of Care Patient             Patient will benefit from skilled therapeutic intervention in order to improve the following deficits and impairments:  Abnormal gait, Decreased activity tolerance, Decreased balance, Decreased endurance, Decreased strength, Difficulty walking, Increased edema, Impaired flexibility, Postural dysfunction, Improper body mechanics, Pain  Visit Diagnosis: Difficulty walking  Unsteadiness on feet  Abnormal posture  Muscle weakness (generalized)     Problem List Patient Active Problem List   Diagnosis Date Noted   HYPERLIPIDEMIA 04/18/2007   ALLERGIC RHINITIS 04/18/2007   PULMONARY NODULE 04/18/2007   CARCINOMA, RECTUM 04/17/2007   ANXIETY 04/17/2007   HYPERTENSION 04/17/2007   COLITIS, ULCERATIVE 04/17/2007    Farley Ly PT, MPT 07/30/2020, 1:52 PM  [facility] [address] [place], [place], [address] Phone: [phone]   Fax:  [phone]  Name: ADREANNA FICKEL MRN: [id number] Date of Birth: Jun 30, 1932

## 2020-08-01 DIAGNOSIS — E041 Nontoxic single thyroid nodule: Secondary | ICD-10-CM | POA: Diagnosis not present

## 2020-08-01 DIAGNOSIS — E042 Nontoxic multinodular goiter: Secondary | ICD-10-CM | POA: Diagnosis not present

## 2020-08-06 ENCOUNTER — Encounter: Payer: Self-pay | Admitting: Rehabilitative and Restorative Service Providers"

## 2020-08-06 ENCOUNTER — Other Ambulatory Visit: Payer: Self-pay

## 2020-08-06 ENCOUNTER — Ambulatory Visit (INDEPENDENT_AMBULATORY_CARE_PROVIDER_SITE_OTHER): Payer: Medicare Other | Admitting: Rehabilitative and Restorative Service Providers"

## 2020-08-06 DIAGNOSIS — M6281 Muscle weakness (generalized): Secondary | ICD-10-CM | POA: Diagnosis not present

## 2020-08-06 DIAGNOSIS — R2681 Unsteadiness on feet: Secondary | ICD-10-CM

## 2020-08-06 DIAGNOSIS — R262 Difficulty in walking, not elsewhere classified: Secondary | ICD-10-CM | POA: Diagnosis not present

## 2020-08-06 DIAGNOSIS — R293 Abnormal posture: Secondary | ICD-10-CM

## 2020-08-06 NOTE — Therapy (Signed)
[facility] Physical Therapy [address] [place], [place], [address] Phone: [phone]   Fax:  [phone]  Physical Therapy Treatment  Patient Details  Name: Sandy Trujillo MRN: [id number] Date of Birth: 1933/01/24 Referring Provider (PT): Mcarthur Rossetti MD   Encounter Date: 08/06/2020   PT End of Session - 08/06/20 1355     Visit Number 11    Number of Visits 17    Date for PT Re-Evaluation 09/19/20    PT Start Time 8756    PT Stop Time 1345    PT Time Calculation (min) 42 min    Equipment Utilized During Treatment Gait belt    Activity Tolerance Patient tolerated treatment well;No increased pain    Behavior During Therapy [facility] for tasks assessed/performed             Past Medical History:  Diagnosis Date   Hyperparathyroidism ([place]) 2019   Ileostomy in place [facility])    Osteoporosis 2019   rectal ca 2007   surg only    Past Surgical History:  Procedure Laterality Date   ABDOMINAL HYSTERECTOMY     BREAST EXCISIONAL BIOPSY Bilateral    benign   COLON SURGERY  2007    There were no vitals filed for this visit.   Subjective Assessment - 08/06/20 1349     Subjective Sandy Trujillo reports Sandy Trujillo has not used her cane in almost 2 weeks.  Sandy Trujillo reports taking longer strides but reverts back to a shuffling gait when not "paying attention."    Pertinent History HTN, previous lumbar decompression L4-5 in August 2020, HTN    Limitations House hold activities;Lifting;Walking    How long can you sit comfortably? Not limited, just doesn't sit too long    How long can you stand comfortably? 30-60 minutes    How long can you walk comfortably? Has not tried > 10-15 minutes but OK with this    Patient Stated Goals Return to working out at the gym and be able to walk normally without hamstrings discomfort.    Currently in Pain? Yes    Pain Score 1     Pain Location Hip    Pain Orientation Right    Pain Descriptors / Indicators Dull    Pain Type Acute  pain    Pain Radiating Towards NA    Pain Onset More than a month ago    Pain Frequency Occasional    Aggravating Factors  Stairs without a handrail bothers her R knee and Sandy Trujillo lacks confidence    Pain Relieving Factors Exercises    Effect of Pain on Daily Activities Apprehension and limited endurance    Multiple Pain Sites No                               [facility] Adult PT Treatment/Exercise - 08/06/20 0001       Posture/Postural Control   Posture/Postural Control Postural limitations    Postural Limitations Forward head;Rounded Shoulders;Decreased lumbar lordosis      Therapeutic Activites    Therapeutic Activities ADL's    ADL's Step-up and over 8 inch step with hands and 6 inch step without hands slow eccentrics and long strides with gait (10 seconds to walk 40 feet and forward/retro walking 40 feet in 20 seconds)      Neuro Re-ed    Neuro Re-ed Details  Dynamic heel to toe balance forward and backward      Exercises  Exercises Knee/Hip      Knee/Hip Exercises: Machines for Strengthening   Cybex Leg Press 50# 15X slow eccentrics and single leg 18# 10X slow eccentrics      Knee/Hip Exercises: Standing   Heel Raises Both;1 set;15 reps;3 seconds;Limitations    Heel Raises Limitations Heel and toe raises with and without hands    Other Standing Knee Exercises Standing trunk extension AROM 3 sets of 5X 3 seconds    Other Standing Knee Exercises Shoulder blade pinches 3 sets of 5 for 5 seconds      Knee/Hip Exercises: Seated   Sit to Sand 2 sets;5 reps;without UE support                    PT Education - 08/06/20 1354     Education Details Reviewed HEP with continued emphasis on long strides with gait.    Person(s) Educated Patient    Methods Explanation;Demonstration;Verbal cues    Comprehension Verbalized understanding;Need further instruction;Returned demonstration;Verbal cues required              PT Short Term Goals - 07/25/20 1807        PT SHORT TERM GOAL #1   Title Sandy Trujillo will be independent with her day 1 HEP.    Status Achieved      PT SHORT TERM GOAL #2   Title Patient will report 2/10 pain at worst with walking    Status Achieved      PT SHORT TERM GOAL #3   Title Patient will be indepedent with modified exercise program for her hip    Status Achieved               PT Long Term Goals - 08/06/20 1354       PT LONG TERM GOAL #1   Title Improve FOTO to 65.    Baseline 56 at 06/26/2020 RA (was 47 at eval)    Time 2    Period Weeks    Status On-going    Target Date 08/22/20      PT LONG TERM GOAL #2   Title Sandy Trujillo will report B posterior thigh pain consistently <3/10 on the Numeric Pain Rating Scale.    Baseline 0-1/10    Status Achieved      PT LONG TERM GOAL #3   Title Sandy Trujillo will be able to walk 1/4 mile or more without apprehension and without an assistive device.    Baseline No longer needs a cane, close on distance.    Time 2    Period Weeks    Status Partially Met    Target Date 08/22/20      PT LONG TERM GOAL #4   Title Sandy Trujillo will be independent with her long-term HEP.    Time 2    Status On-going    Target Date 08/22/20      PT LONG TERM GOAL #5   Title Sandy Trujillo will improve FGA to at least 22/30 to show improved balance    Time 4    Status On-going    Target Date 08/22/20                   Plan - 08/06/20 1358     Clinical Impression Statement Sandy Trujillo is taking longer strides with gait and Sandy Trujillo has not used her cane in almost 2 weeks.  Sandy Trujillo is reporting more confidence with gait and looks better with balance and functional activities in the clinic.  Sandy Trujillo has a  beach trip next week and will return for reassessment the last week of July for reassessment and possible transfer into independent rehabilitation.    Personal Factors and Comorbidities Age;Comorbidity 3+    Comorbidities Previous L4-5 decompression, R leg weakness, HTN    Examination-Activity Limitations Bed  Mobility;Bend;Lift;Squat;Stairs;Locomotion Level    Examination-Participation Restrictions Community Activity    Stability/Clinical Decision Making Stable/Uncomplicated    Rehab Potential Good    PT Frequency 2x / week    PT Duration 4 weeks    PT Treatment/Interventions ADLs/Self Care Home Management;Cryotherapy;Therapeutic activities;Stair training;Gait training;Therapeutic exercise;Balance training;Neuromuscular re-education;Patient/family education;Manual techniques    PT Next Visit Plan Balance, LE strength and proprioception    PT Home Exercise Plan Access Code: [email or web address]  URL: [email or web address]  Date: 06/24/2020  Prepared by: Sandy Trujillo    Exercises  Standing Lumbar Extension at [place] - 5 x daily - 7 x weekly - 1 sets - 5 reps - 3 seconds hold  Standing Scapular Retraction - 5 x daily - 7 x weekly - 1 sets - 5 reps - 5 second hold  Sit to Stand with Armchair - 2 x daily - 7 x weekly - 1 sets - 5 reps  Seated Hamstring Stretch with Strap - 2 x daily - 7 x weekly - 3-5 reps - 30 seconds hold  Supine Bridge - 2 x daily - 7 x weekly - 10 reps - 3-5 seconds hold  Supine Isometric Hamstring Set - 2 x daily - 7 x weekly - 10 reps - 5seconds hold  Tandem Stance - 2 x daily - 7 x weekly - 1 sets - 5 reps - 20 second hold  Standing Hip Abduction with Counter Support - 1 x daily - 7 x weekly - 15 reps  Standing Hip Extension with Counter Support - 1 x daily - 7 x weekly - 15 reps  Heel Toe Raises with Counter Support - 1 x daily - 7 x weekly - 10 reps  Heel rises with counter support - 1 x daily - 7 x weekly - 10 reps    Consulted and Agree with Plan of Care Patient             Patient will benefit from skilled therapeutic intervention in order to improve the following deficits and impairments:  Abnormal gait, Decreased activity tolerance, Decreased balance, Decreased endurance, Decreased strength, Difficulty walking, Increased edema, Impaired flexibility, Postural  dysfunction, Improper body mechanics, Pain  Visit Diagnosis: Difficulty walking  Unsteadiness on feet  Abnormal posture  Muscle weakness (generalized)     Problem List Patient Active Problem List   Diagnosis Date Noted   HYPERLIPIDEMIA 04/18/2007   ALLERGIC RHINITIS 04/18/2007   PULMONARY NODULE 04/18/2007   CARCINOMA, RECTUM 04/17/2007   ANXIETY 04/17/2007   HYPERTENSION 04/17/2007   COLITIS, ULCERATIVE 04/17/2007    Sandy Trujillo PT, MPT 08/06/2020, 2:01 PM  [facility] Physical Therapy [address] [place], [place], [address] Phone: [phone]   Fax:  [phone]  Name: Sandy Trujillo MRN: [id number] Date of Birth: December 20, 1932

## 2020-08-15 ENCOUNTER — Encounter: Payer: Medicare Other | Admitting: Rehabilitative and Restorative Service Providers"

## 2020-08-19 DIAGNOSIS — I10 Essential (primary) hypertension: Secondary | ICD-10-CM | POA: Diagnosis not present

## 2020-08-22 ENCOUNTER — Ambulatory Visit (INDEPENDENT_AMBULATORY_CARE_PROVIDER_SITE_OTHER): Payer: Medicare Other | Admitting: Rehabilitative and Restorative Service Providers"

## 2020-08-22 ENCOUNTER — Encounter: Payer: Self-pay | Admitting: Rehabilitative and Restorative Service Providers"

## 2020-08-22 ENCOUNTER — Other Ambulatory Visit: Payer: Self-pay

## 2020-08-22 DIAGNOSIS — R293 Abnormal posture: Secondary | ICD-10-CM

## 2020-08-22 DIAGNOSIS — M6281 Muscle weakness (generalized): Secondary | ICD-10-CM | POA: Diagnosis not present

## 2020-08-22 DIAGNOSIS — R2681 Unsteadiness on feet: Secondary | ICD-10-CM | POA: Diagnosis not present

## 2020-08-22 DIAGNOSIS — R262 Difficulty in walking, not elsewhere classified: Secondary | ICD-10-CM | POA: Diagnosis not present

## 2020-08-22 NOTE — Patient Instructions (Signed)
Access Code: [email or web address] URL: [email or web address] Date: 08/22/2020 Prepared by: Vista Mink  Exercises Standing Lumbar Extension at [place] 5 x daily - 7 x weekly - 1 sets - 3-5 reps - 3 seconds hold Standing Scapular Retraction - 5 x daily - 7 x weekly - 1 sets - 3-5 reps - 5 second hold Sit to Stand with Armchair - 1-2 x daily - 7 x weekly - 1 sets - 5 reps Tandem Stance - 1-2 x daily - 7 x weekly - 1 sets - 5 reps - 20 second hold Heel Toe Raises with Counter Support - 1-2 x daily - 7 x weekly - 10 reps

## 2020-08-22 NOTE — Therapy (Signed)
[facility] Physical Therapy [address] [place], [place], [address] Phone: [phone]   Fax:  [phone]  Physical Therapy Treatment/Reassessment  Patient Details  Name: CHASTELYN RENNARD MRN: [id number] Date of Birth: November 01, 1932 Referring Provider (PT): Mcarthur Rossetti MD   Encounter Date: 08/22/2020   PT End of Session - 08/22/20 1701     Visit Number 12    Number of Visits 17    Date for PT Re-Evaluation 09/19/20    PT Start Time 1100    PT Stop Time 1145    PT Time Calculation (min) 45 min    Equipment Utilized During Treatment Gait belt    Activity Tolerance Patient tolerated treatment well;No increased pain;Patient limited by fatigue    Behavior During Therapy [facility] for tasks assessed/performed             Past Medical History:  Diagnosis Date   Hyperparathyroidism ([place]) 2019   Ileostomy in place [facility])    Osteoporosis 2019   rectal ca 2007   surg only    Past Surgical History:  Procedure Laterality Date   ABDOMINAL HYSTERECTOMY     BREAST EXCISIONAL BIOPSY Bilateral    benign   COLON SURGERY  2007    There were no vitals filed for this visit.   Subjective Assessment - 08/22/20 1657     Subjective Aureliana has not used her cane in a month.  She does report poor HEP compliance while at the beach with her family.    Pertinent History HTN, previous lumbar decompression L4-5 in August 2020, HTN    Limitations House hold activities;Lifting;Walking    How long can you sit comfortably? Not limited, just doesn't sit too long    How long can you stand comfortably? 30-60 minutes    How long can you walk comfortably? Has not tried > 10-15 minutes but OK with this    Patient Stated Goals Return to working out at the gym and be able to walk normally without hamstrings discomfort.    Currently in Pain? No/denies    Pain Score 0-No pain    Pain Location Hip    Pain Radiating Towards NA    Pain Onset More than a month ago    Pain  Frequency Rarely    Aggravating Factors  Stairs without a handrail is difficult due to quadriceps weakness and apprension/balance    Pain Relieving Factors Exercises    Effect of Pain on Daily Activities Uncomfortable with stairs without a handrail and WB endurance    Multiple Pain Sites No                [facility] PT Assessment - 08/22/20 0001       Berg Balance Test   Sit to Stand Able to stand without using hands and stabilize independently    Standing Unsupported Able to stand safely 2 minutes    Sitting with Back Unsupported but Feet Supported on Floor or Stool Able to sit safely and securely 2 minutes    Stand to Sit Sits safely with minimal use of hands    Transfers Able to transfer safely, minor use of hands    Standing Unsupported with Eyes Closed Able to stand 10 seconds safely    Standing Unsupported with Feet Together Able to place feet together independently and stand 1 minute safely    From Standing, Reach Forward with Outstretched Arm Reaches forward but needs supervision    From Standing Position, Pick up Object from Floor  Able to pick up shoe, needs supervision    From Standing Position, Turn to Look Behind Over each Shoulder Looks behind one side only/other side shows less weight shift    Turn 360 Degrees Able to turn 360 degrees safely in 4 seconds or less    Standing Unsupported, Alternately Place Feet on Step/Stool Able to stand independently and safely and complete 8 steps in 20 seconds    Standing Unsupported, One Foot in Front Able to take small step independently and hold 30 seconds    Standing on One Leg Tries to lift leg/unable to hold 3 seconds but remains standing independently    Total Score 46                           [facility] Adult PT Treatment/Exercise - 08/22/20 0001       Posture/Postural Control   Posture/Postural Control Postural limitations    Postural Limitations Forward head;Rounded Shoulders;Decreased lumbar lordosis       Therapeutic Activites    Therapeutic Activities ADL's    ADL's Step-up and over 6 and 8 inch step with and without hands but with contact guard assist      Neuro Re-ed    Neuro Re-ed Details  Dynamic heel to toe balance forward and backward      Exercises   Exercises Knee/Hip      Knee/Hip Exercises: Machines for Strengthening   Cybex Leg Press 62# 15X slow eccentrics and single leg 25# 10X slow eccentrics      Knee/Hip Exercises: Standing   Heel Raises Both;1 set;15 reps;3 seconds;Limitations    Heel Raises Limitations Heel and toe raises with and without hands    Other Standing Knee Exercises Standing trunk extension AROM 3 sets of 5X 3 seconds    Other Standing Knee Exercises Shoulder blade pinches 3 sets of 5 for 5 seconds      Knee/Hip Exercises: Seated   Sit to Sand 2 sets;5 reps;without UE support                    PT Education - 08/22/20 1659     Education Details Reviewed Merrilee Jansky Balance Score and HEP.    Person(s) Educated Patient    Methods Explanation;Verbal cues    Comprehension Verbal cues required;Returned demonstration;Verbalized understanding              PT Short Term Goals - 07/25/20 1807       PT SHORT TERM GOAL #1   Title Lucindia will be independent with her day 1 HEP.    Status Achieved      PT SHORT TERM GOAL #2   Title Patient will report 2/10 pain at worst with walking    Status Achieved      PT SHORT TERM GOAL #3   Title Patient will be indepedent with modified exercise program for her hip    Status Achieved               PT Long Term Goals - 08/22/20 1700       PT LONG TERM GOAL #1   Title Improve FOTO to 65.    Baseline 52 (was 47 at eval)    Time 4    Period Weeks    Status On-going    Target Date 09/19/20      PT LONG TERM GOAL #2   Title Phyllistine will report B posterior thigh pain consistently <3/10 on the  Numeric Pain Rating Scale.    Baseline 0-1/10    Status Achieved      PT LONG TERM GOAL #3   Title  Tamkia will be able to walk 1/4 mile or more without apprehension and without an assistive device.    Baseline No problem with 1/4 mile or less without a cane.    Period Weeks    Status Achieved      PT LONG TERM GOAL #4   Title Pauli will be independent with her long-term HEP.    Time 2    Status Achieved      PT LONG TERM GOAL #5   Title She will improve FGA to at least 22/30 to show improved balance    Time 4    Status On-going                   Plan - 08/22/20 1702     Clinical Impression Statement Arias has made excellent overall progress with her PT.  Her hip is rarely limited by pain and she no longer uses a cane.  Berg Balance Score is 46/56, which is above the 45/56 or below that requires an assistive device, but below the 49/56 that suggests an assistive device is not needed.  Shameya has a hard time with steps when there is no assistive device.  I believe she will meet her balance goal of 49/56 or better on the Berg with another 2-5 PT visits.    Personal Factors and Comorbidities Age;Comorbidity 3+    Comorbidities Previous L4-5 decompression, R leg weakness, HTN    Examination-Activity Limitations Bed Mobility;Bend;Lift;Squat;Stairs;Locomotion Level    Examination-Participation Restrictions Community Activity    Stability/Clinical Decision Making Stable/Uncomplicated    Rehab Potential Good    PT Frequency Other (comment)   2-5 visits over 4 weeks   PT Duration 4 weeks    PT Treatment/Interventions ADLs/Self Care Home Management;Cryotherapy;Therapeutic activities;Stair training;Gait training;Therapeutic exercise;Balance training;Neuromuscular re-education;Patient/family education;Manual techniques    PT Next Visit Plan Berg drills and leg strength    PT Home Exercise Plan Access Code: [email or web address]  URL: [email or web address]  Date: 06/24/2020  Prepared by: Kearney Hard    Exercises  Standing Lumbar Extension at [place] - 5 x daily - 7 x weekly - 1  sets - 5 reps - 3 seconds hold  Standing Scapular Retraction - 5 x daily - 7 x weekly - 1 sets - 5 reps - 5 second hold  Sit to Stand with Armchair - 2 x daily - 7 x weekly - 1 sets - 5 reps  Seated Hamstring Stretch with Strap - 2 x daily - 7 x weekly - 3-5 reps - 30 seconds hold  Supine Bridge - 2 x daily - 7 x weekly - 10 reps - 3-5 seconds hold  Supine Isometric Hamstring Set - 2 x daily - 7 x weekly - 10 reps - 5seconds hold  Tandem Stance - 2 x daily - 7 x weekly - 1 sets - 5 reps - 20 second hold  Standing Hip Abduction with Counter Support - 1 x daily - 7 x weekly - 15 reps  Standing Hip Extension with Counter Support - 1 x daily - 7 x weekly - 15 reps  Heel Toe Raises with Counter Support - 1 x daily - 7 x weekly - 10 reps  Heel rises with counter support - 1 x daily - 7 x weekly - 10 reps    Consulted and Agree  with Plan of Care Patient             Patient will benefit from skilled therapeutic intervention in order to improve the following deficits and impairments:  Abnormal gait, Decreased activity tolerance, Decreased balance, Decreased endurance, Decreased strength, Difficulty walking, Increased edema, Impaired flexibility, Postural dysfunction, Improper body mechanics, Pain  Visit Diagnosis: Difficulty walking  Unsteadiness on feet  Abnormal posture  Muscle weakness (generalized)     Problem List Patient Active Problem List   Diagnosis Date Noted   HYPERLIPIDEMIA 04/18/2007   ALLERGIC RHINITIS 04/18/2007   PULMONARY NODULE 04/18/2007   CARCINOMA, RECTUM 04/17/2007   ANXIETY 04/17/2007   HYPERTENSION 04/17/2007   COLITIS, ULCERATIVE 04/17/2007    Farley Ly PT, MPT 08/22/2020, 5:09 PM  [facility] Physical Therapy [address] [place], [place], [address] Phone: [phone]   Fax:  [phone]  Name: DELVINA WEAKS MRN: [id number] Date of Birth: July 19, 1932

## 2020-08-25 ENCOUNTER — Ambulatory Visit: Payer: Medicare Other

## 2020-08-28 ENCOUNTER — Inpatient Hospital Stay: Admission: RE | Admit: 2020-08-28 | Payer: Medicare Other | Source: Ambulatory Visit

## 2020-09-03 ENCOUNTER — Ambulatory Visit (INDEPENDENT_AMBULATORY_CARE_PROVIDER_SITE_OTHER): Payer: Medicare Other | Admitting: Rehabilitative and Restorative Service Providers"

## 2020-09-03 ENCOUNTER — Encounter: Payer: Self-pay | Admitting: Rehabilitative and Restorative Service Providers"

## 2020-09-03 ENCOUNTER — Other Ambulatory Visit: Payer: Self-pay

## 2020-09-03 DIAGNOSIS — M25552 Pain in left hip: Secondary | ICD-10-CM

## 2020-09-03 DIAGNOSIS — R2681 Unsteadiness on feet: Secondary | ICD-10-CM

## 2020-09-03 DIAGNOSIS — R293 Abnormal posture: Secondary | ICD-10-CM | POA: Diagnosis not present

## 2020-09-03 DIAGNOSIS — R262 Difficulty in walking, not elsewhere classified: Secondary | ICD-10-CM | POA: Diagnosis not present

## 2020-09-03 DIAGNOSIS — M6281 Muscle weakness (generalized): Secondary | ICD-10-CM | POA: Diagnosis not present

## 2020-09-03 NOTE — Therapy (Signed)
[facility] Physical Therapy [address] [place], [place], [address] Phone: [phone]   Fax:  [phone]  Physical Therapy Treatment  Patient Details  Name: Sandy Trujillo MRN: [id number] Date of Birth: 11-01-1932 Referring Provider (PT): Mcarthur Rossetti MD   Encounter Date: 09/03/2020   PT End of Session - 09/03/20 1149     Visit Number 13    Number of Visits 17    Date for PT Re-Evaluation 09/19/20    PT Start Time 1100    PT Stop Time 1140    PT Time Calculation (min) 40 min    Equipment Utilized During Treatment Gait belt    Activity Tolerance Patient tolerated treatment well;No increased pain;Patient limited by fatigue    Behavior During Therapy [facility] for tasks assessed/performed             Past Medical History:  Diagnosis Date   Hyperparathyroidism ([place]) 2019   Ileostomy in place [facility])    Osteoporosis 2019   rectal ca 2007   surg only    Past Surgical History:  Procedure Laterality Date   ABDOMINAL HYSTERECTOMY     BREAST EXCISIONAL BIOPSY Bilateral    benign   COLON SURGERY  2007    There were no vitals filed for this visit.   Subjective Assessment - 09/03/20 1145     Subjective Sandy Trujillo reports HEP compliance has improved.  She feels more confident with her gait and balance.  She has some L hamstrings soreness and still wants to work on stairs for increased independence.    Pertinent History HTN, previous lumbar decompression L4-5 in August 2020, HTN    Limitations House hold activities;Lifting;Walking    How long can you sit comfortably? Not limited, just doesn't sit too long    How long can you stand comfortably? 30-60 minutes    How long can you walk comfortably? Has not tried > 10-15 minutes but OK with this    Patient Stated Goals Return to working out at the gym and be able to walk normally without hamstrings discomfort.    Currently in Pain? Yes    Pain Score 2     Pain Location Hip    Pain Orientation Left     Pain Descriptors / Indicators Sore    Pain Type Acute pain    Pain Radiating Towards NA    Pain Onset More than a month ago    Pain Frequency Intermittent    Aggravating Factors  Stairs without a handrail and activities later in the day    Pain Relieving Factors Taking short breaks to rest and avoid overuse    Effect of Pain on Daily Activities Needs a handrail with stairs and limited endurance with ADLs    Multiple Pain Sites No                               [facility] Adult PT Treatment/Exercise - 09/03/20 0001       Posture/Postural Control   Posture/Postural Control Postural limitations    Postural Limitations Forward head;Rounded Shoulders;Decreased lumbar lordosis      Therapeutic Activites    Therapeutic Activities ADL's    ADL's Step-up and over 6 and 8 inch step with and without hands with supervision      Neuro Re-ed    Neuro Re-ed Details  Dynamic gait drills with long strides forward and backwards      Exercises   Exercises  Knee/Hip      Knee/Hip Exercises: Standing   Heel Raises Both;1 set;15 reps;3 seconds;Limitations    Heel Raises Limitations Heel and toe raises with and without hands    Other Standing Knee Exercises Standing trunk extension AROM 3 sets of 5X 3 seconds    Other Standing Knee Exercises Shoulder blade pinches 3 sets of 5 for 5 seconds      Knee/Hip Exercises: Seated   Sit to Sand 2 sets;5 reps;without UE support                    PT Education - 09/03/20 1148     Education Details Reviewed HEP and worked on dynamic gait with emphasis on long strides    Person(s) Educated Patient    Methods Explanation;Demonstration;Verbal cues    Comprehension Returned demonstration;Need further instruction;Verbalized understanding;Verbal cues required              PT Short Term Goals - 07/25/20 1807       PT SHORT TERM GOAL #1   Title Sandy Trujillo will be independent with her day 1 HEP.    Status Achieved      PT SHORT  TERM GOAL #2   Title Patient will report 2/10 pain at worst with walking    Status Achieved      PT SHORT TERM GOAL #3   Title Patient will be indepedent with modified exercise program for her hip    Status Achieved               PT Long Term Goals - 09/03/20 1148       PT LONG TERM GOAL #1   Title Improve FOTO to 65.    Baseline 52 (was 47 at eval)    Time 4    Period Weeks    Status On-going      PT LONG TERM GOAL #2   Title Sandy Trujillo will report B posterior thigh pain consistently <3/10 on the Numeric Pain Rating Scale.    Baseline 0-2/10    Status Achieved      PT LONG TERM GOAL #3   Title Sandy Trujillo will be able to walk 1/4 mile or more without apprehension and without an assistive device.    Baseline No problem with 1/4 mile or less without a cane.    Period Weeks    Status Achieved      PT LONG TERM GOAL #4   Title Sandy Trujillo will be independent with her long-term HEP.    Time 2    Status Achieved      PT LONG TERM GOAL #5   Title She will improve FGA to at least 22/30 to show improved balance    Time 4    Status On-going                   Plan - 09/03/20 1149     Clinical Impression Statement Sandy Trujillo reports she is pleased with her progress with supervised PT.  She would like to be more independent with stairs and have better endurance.  Sandy Trujillo was given the option of transfer into independent PT and she chooses to attend her last 2-3 visits to work on the areas noted above.    Personal Factors and Comorbidities Age;Comorbidity 3+    Comorbidities Previous L4-5 decompression, R leg weakness, HTN    Examination-Activity Limitations Bed Mobility;Bend;Lift;Squat;Stairs;Locomotion Level    Examination-Participation Restrictions Community Activity    Stability/Clinical Decision Making Stable/Uncomplicated  Rehab Potential Good    PT Frequency 1x / week   2-5 visits over 4 weeks   PT Duration 3 weeks    PT Treatment/Interventions ADLs/Self Care Home  Management;Cryotherapy;Therapeutic activities;Stair training;Gait training;Therapeutic exercise;Balance training;Neuromuscular re-education;Patient/family education;Manual techniques    PT Next Visit Plan Berg test    PT Home Exercise Plan Access Code: [email or web address]  URL: [email or web address]  Date: 06/24/2020  Prepared by: Kearney Hard    Exercises  Standing Lumbar Extension at [place] - 5 x daily - 7 x weekly - 1 sets - 5 reps - 3 seconds hold  Standing Scapular Retraction - 5 x daily - 7 x weekly - 1 sets - 5 reps - 5 second hold  Sit to Stand with Armchair - 2 x daily - 7 x weekly - 1 sets - 5 reps  Seated Hamstring Stretch with Strap - 2 x daily - 7 x weekly - 3-5 reps - 30 seconds hold  Supine Bridge - 2 x daily - 7 x weekly - 10 reps - 3-5 seconds hold  Supine Isometric Hamstring Set - 2 x daily - 7 x weekly - 10 reps - 5seconds hold  Tandem Stance - 2 x daily - 7 x weekly - 1 sets - 5 reps - 20 second hold  Standing Hip Abduction with Counter Support - 1 x daily - 7 x weekly - 15 reps  Standing Hip Extension with Counter Support - 1 x daily - 7 x weekly - 15 reps  Heel Toe Raises with Counter Support - 1 x daily - 7 x weekly - 10 reps  Heel rises with counter support - 1 x daily - 7 x weekly - 10 reps    Consulted and Agree with Plan of Care Patient             Patient will benefit from skilled therapeutic intervention in order to improve the following deficits and impairments:  Abnormal gait, Decreased activity tolerance, Decreased balance, Decreased endurance, Decreased strength, Difficulty walking, Increased edema, Impaired flexibility, Postural dysfunction, Improper body mechanics, Pain  Visit Diagnosis: Difficulty walking  Unsteadiness on feet  Abnormal posture  Muscle weakness (generalized)  Pain in left hip     Problem List Patient Active Problem List   Diagnosis Date Noted   HYPERLIPIDEMIA 04/18/2007   ALLERGIC RHINITIS 04/18/2007   PULMONARY  NODULE 04/18/2007   CARCINOMA, RECTUM 04/17/2007   ANXIETY 04/17/2007   HYPERTENSION 04/17/2007   COLITIS, ULCERATIVE 04/17/2007    Farley Ly PT, MPT 09/03/2020, 11:52 AM  [facility] Physical Therapy [address] [place], [place], [address] Phone: [phone]   Fax:  [phone]  Name: Sandy Trujillo MRN: [id number] Date of Birth: 05-10-1932

## 2020-09-03 NOTE — Patient Instructions (Signed)
Access Code: [email or web address] URL: [email or web address] Date: 09/03/2020 Prepared by: Vista Mink  Exercises Standing Lumbar Extension at [place] 5 x daily - 7 x weekly - 1 sets - 3-5 reps - 3 seconds hold Standing Scapular Retraction - 5 x daily - 7 x weekly - 1 sets - 3-5 reps - 5 second hold Sit to Stand with Armchair - 1-2 x daily - 7 x weekly - 1 sets - 5 reps Tandem Stance - 1-2 x daily - 7 x weekly - 1 sets - 5 reps - 20 second hold Heel Toe Raises with Counter Support - 1-2 x daily - 7 x weekly - 10 reps

## 2020-09-10 ENCOUNTER — Encounter: Payer: Self-pay | Admitting: Rehabilitative and Restorative Service Providers"

## 2020-09-10 ENCOUNTER — Ambulatory Visit (INDEPENDENT_AMBULATORY_CARE_PROVIDER_SITE_OTHER): Payer: Medicare Other | Admitting: Rehabilitative and Restorative Service Providers"

## 2020-09-10 ENCOUNTER — Other Ambulatory Visit: Payer: Self-pay

## 2020-09-10 DIAGNOSIS — R262 Difficulty in walking, not elsewhere classified: Secondary | ICD-10-CM | POA: Diagnosis not present

## 2020-09-10 DIAGNOSIS — M6281 Muscle weakness (generalized): Secondary | ICD-10-CM

## 2020-09-10 DIAGNOSIS — R2681 Unsteadiness on feet: Secondary | ICD-10-CM

## 2020-09-10 DIAGNOSIS — R293 Abnormal posture: Secondary | ICD-10-CM

## 2020-09-10 NOTE — Therapy (Signed)
[facility] Physical Therapy [address] [place], [place], [address] Phone: [phone]   Fax:  [phone]  Physical Therapy Treatment  Patient Details  Name: Sandy Trujillo MRN: [id number] Date of Birth: 05/28/1932 Referring Provider (PT): Mcarthur Rossetti MD   Encounter Date: 09/10/2020   PT End of Session - 09/10/20 1439     Visit Number 14    Number of Visits 17    Date for PT Re-Evaluation 09/19/20    PT Start Time 1346    PT Stop Time 1431    PT Time Calculation (min) 45 min    Equipment Utilized During Treatment Gait belt    Activity Tolerance Patient tolerated treatment well;No increased pain    Behavior During Therapy [facility] for tasks assessed/performed             Past Medical History:  Diagnosis Date   Hyperparathyroidism ([place]) 2019   Ileostomy in place [facility])    Osteoporosis 2019   rectal ca 2007   surg only    Past Surgical History:  Procedure Laterality Date   ABDOMINAL HYSTERECTOMY     BREAST EXCISIONAL BIOPSY Bilateral    benign   COLON SURGERY  2007    There were no vitals filed for this visit.   Subjective Assessment - 09/10/20 1404     Subjective Sandy Trujillo reports R hip pain and soreness over the past 4-5 days.  She only gets it in certain positions.    Pertinent History HTN, previous lumbar decompression L4-5 in August 2020, HTN    Limitations House hold activities;Lifting;Walking    How long can you sit comfortably? Not limited, just doesn't sit too long    How long can you stand comfortably? 30-60 minutes    How long can you walk comfortably? Has not tried > 10-15 minutes but OK with this    Patient Stated Goals Return to working out at the gym and be able to walk normally without hamstrings discomfort.    Currently in Pain? Yes    Pain Score 3     Pain Location Hip    Pain Orientation Right    Pain Descriptors / Indicators Sore    Pain Type Acute pain    Pain Radiating Towards NA    Pain Onset In the past  7 days    Pain Frequency Constant    Aggravating Factors  Flexed postures    Pain Relieving Factors Participating in new exercises assigned today and trunk extension AROM    Effect of Pain on Daily Activities Still working on endurance with WB and stairs without a handrail    Multiple Pain Sites No                [facility] PT Assessment - 09/10/20 0001       Posture/Postural Control   Posture/Postural Control Postural limitations    Postural Limitations Forward head;Rounded Shoulders;Decreased lumbar lordosis      AROM   Right/Left Hip Left;Right    Right Hip Flexion 95    Right Hip External Rotation  32    Right Hip Internal Rotation  9    Left Hip Flexion 95    Left Hip External Rotation  38    Left Hip Internal Rotation  9      Flexibility   Hamstrings 35 degrees                           [facility] Adult  PT Treatment/Exercise - 09/10/20 0001       Neuro Re-ed    Neuro Re-ed Details  Dynamic gait drills with long strides forward and backwards      Exercises   Exercises Knee/Hip      Knee/Hip Exercises: Standing   Heel Raises Both;1 set;15 reps;3 seconds;Limitations    Heel Raises Limitations Heel and toe raises with and without hands    Other Standing Knee Exercises Standing trunk extension AROM 3 sets of 5X 3 seconds    Other Standing Knee Exercises Shoulder blade pinches 3 sets of 5 for 5 seconds      Knee/Hip Exercises: Seated   Sit to Sand 2 sets;5 reps;without UE support      Knee/Hip Exercises: Supine   Other Supine Knee/Hip Exercises Figure 4 stretch 4X 20 seconds    Other Supine Knee/Hip Exercises Hamstrings stretch 4X 20 seconds                    PT Education - 09/10/20 1438     Education Details Corrected trunk extension AROM and reviewed current HEP with 2 additions to address new R hip pain.    Person(s) Educated Patient    Methods Explanation;Demonstration;Verbal cues;Handout    Comprehension Verbal cues  required;Returned demonstration;Need further instruction;Verbalized understanding              PT Short Term Goals - 07/25/20 1807       PT SHORT TERM GOAL #1   Title [name] will be independent with her day 1 HEP.    Status Achieved      PT SHORT TERM GOAL #2   Title Patient will report 2/10 pain at worst with walking    Status Achieved      PT SHORT TERM GOAL #3   Title Patient will be indepedent with modified exercise program for her hip    Status Achieved               PT Long Term Goals - 09/10/20 1438       PT LONG TERM GOAL #1   Title Improve FOTO to 65.    Baseline 52 (was 47 at eval)    Time 4    Period Weeks    Status On-going      PT LONG TERM GOAL #2   Title Sandy Trujillo will report B posterior thigh pain consistently <3/10 on the Numeric Pain Rating Scale.    Baseline 0-2/10    Status Achieved      PT LONG TERM GOAL #3   Title Sandy Trujillo will be able to walk 1/4 mile or more without apprehension and without an assistive device.    Baseline No problem with 1/4 mile or less without a cane.    Period Weeks    Status Achieved      PT LONG TERM GOAL #4   Title Sandy Trujillo will be independent with her long-term HEP.    Time 2    Status Achieved      PT LONG TERM GOAL #5   Title She will improve FGA to at least 22/30 to show improved balance    Time 4    Status On-going                   Plan - 09/10/20 1440     Clinical Impression Statement Sandy Trujillo reports overall satisfaction with her physical therapy, decreased pain and improved function.  R hip pain over the past < 1  week is a concern and was addressed today.  Endurance with WB and function without a handrail on stairs are still a focus and will be addressed over the next 2 visits before transfer into independent PT.    Personal Factors and Comorbidities Age;Comorbidity 3+    Comorbidities Previous L4-5 decompression, R leg weakness, HTN    Examination-Activity Limitations Bed  Mobility;Bend;Lift;Squat;Stairs;Locomotion Level    Examination-Participation Restrictions Community Activity    Stability/Clinical Decision Making Stable/Uncomplicated    Rehab Potential Good    PT Frequency 1x / week   2-5 visits over 4 weeks   PT Duration 2 weeks    PT Treatment/Interventions ADLs/Self Care Home Management;Cryotherapy;Therapeutic activities;Stair training;Gait training;Therapeutic exercise;Balance training;Neuromuscular re-education;Patient/family education;Manual techniques    PT Next Visit Plan Berg test and check on R hip progress    PT Home Exercise Plan Access Code: [email or web address]  URL: [email or web address]  Date: 06/24/2020  Prepared by: Kearney Hard    Exercises  Standing Lumbar Extension at [place] - 5 x daily - 7 x weekly - 1 sets - 5 reps - 3 seconds hold  Standing Scapular Retraction - 5 x daily - 7 x weekly - 1 sets - 5 reps - 5 second hold  Sit to Stand with Armchair - 2 x daily - 7 x weekly - 1 sets - 5 reps  Seated Hamstring Stretch with Strap - 2 x daily - 7 x weekly - 3-5 reps - 30 seconds hold  Supine Bridge - 2 x daily - 7 x weekly - 10 reps - 3-5 seconds hold  Supine Isometric Hamstring Set - 2 x daily - 7 x weekly - 10 reps - 5seconds hold  Tandem Stance - 2 x daily - 7 x weekly - 1 sets - 5 reps - 20 second hold  Standing Hip Abduction with Counter Support - 1 x daily - 7 x weekly - 15 reps  Standing Hip Extension with Counter Support - 1 x daily - 7 x weekly - 15 reps  Heel Toe Raises with Counter Support - 1 x daily - 7 x weekly - 10 reps  Heel rises with counter support - 1 x daily - 7 x weekly - 10 reps    Consulted and Agree with Plan of Care Patient             Patient will benefit from skilled therapeutic intervention in order to improve the following deficits and impairments:  Abnormal gait, Decreased activity tolerance, Decreased balance, Decreased endurance, Decreased strength, Difficulty walking, Increased edema, Impaired  flexibility, Postural dysfunction, Improper body mechanics, Pain  Visit Diagnosis: Difficulty walking  Unsteadiness on feet  Abnormal posture  Muscle weakness (generalized)     Problem List Patient Active Problem List   Diagnosis Date Noted   HYPERLIPIDEMIA 04/18/2007   ALLERGIC RHINITIS 04/18/2007   PULMONARY NODULE 04/18/2007   CARCINOMA, RECTUM 04/17/2007   ANXIETY 04/17/2007   HYPERTENSION 04/17/2007   COLITIS, ULCERATIVE 04/17/2007    Farley Ly PT, MPT 09/10/2020, 2:54 PM  [facility] [address] [place], [place], [address] Phone: [phone]   Fax:  [phone]  Name: Sandy Trujillo MRN: [id number] Date of Birth: [birth year]

## 2020-09-10 NOTE — Patient Instructions (Signed)
Access Code: [email or web address] URL: [email or web address] Date: 09/10/2020 Prepared by: Vista Mink  Exercises Standing Lumbar Extension at [place] - 5 x daily - 7 x weekly - 1 sets - 3-5 reps - 3 seconds hold Standing Scapular Retraction - 5 x daily - 7 x weekly - 1 sets - 3-5 reps - 5 second hold Sit to Stand with Armchair - 1-2 x daily - 7 x weekly - 1 sets - 5 reps Tandem Stance - 1-2 x daily - 7 x weekly - 1 sets - 5 reps - 20 second hold Heel Toe Raises with Counter Support - 1-2 x daily - 7 x weekly - 10 reps Supine Figure 4 Piriformis Stretch - 1-2 x daily - 7 x weekly - 1 sets - 5 reps - 20 seconds hold Supine Hamstring Stretch - 1-2 x daily - 7 x weekly - 1 sets - 5 reps - 20 seconds hold

## 2020-09-24 ENCOUNTER — Encounter: Payer: Medicare Other | Admitting: Rehabilitative and Restorative Service Providers"

## 2020-10-01 ENCOUNTER — Encounter: Payer: Self-pay | Admitting: Rehabilitative and Restorative Service Providers"

## 2020-10-01 ENCOUNTER — Other Ambulatory Visit: Payer: Self-pay

## 2020-10-01 ENCOUNTER — Ambulatory Visit (INDEPENDENT_AMBULATORY_CARE_PROVIDER_SITE_OTHER): Payer: Medicare Other | Admitting: Rehabilitative and Restorative Service Providers"

## 2020-10-01 DIAGNOSIS — M6281 Muscle weakness (generalized): Secondary | ICD-10-CM

## 2020-10-01 DIAGNOSIS — R262 Difficulty in walking, not elsewhere classified: Secondary | ICD-10-CM | POA: Diagnosis not present

## 2020-10-01 DIAGNOSIS — R2681 Unsteadiness on feet: Secondary | ICD-10-CM | POA: Diagnosis not present

## 2020-10-01 NOTE — Therapy (Signed)
[facility] Physical Therapy [address] [place], [place], [address] Phone: [phone]   Fax:  [phone]  Physical Therapy Treatment/Discharge  Patient Details  Name: Sandy Trujillo MRN: [id number] Date of Birth: 04-01-1932 Referring Provider (PT): Mcarthur Rossetti MD  PHYSICAL THERAPY DISCHARGE SUMMARY  Visits from [facility] of [facility]: 15  Current functional level related to goals / functional outcomes: See note-much better   Remaining deficits: Minimal-see note   Education / Equipment: Updated HEP   Patient agrees to discharge. Patient goals were met. Patient is being discharged due to meeting the stated rehab goals.  Encounter Date: 10/01/2020   PT End of Session - 10/01/20 1716     Visit Number 15    Number of Visits 17    Date for PT Re-Evaluation 09/19/20    PT Start Time 1301    PT Stop Time 1345    PT Time Calculation (min) 44 min    Activity Tolerance Patient tolerated treatment well;No increased pain    Behavior During Therapy [facility] for tasks assessed/performed             Past Medical History:  Diagnosis Date   Hyperparathyroidism ([place]) 2019   Ileostomy in place [facility])    Osteoporosis 2019   rectal ca 2007   surg only    Past Surgical History:  Procedure Laterality Date   ABDOMINAL HYSTERECTOMY     BREAST EXCISIONAL BIOPSY Bilateral    benign   COLON SURGERY  2007    There were no vitals filed for this visit.   Subjective Assessment - 10/01/20 1329     Subjective Anvita reports R hip pain only with overuse.  She hasn't used the cane in months.    Pertinent History HTN, previous lumbar decompression L4-5 in August 2020, HTN    Limitations House hold activities;Lifting;Walking    How long can you sit comfortably? Not limited, just doesn't sit too long    How long can you stand comfortably? 1 hour    How long can you walk comfortably? Has not tried > 10-15 minutes but OK with this    Patient Stated Goals She has been  working out for about 30 minutes at the gym and no hamstrings discomfort    Currently in Pain? No/denies    Pain Score 0-No pain    Pain Location Hip    Pain Orientation Right    Pain Radiating Towards NA    Pain Onset More than a month ago    Pain Frequency Rarely    Aggravating Factors  Flexed postures    Pain Relieving Factors Exercises and adequte rest    Effect of Pain on Daily Activities Gradually increasing endurance with walking    Multiple Pain Sites No                [facility] PT Assessment - 10/01/20 0001       Observation/Other Assessments   Focus on Therapeutic Outcomes (FOTO)  64 (Goal 65, was 47)                           [facility] Adult PT Treatment/Exercise - 10/01/20 0001       Therapeutic Activites    Therapeutic Activities Other Therapeutic Activities    Other Therapeutic Activities Reviewed exam findings and long-term HEP      Neuro Re-ed    Neuro Re-ed Details  Dynamic gait drills with long strides forward, side to side  and backwards      Exercises   Exercises Knee/Hip      Knee/Hip Exercises: Standing   Heel Raises Both;1 set;15 reps;3 seconds;Limitations    Heel Raises Limitations Heel and toe raises with and without hands    Other Standing Knee Exercises Hip hike in doorway 10X 3 seconds    Other Standing Knee Exercises Trunk extension AROM and shoulder blades pinch 5X 3/5 seconds      Knee/Hip Exercises: Seated   Sit to Sand 2 sets;5 reps;without UE support      Knee/Hip Exercises: Supine   Other Supine Knee/Hip Exercises Figure 4 stretch 4X 20 seconds    Other Supine Knee/Hip Exercises Hamstrings stretch 4X 20 seconds                  Upper Extremity Functional Index Score :   /80   PT Education - 10/01/20 1715     Education Details Reviewed objective measures from last visit, FOTO and long-term HEP.    Person(s) Educated Patient    Methods Explanation;Verbal cues;Handout    Comprehension Verbalized  understanding;Returned demonstration;Verbal cues required              PT Short Term Goals - 07/25/20 1807       PT SHORT TERM GOAL #1   Title Kita will be independent with her day 1 HEP.    Status Achieved      PT SHORT TERM GOAL #2   Title Patient will report 2/10 pain at worst with walking    Status Achieved      PT SHORT TERM GOAL #3   Title Patient will be indepedent with modified exercise program for her hip    Status Achieved               PT Long Term Goals - 10/01/20 1715       PT LONG TERM GOAL #1   Title Improve FOTO to 65.    Baseline 64 (was 47 at eval)    Time 4    Period Weeks    Status On-going      PT LONG TERM GOAL #2   Title Alahni will report B posterior thigh pain consistently <3/10 on the Numeric Pain Rating Scale.    Baseline 0-2/10    Status Achieved      PT LONG TERM GOAL #3   Title Frank will be able to walk 1/4 mile or more without apprehension and without an assistive device.    Baseline No problem with 1/4 mile or less without a cane.    Period Weeks    Status Achieved      PT LONG TERM GOAL #4   Title Rainie will be independent with her long-term HEP.    Time 2    Status Achieved      PT LONG TERM GOAL #5   Title She will improve FGA to at least 22/30 to show improved balance    Time 4    Status On-going                   Plan - 10/01/20 1716     Clinical Impression Statement Elanore has met most long-term goals established at evaluation.  She has demonstrated independence and compliance with her long-term HEP.  Leather has been a pleasure to work with and is welcome to return if requested.  I believe she will do well with her independent maintenance program.    Personal  Factors and Comorbidities Age;Comorbidity 3+    Comorbidities Previous L4-5 decompression, R leg weakness, HTN    Examination-Activity Limitations Bed Mobility;Bend;Lift;Squat;Stairs;Locomotion Level    Examination-Participation Restrictions  Community Activity    Stability/Clinical Decision Making Stable/Uncomplicated    Rehab Potential Good    PT Frequency Other (comment)   2-5 visits over 4 weeks   PT Duration Other (comment)   DC   PT Treatment/Interventions ADLs/Self Care Home Management;Cryotherapy;Therapeutic activities;Stair training;Gait training;Therapeutic exercise;Balance training;Neuromuscular re-education;Patient/family education;Manual techniques    PT Next Visit Plan DC    PT Home Exercise Plan [id number]    Consulted and Agree with Plan of Care Patient             Patient will benefit from skilled therapeutic intervention in order to improve the following deficits and impairments:  Abnormal gait, Decreased activity tolerance, Decreased balance, Decreased endurance, Decreased strength, Difficulty walking, Increased edema, Impaired flexibility, Postural dysfunction, Improper body mechanics, Pain  Visit Diagnosis: Difficulty walking  Muscle weakness (generalized)  Unsteadiness on feet     Problem List Patient Active Problem List   Diagnosis Date Noted   HYPERLIPIDEMIA 04/18/2007   ALLERGIC RHINITIS 04/18/2007   PULMONARY NODULE 04/18/2007   CARCINOMA, RECTUM 04/17/2007   ANXIETY 04/17/2007   HYPERTENSION 04/17/2007   COLITIS, ULCERATIVE 04/17/2007    Farley Ly, PT, MPT 10/01/2020, 5:20 PM  [facility] Physical Therapy [address] [place], [place], [address] Phone: [phone]   Fax:  [phone]  Name: NABIHA PLANCK MRN: [id number] Date of Birth: Jun 07, 1932

## 2020-10-01 NOTE — Patient Instructions (Signed)
Access Code: [email or web address] URL: [email or web address] Date: 10/01/2020 Prepared by: Vista Mink  Exercises Standing Lumbar Extension at [place] - 3-5 x daily - 7 x weekly - 1 sets - 3-5 reps - 3 seconds hold Standing Scapular Retraction - 5 x daily - 7 x weekly - 1 sets - 3-5 reps - 5 second hold Sit to Stand with Armchair - 1 x daily - 3 x weekly - 2 sets - 5 reps Tandem Stance - 1 x daily - 3 x weekly - 1 sets - 5 reps - 20 second hold Heel Toe Raises with Counter Support - 1 x daily - 3 x weekly - 10 reps Supine Figure 4 Piriformis Stretch - 1 x daily - 7 x weekly - 1 sets - 5 reps - 20 seconds hold Supine Hamstring Stretch - 1 x daily - 7 x weekly - 1 sets - 5 reps - 20 seconds hold Standing Hip Hiking - 1 x daily - 3 x weekly - 1 sets - 10 reps - 3 seconds hold

## 2020-10-02 DIAGNOSIS — Z23 Encounter for immunization: Secondary | ICD-10-CM | POA: Diagnosis not present

## 2020-10-02 DIAGNOSIS — I10 Essential (primary) hypertension: Secondary | ICD-10-CM | POA: Diagnosis not present

## 2020-11-24 DIAGNOSIS — R0982 Postnasal drip: Secondary | ICD-10-CM | POA: Diagnosis not present

## 2020-11-24 DIAGNOSIS — R110 Nausea: Secondary | ICD-10-CM | POA: Diagnosis not present

## 2020-11-24 DIAGNOSIS — Z03818 Encounter for observation for suspected exposure to other biological agents ruled out: Secondary | ICD-10-CM | POA: Diagnosis not present

## 2020-11-24 DIAGNOSIS — R051 Acute cough: Secondary | ICD-10-CM | POA: Diagnosis not present

## 2020-12-05 DIAGNOSIS — I10 Essential (primary) hypertension: Secondary | ICD-10-CM | POA: Diagnosis not present

## 2021-01-08 DIAGNOSIS — Z8349 Family history of other endocrine, nutritional and metabolic diseases: Secondary | ICD-10-CM | POA: Diagnosis not present

## 2021-01-08 DIAGNOSIS — E042 Nontoxic multinodular goiter: Secondary | ICD-10-CM | POA: Diagnosis not present

## 2021-01-08 DIAGNOSIS — M810 Age-related osteoporosis without current pathological fracture: Secondary | ICD-10-CM | POA: Diagnosis not present

## 2021-01-08 DIAGNOSIS — K219 Gastro-esophageal reflux disease without esophagitis: Secondary | ICD-10-CM | POA: Diagnosis not present

## 2021-01-08 DIAGNOSIS — Z808 Family history of malignant neoplasm of other organs or systems: Secondary | ICD-10-CM | POA: Diagnosis not present

## 2021-01-13 DIAGNOSIS — I10 Essential (primary) hypertension: Secondary | ICD-10-CM | POA: Diagnosis not present

## 2021-01-20 DIAGNOSIS — L578 Other skin changes due to chronic exposure to nonionizing radiation: Secondary | ICD-10-CM | POA: Diagnosis not present

## 2021-01-20 DIAGNOSIS — L821 Other seborrheic keratosis: Secondary | ICD-10-CM | POA: Diagnosis not present

## 2021-01-20 DIAGNOSIS — D2339 Other benign neoplasm of skin of other parts of face: Secondary | ICD-10-CM | POA: Diagnosis not present

## 2021-01-29 DIAGNOSIS — E213 Hyperparathyroidism, unspecified: Secondary | ICD-10-CM | POA: Diagnosis not present

## 2021-02-03 DIAGNOSIS — I10 Essential (primary) hypertension: Secondary | ICD-10-CM | POA: Diagnosis not present

## 2021-02-19 ENCOUNTER — Other Ambulatory Visit (HOSPITAL_COMMUNITY): Payer: Self-pay | Admitting: *Deleted

## 2021-02-20 ENCOUNTER — Ambulatory Visit (HOSPITAL_COMMUNITY): Payer: Medicare Other

## 2021-03-13 ENCOUNTER — Other Ambulatory Visit: Payer: Self-pay | Admitting: Surgery

## 2021-03-13 ENCOUNTER — Other Ambulatory Visit (HOSPITAL_COMMUNITY): Payer: Self-pay | Admitting: Surgery

## 2021-03-24 ENCOUNTER — Ambulatory Visit (HOSPITAL_COMMUNITY)
Admission: RE | Admit: 2021-03-24 | Discharge: 2021-03-24 | Disposition: A | Discharge status: Home / Self Care | Payer: Medicare Other | Source: Ambulatory Visit | Attending: Surgery | Admitting: Surgery

## 2021-03-24 ENCOUNTER — Other Ambulatory Visit: Payer: Self-pay

## 2021-03-24 DIAGNOSIS — E042 Nontoxic multinodular goiter: Secondary | ICD-10-CM | POA: Diagnosis not present

## 2021-03-24 NOTE — Progress Notes (Signed)
USN is negative for parathyroid adenoma.  She does need a final follow up ultrasound of her thyroid nodules in one year.  Await results of nuclear med parathyroid scan.  tmg  Armandina Gemma, [facility] A [place] practice Office: [phone]

## 2021-03-26 ENCOUNTER — Ambulatory Visit (HOSPITAL_COMMUNITY)
Admission: RE | Admit: 2021-03-26 | Discharge: 2021-03-26 | Disposition: A | Discharge status: Home / Self Care | Payer: Medicare Other | Source: Ambulatory Visit | Attending: Surgery | Admitting: Surgery

## 2021-03-26 ENCOUNTER — Other Ambulatory Visit: Payer: Self-pay

## 2021-03-26 MED ORDER — TECHNETIUM TC 99M SESTAMIBI - CARDIOLITE
25.0000 | Freq: Once | INTRAVENOUS | Status: AC | PRN
  Administered 2021-03-26: 25 via INTRAVENOUS

## 2021-03-30 NOTE — Progress Notes (Signed)
Labs are very consistent with primary hyperparathyroidism.  USN and sestamibi scans are negative for parathyroid adenoma. ? ?Claiborne Billings - please arrange for 4D-CT scan of the neck with parathyroid protocol in hopes of finding the adenoma pre-operatively. ? ?tmg ? ?Armandina Gemma, MD ?[facility] ?A [facility] practice ?Office: [phone] ?

## 2021-03-31 DIAGNOSIS — E222 Syndrome of inappropriate secretion of antidiuretic hormone: Secondary | ICD-10-CM | POA: Diagnosis not present

## 2021-03-31 DIAGNOSIS — E042 Nontoxic multinodular goiter: Secondary | ICD-10-CM | POA: Diagnosis not present

## 2021-03-31 DIAGNOSIS — E213 Hyperparathyroidism, unspecified: Secondary | ICD-10-CM | POA: Diagnosis not present

## 2021-03-31 DIAGNOSIS — Z1389 Encounter for screening for other disorder: Secondary | ICD-10-CM | POA: Diagnosis not present

## 2021-03-31 DIAGNOSIS — K219 Gastro-esophageal reflux disease without esophagitis: Secondary | ICD-10-CM | POA: Diagnosis not present

## 2021-03-31 DIAGNOSIS — Z23 Encounter for immunization: Secondary | ICD-10-CM | POA: Diagnosis not present

## 2021-03-31 DIAGNOSIS — Z0000 Encounter for general adult medical examination without abnormal findings: Secondary | ICD-10-CM | POA: Diagnosis not present

## 2021-03-31 DIAGNOSIS — Z932 Ileostomy status: Secondary | ICD-10-CM | POA: Diagnosis not present

## 2021-03-31 DIAGNOSIS — H9190 Unspecified hearing loss, unspecified ear: Secondary | ICD-10-CM | POA: Diagnosis not present

## 2021-03-31 DIAGNOSIS — Z79899 Other long term (current) drug therapy: Secondary | ICD-10-CM | POA: Diagnosis not present

## 2021-03-31 DIAGNOSIS — B372 Candidiasis of skin and nail: Secondary | ICD-10-CM | POA: Diagnosis not present

## 2021-03-31 DIAGNOSIS — I10 Essential (primary) hypertension: Secondary | ICD-10-CM | POA: Diagnosis not present

## 2021-04-06 DIAGNOSIS — H6122 Impacted cerumen, left ear: Secondary | ICD-10-CM | POA: Diagnosis not present

## 2021-04-08 ENCOUNTER — Other Ambulatory Visit: Payer: Self-pay | Admitting: Surgery

## 2021-04-08 DIAGNOSIS — D351 Benign neoplasm of parathyroid gland: Secondary | ICD-10-CM

## 2021-04-20 DIAGNOSIS — H04123 Dry eye syndrome of bilateral lacrimal glands: Secondary | ICD-10-CM | POA: Diagnosis not present

## 2021-04-20 DIAGNOSIS — Z961 Presence of intraocular lens: Secondary | ICD-10-CM | POA: Diagnosis not present

## 2021-04-28 DIAGNOSIS — I10 Essential (primary) hypertension: Secondary | ICD-10-CM | POA: Diagnosis not present

## 2021-05-08 ENCOUNTER — Ambulatory Visit
Admission: RE | Admit: 2021-05-08 | Discharge: 2021-05-08 | Disposition: A | Discharge status: Home / Self Care | Payer: Medicare Other | Source: Ambulatory Visit | Attending: Surgery | Admitting: Surgery

## 2021-05-08 DIAGNOSIS — E042 Nontoxic multinodular goiter: Secondary | ICD-10-CM | POA: Diagnosis not present

## 2021-05-08 DIAGNOSIS — D351 Benign neoplasm of parathyroid gland: Secondary | ICD-10-CM

## 2021-05-08 DIAGNOSIS — M47812 Spondylosis without myelopathy or radiculopathy, cervical region: Secondary | ICD-10-CM | POA: Diagnosis not present

## 2021-05-08 DIAGNOSIS — I700 Atherosclerosis of aorta: Secondary | ICD-10-CM | POA: Diagnosis not present

## 2021-05-08 MED ORDER — IOPAMIDOL (ISOVUE-300) INJECTION 61%
75.0000 mL | Freq: Once | INTRAVENOUS | Status: AC | PRN
  Administered 2021-05-08: 75 mL via INTRAVENOUS

## 2021-05-12 ENCOUNTER — Ambulatory Visit: Payer: Self-pay | Admitting: Surgery

## 2021-05-12 NOTE — Progress Notes (Signed)
Telephone call to patient regarding results.  Will need neck exploration on both sides to evaluate.  Possibility of overnight stay discussed with the patient. ? ?Will send to schedulers to contact patient. ? ?tmg ? ?Armandina Gemma, MD ?[facility] ?A [facility] practice ?Office: [phone] ?

## 2021-05-14 NOTE — Progress Notes (Signed)
DUE TO COVID-19 ONLY  2 VISITOR IS ALLOWED TO COME WITH YOU AND STAY IN THE WAITING ROOM ONLY DURING PRE OP AND PROCEDURE DAY OF SURGERY.  4  VISITOR  MAY VISIT WITH YOU AFTER SURGERY IN YOUR PRIVATE ROOM DURING VISITING HOURS ONLY! ?YOU MAY HAVE ONE PERSON SPEND THE NITE WITH YOU IN YOUR ROOM AFTER SURGERY.   ? ? Your procedure is scheduled on:  ?  05/26/21  ? Report to [facility] Main  Entrance ? ? Report to admitting at   0515             AM ?DO NOT [place], PICTURE ID OR WALLET DAY OF SURGERY.  ?  ? ? Call this number if you have problems the morning of surgery [phone]  ? ? REMEMBER: NO  SOLID FOODS , CANDY, GUM OR MINTS AFTER MIDNITE THE NITE BEFORE SURGERY .       Marland Kitchen CLEAR LIQUIDS UNTIL     0430am            DAY OF SURGERY.      PLEASE FINISH  g2 lower sugar DRINK PER SURGEON ORDER  WHICH NEEDS TO BE COMPLETED AT    0430am        MORNING OF SURGERY.   ? ? ? ? ?CLEAR LIQUID DIET ? ? ?Foods Allowed      ?WATER ?BLACK COFFEE ( SUGAR OK, NO MILK, CREAM OR CREAMER) REGULAR AND DECAF  ?TEA ( SUGAR OK NO MILK, CREAM, OR CREAMER) REGULAR AND DECAF  ?PLAIN JELLO ( NO RED)  ?FRUIT ICES ( NO RED, NO FRUIT PULP)  ?POPSICLES ( NO RED)  ?JUICE- APPLE, WHITE GRAPE AND WHITE CRANBERRY  ?SPORT DRINK LIKE GATORADE ( NO RED)  ?CLEAR BROTH ( VEGETABLE , CHICKEN OR BEEF)                                                               ? ?    ? ?BRUSH YOUR TEETH MORNING OF SURGERY AND RINSE YOUR MOUTH OUT, NO CHEWING GUM CANDY OR MINTS. ?  ? ? Take these medicines the morning of surgery with A SIP OF WATER:  atenolol  ? ? ?DO NOT TAKE ANY DIABETIC MEDICATIONS DAY OF YOUR SURGERY ?                  ?            You may not have any metal on your body including hair pins and  ?            piercings  Do not wear jewelry, make-up, lotions, powders or perfumes, deodorant ?            Do not wear nail polish on your fingernails.   ?           IF YOU ARE A FEMALE AND WANT TO SHAVE UNDER ARMS OR LEGS PRIOR TO SURGERY  YOU MUST DO SO AT LEAST 48 HOURS PRIOR TO SURGERY.  ?            Men may shave face and neck. ? ? Do not bring valuables to the hospital. [facility] NOT ?  RESPONSIBLE   FOR VALUABLES. ? Contacts, dentures or bridgework may not be worn into surgery. ? Leave suitcase in the car. After surgery it may be brought to your room. ? ?  ? Patients discharged the day of surgery will not be allowed to drive home. IF YOU ARE HAVING SURGERY AND GOING HOME THE SAME DAY, YOU MUST HAVE AN ADULT TO DRIVE YOU HOME AND BE WITH YOU FOR 24 HOURS. YOU MAY GO HOME BY TAXI OR UBER OR ORTHERWISE, BUT AN ADULT MUST ACCOMPANY YOU HOME AND STAY WITH YOU FOR 24 HOURS. ?  ? ?            Please read over the following fact sheets you were given: ?_____________________________________________________________________ ? ?[facility] - Preparing for Surgery ?Before surgery, you can play an important role.  Because skin is not sterile, your skin needs to be as free of germs as possible.  You can reduce the number of germs on your skin by washing with CHG (chlorahexidine gluconate) soap before surgery.  CHG is an antiseptic cleaner which kills germs and bonds with the skin to continue killing germs even after washing. ?Please DO NOT use if you have an allergy to CHG or antibacterial soaps.  If your skin becomes reddened/irritated stop using the CHG and inform your nurse when you arrive at Short Stay. ?Do not shave (including legs and underarms) for at least 48 hours prior to the first CHG shower.  You may shave your face/neck. ?Please follow these instructions carefully: ? 1.  Shower with CHG Soap the night before surgery and the  morning of Surgery. ? 2.  If you choose to wash your hair, wash your hair first as usual with your  normal  shampoo. ? 3.  After you shampoo, rinse your hair and body thoroughly to remove the  shampoo.                           4.  Use CHG as you would any other liquid soap.  You can apply chg directly  to the  skin and wash  ?                     Gently with a scrungie or clean washcloth. ? 5.  Apply the CHG Soap to your body ONLY FROM THE NECK DOWN.   Do not use on face/ open      ?                     Wound or open sores. Avoid contact with eyes, ears mouth and genitals (private parts).  ?                     Production manager,  Genitals (private parts) with your normal soap. ?            6.  Wash thoroughly, paying special attention to the area where your surgery  will be performed. ? 7.  Thoroughly rinse your body with warm water from the neck down. ? 8.  DO NOT shower/wash with your normal soap after using and rinsing off  the CHG Soap. ?               9.  Pat yourself dry with a clean towel. ?           10.  Wear clean pajamas. ?  11.  Place clean sheets on your bed the night of your first shower and do not  sleep with pets. ?Day of Surgery : ?Do not apply any lotions/deodorants the morning of surgery.  Please wear clean clothes to the hospital/surgery center. ? ?FAILURE TO FOLLOW THESE INSTRUCTIONS MAY RESULT IN THE CANCELLATION OF YOUR SURGERY ?PATIENT SIGNATURE_________________________________ ? ?NURSE SIGNATURE__________________________________ ? ?________________________________________________________________________  ? ? ?           ?

## 2021-05-14 NOTE — Progress Notes (Addendum)
Anesthesia Review: ? ?PCP: DR Lajean Manes  ?Cardiologist : none  ?Chest x-ray : 05/19/21  ?EKG : 05/19/21  ?Echo : ?Stress test: ?Cardiac Cath :  ?Activity level:  can do a flight of stairs without difficulty  ?Sleep Study/ CPAP : none  ?Fasting Blood Sugar :      / Checks Blood Sugar -- times a day:   ?Blood Thinner/ Instructions /Last Dose: ?ASA / Instructions/ Last Dose :   ?Has ileostomy  ?PT states at preop she was recently taken off of Atenolol.  PT only on Lisinopril for blood  pressure.  Blood pressure at preop was 189/86 in right arm and 187/87 in left arm. PT denies any chest pain dizziness headach blurred vision or shortness of breath.  PT states' I was afraid my blood pressure would be elevated.  I am going to call Dr Felipa Eth.  ."  PT given copy of blood pressure readings from preop.  Pt voiced understanding that she will notify MD of blood pressure readings.  Daugter present at preop appt.  ?EKG at preop shows accelerated junctional rhythm.  Lyndon Code, [facility] aware.  NO new orders given .   ?BMp done 05/19/21 routed to Dr Armandina Gemma.  Sodium 129 and Calcium 11.1.  Lyndon Code, [facility] aware.   ?

## 2021-05-18 DIAGNOSIS — Z20822 Contact with and (suspected) exposure to covid-19: Secondary | ICD-10-CM | POA: Diagnosis not present

## 2021-05-19 ENCOUNTER — Ambulatory Visit (HOSPITAL_COMMUNITY)
Admission: RE | Admit: 2021-05-19 | Discharge: 2021-05-19 | Disposition: A | Discharge status: Home / Self Care | Payer: Medicare Other | Source: Ambulatory Visit | Attending: Anesthesiology | Admitting: Anesthesiology

## 2021-05-19 ENCOUNTER — Encounter (HOSPITAL_COMMUNITY)
Admission: RE | Admit: 2021-05-19 | Discharge: 2021-05-19 | Disposition: A | Discharge status: Home / Self Care | Payer: Medicare Other | Source: Ambulatory Visit | Attending: Surgery | Admitting: Surgery

## 2021-05-19 ENCOUNTER — Encounter (HOSPITAL_COMMUNITY): Payer: Self-pay

## 2021-05-19 ENCOUNTER — Other Ambulatory Visit: Payer: Self-pay

## 2021-05-19 VITALS — BP 189/86 | HR 86 | Temp 97.7°F | Resp 16 | Ht 60.0 in | Wt 141.0 lb

## 2021-05-19 DIAGNOSIS — Z01818 Encounter for other preprocedural examination: Secondary | ICD-10-CM | POA: Insufficient documentation

## 2021-05-19 HISTORY — DX: Essential (primary) hypertension: I10

## 2021-05-19 HISTORY — DX: Gastro-esophageal reflux disease without esophagitis: K21.9

## 2021-05-19 HISTORY — DX: Pneumonia, unspecified organism: J18.9

## 2021-05-19 HISTORY — DX: Anemia, unspecified: D64.9

## 2021-05-19 HISTORY — DX: Anxiety disorder, unspecified: F41.9

## 2021-05-19 LAB — CBC
HCT: 36.4 % (ref 36.0–46.0)
Hemoglobin: 11.6 g/dL — ABNORMAL LOW (ref 12.0–15.0)
MCH: 28.8 pg (ref 26.0–34.0)
MCHC: 31.9 g/dL (ref 30.0–36.0)
MCV: 90.3 fL (ref 80.0–100.0)
Platelets: 302 10*3/uL (ref 150–400)
RBC: 4.03 MIL/uL (ref 3.87–5.11)
RDW: 12.9 % (ref 11.5–15.5)
WBC: 8 10*3/uL (ref 4.0–10.5)
nRBC: 0 % (ref 0.0–0.2)

## 2021-05-19 LAB — BASIC METABOLIC PANEL
Anion gap: 6 (ref 5–15)
BUN: 23 mg/dL (ref 8–23)
CO2: 27 mmol/L (ref 22–32)
Calcium: 11 mg/dL — ABNORMAL HIGH (ref 8.9–10.3)
Chloride: 96 mmol/L — ABNORMAL LOW (ref 98–111)
Creatinine, Ser: 0.8 mg/dL (ref 0.44–1.00)
GFR, Estimated: >60 mL/min (ref >60)
Glucose, Bld: 101 mg/dL — ABNORMAL HIGH (ref 70–99)
Potassium: 4.8 mmol/L (ref 3.5–5.1)
Sodium: 129 mmol/L — ABNORMAL LOW (ref 135–145)

## 2021-05-26 ENCOUNTER — Encounter (HOSPITAL_COMMUNITY): Payer: Self-pay | Admitting: Surgery

## 2021-05-26 ENCOUNTER — Ambulatory Visit (HOSPITAL_COMMUNITY)
Admission: RE | Admit: 2021-05-26 | Discharge: 2021-05-26 | Disposition: A | Discharge status: Home / Self Care | Payer: Medicare Other | Attending: Surgery | Admitting: Surgery

## 2021-05-26 ENCOUNTER — Encounter (HOSPITAL_COMMUNITY)
Admission: RE | Disposition: A | Discharge status: Home / Self Care | Payer: Self-pay | Source: Home / Self Care | Attending: Surgery

## 2021-05-26 ENCOUNTER — Ambulatory Visit (HOSPITAL_BASED_OUTPATIENT_CLINIC_OR_DEPARTMENT_OTHER): Payer: Medicare Other | Admitting: Anesthesiology

## 2021-05-26 ENCOUNTER — Ambulatory Visit (HOSPITAL_COMMUNITY): Payer: Medicare Other | Admitting: Physician Assistant

## 2021-05-26 ENCOUNTER — Other Ambulatory Visit: Payer: Self-pay

## 2021-05-26 DIAGNOSIS — Z01818 Encounter for other preprocedural examination: Secondary | ICD-10-CM

## 2021-05-26 DIAGNOSIS — E210 Primary hyperparathyroidism: Secondary | ICD-10-CM

## 2021-05-26 DIAGNOSIS — Z808 Family history of malignant neoplasm of other organs or systems: Secondary | ICD-10-CM | POA: Diagnosis not present

## 2021-05-26 DIAGNOSIS — M8580 Other specified disorders of bone density and structure, unspecified site: Secondary | ICD-10-CM | POA: Insufficient documentation

## 2021-05-26 DIAGNOSIS — D351 Benign neoplasm of parathyroid gland: Secondary | ICD-10-CM | POA: Diagnosis not present

## 2021-05-26 HISTORY — PX: PARATHYROIDECTOMY: SHX19

## 2021-05-26 SURGERY — PARATHYROIDECTOMY
Anesthesia: General | Site: Neck

## 2021-05-26 MED ORDER — ONDANSETRON HCL 4 MG/2ML IJ SOLN
INTRAMUSCULAR | Status: AC
  Filled 2021-05-26: qty 2

## 2021-05-26 MED ORDER — GLYCOPYRROLATE 0.2 MG/ML IJ SOLN
INTRAMUSCULAR | Status: DC | PRN
  Administered 2021-05-26: .2 mg via INTRAVENOUS

## 2021-05-26 MED ORDER — SUGAMMADEX SODIUM 200 MG/2ML IV SOLN
INTRAVENOUS | Status: DC | PRN
  Administered 2021-05-26: 200 mg via INTRAVENOUS

## 2021-05-26 MED ORDER — CHLORHEXIDINE GLUCONATE 0.12 % MT SOLN
15.0000 mL | Freq: Once | OROMUCOSAL | Status: AC
  Administered 2021-05-26: 15 mL via OROMUCOSAL

## 2021-05-26 MED ORDER — 0.9 % SODIUM CHLORIDE (POUR BTL) OPTIME
TOPICAL | Status: DC | PRN
Start: 2021-05-26 — End: 2021-05-26
  Administered 2021-05-26: 1000 mL

## 2021-05-26 MED ORDER — FENTANYL CITRATE (PF) 100 MCG/2ML IJ SOLN
INTRAMUSCULAR | Status: AC
  Filled 2021-05-26: qty 2

## 2021-05-26 MED ORDER — ORAL CARE MOUTH RINSE: 15.0000 mL | Freq: Once | OROMUCOSAL | Status: AC

## 2021-05-26 MED ORDER — BUPIVACAINE HCL 0.25 % IJ SOLN
INTRAMUSCULAR | Status: DC | PRN
  Administered 2021-05-26: 10 mL

## 2021-05-26 MED ORDER — PROPOFOL 10 MG/ML IV BOLUS
INTRAVENOUS | Status: AC
  Filled 2021-05-26: qty 20

## 2021-05-26 MED ORDER — CHLORHEXIDINE GLUCONATE CLOTH 2 % EX PADS: 6.0000 | MEDICATED_PAD | Freq: Once | CUTANEOUS | Status: DC

## 2021-05-26 MED ORDER — ACETAMINOPHEN 160 MG/5ML PO SOLN: 1000.0000 mg | Freq: Once | ORAL | Status: DC | PRN

## 2021-05-26 MED ORDER — EPHEDRINE 5 MG/ML INJ
INTRAVENOUS | Status: AC
  Filled 2021-05-26: qty 5

## 2021-05-26 MED ORDER — LIDOCAINE HCL (CARDIAC) PF 100 MG/5ML IV SOSY
PREFILLED_SYRINGE | INTRAVENOUS | Status: DC | PRN
  Administered 2021-05-26: 50 mg via INTRAVENOUS

## 2021-05-26 MED ORDER — DEXAMETHASONE SODIUM PHOSPHATE 10 MG/ML IJ SOLN
INTRAMUSCULAR | Status: AC
  Filled 2021-05-26: qty 1

## 2021-05-26 MED ORDER — CEFAZOLIN SODIUM-DEXTROSE 2-4 GM/100ML-% IV SOLN
2.0000 g | INTRAVENOUS | Status: AC
  Administered 2021-05-26: 2 g via INTRAVENOUS
  Filled 2021-05-26: qty 100

## 2021-05-26 MED ORDER — DEXAMETHASONE SODIUM PHOSPHATE 10 MG/ML IJ SOLN
INTRAMUSCULAR | Status: DC | PRN
  Administered 2021-05-26: 10 mg via INTRAVENOUS

## 2021-05-26 MED ORDER — LISINOPRIL 20 MG PO TABS
20.0000 mg | ORAL_TABLET | Freq: Once | ORAL | Status: AC
  Administered 2021-05-26: 20 mg via ORAL
  Filled 2021-05-26: qty 1

## 2021-05-26 MED ORDER — PROPOFOL 10 MG/ML IV BOLUS
INTRAVENOUS | Status: DC | PRN
  Administered 2021-05-26: 90 mg via INTRAVENOUS

## 2021-05-26 MED ORDER — TRAMADOL HCL 50 MG PO TABS: 50.0000 mg | ORAL_TABLET | Freq: Four times a day (QID) | ORAL | 0 refills | Status: DC | PRN

## 2021-05-26 MED ORDER — FENTANYL CITRATE (PF) 100 MCG/2ML IJ SOLN
INTRAMUSCULAR | Status: DC | PRN
  Administered 2021-05-26 (×2): 25 ug via INTRAVENOUS

## 2021-05-26 MED ORDER — LACTATED RINGERS IV SOLN: INTRAVENOUS | Status: DC

## 2021-05-26 MED ORDER — LIDOCAINE HCL (PF) 2 % IJ SOLN
INTRAMUSCULAR | Status: AC
  Filled 2021-05-26: qty 5

## 2021-05-26 MED ORDER — ACETAMINOPHEN 10 MG/ML IV SOLN: 1000.0000 mg | Freq: Once | INTRAVENOUS | Status: DC | PRN

## 2021-05-26 MED ORDER — ROCURONIUM BROMIDE 10 MG/ML (PF) SYRINGE
PREFILLED_SYRINGE | INTRAVENOUS | Status: DC | PRN
  Administered 2021-05-26: 50 mg via INTRAVENOUS

## 2021-05-26 MED ORDER — BUPIVACAINE HCL 0.25 % IJ SOLN
INTRAMUSCULAR | Status: AC
  Filled 2021-05-26: qty 1

## 2021-05-26 MED ORDER — ACETAMINOPHEN 500 MG PO TABS: 1000.0000 mg | ORAL_TABLET | Freq: Once | ORAL | Status: DC | PRN

## 2021-05-26 MED ORDER — ONDANSETRON HCL 4 MG/2ML IJ SOLN
INTRAMUSCULAR | Status: DC | PRN
  Administered 2021-05-26: 4 mg via INTRAVENOUS

## 2021-05-26 MED ORDER — FENTANYL CITRATE PF 50 MCG/ML IJ SOSY: 25.0000 ug | PREFILLED_SYRINGE | INTRAMUSCULAR | Status: DC | PRN

## 2021-05-26 MED ORDER — ROCURONIUM BROMIDE 10 MG/ML (PF) SYRINGE
PREFILLED_SYRINGE | INTRAVENOUS | Status: AC
  Filled 2021-05-26: qty 10

## 2021-05-26 MED ORDER — HEMOSTATIC AGENTS (NO CHARGE) OPTIME
TOPICAL | Status: DC | PRN
  Administered 2021-05-26: 1 via TOPICAL

## 2021-05-26 SURGICAL SUPPLY — 39 items
ADH SKN CLS APL DERMABOND .7 (GAUZE/BANDAGES/DRESSINGS) ×1
APL PRP STRL LF DISP 70% ISPRP (MISCELLANEOUS) ×1
ATTRACTOMAT 16X20 MAGNETIC DRP (DRAPES) ×2 IMPLANT
BAG COUNTER SPONGE SURGICOUNT (BAG) ×2 IMPLANT
BAG SPNG CNTER NS LX DISP (BAG) ×1
BLADE SURG 15 STRL LF DISP TIS (BLADE) ×1 IMPLANT
BLADE SURG 15 STRL SS (BLADE) ×2
CHLORAPREP W/TINT 26 (MISCELLANEOUS) ×2 IMPLANT
CLIP TI MEDIUM 6 (CLIP) ×4 IMPLANT
CLIP TI WIDE RED SMALL 6 (CLIP) ×4 IMPLANT
COVER SURGICAL LIGHT HANDLE (MISCELLANEOUS) ×2 IMPLANT
DERMABOND ADVANCED (GAUZE/BANDAGES/DRESSINGS) ×1
DERMABOND ADVANCED .7 DNX12 (GAUZE/BANDAGES/DRESSINGS) ×1 IMPLANT
DRAPE LAPAROTOMY T 98X78 PEDS (DRAPES) ×2 IMPLANT
DRAPE UTILITY XL STRL (DRAPES) ×2 IMPLANT
ELECT REM PT RETURN 15FT ADLT (MISCELLANEOUS) ×2 IMPLANT
GAUZE 4X4 16PLY ~~LOC~~+RFID DBL (SPONGE) ×2 IMPLANT
GLOVE SURG ORTHO 8.0 STRL STRW (GLOVE) ×2 IMPLANT
GLOVE SURG SYN 7.5  E (GLOVE) ×6
GLOVE SURG SYN 7.5 E (GLOVE) ×3 IMPLANT
GLOVE SURG SYN 7.5 PF PI (GLOVE) ×3 IMPLANT
GOWN STRL REUS W/ TWL XL LVL3 (GOWN DISPOSABLE) ×3 IMPLANT
GOWN STRL REUS W/TWL XL LVL3 (GOWN DISPOSABLE) ×6
HEMOSTAT SURGICEL 2X4 FIBR (HEMOSTASIS) ×2 IMPLANT
ILLUMINATOR WAVEGUIDE N/F (MISCELLANEOUS) IMPLANT
KIT BASIN OR (CUSTOM PROCEDURE TRAY) ×2 IMPLANT
KIT TURNOVER KIT A (KITS) ×1 IMPLANT
NDL HYPO 25X1 1.5 SAFETY (NEEDLE) ×1 IMPLANT
NEEDLE HYPO 25X1 1.5 SAFETY (NEEDLE) ×2 IMPLANT
PACK BASIC VI WITH GOWN DISP (CUSTOM PROCEDURE TRAY) ×2 IMPLANT
PENCIL SMOKE EVACUATOR (MISCELLANEOUS) ×2 IMPLANT
SHEARS HARMONIC 9CM CVD (BLADE) ×1 IMPLANT
SUT MNCRL AB 4-0 PS2 18 (SUTURE) ×2 IMPLANT
SUT VIC AB 3-0 SH 18 (SUTURE) ×2 IMPLANT
SYR BULB IRRIG 60ML STRL (SYRINGE) ×2 IMPLANT
SYR CONTROL 10ML LL (SYRINGE) ×2 IMPLANT
TOWEL OR 17X26 10 PK STRL BLUE (TOWEL DISPOSABLE) ×2 IMPLANT
TOWEL OR NON WOVEN STRL DISP B (DISPOSABLE) ×2 IMPLANT
TUBING CONNECTING 10 (TUBING) ×2 IMPLANT

## 2021-05-26 NOTE — Anesthesia Postprocedure Evaluation (Signed)
Anesthesia Post Note ? ?Patient: Sandy Trujillo ? ?Procedure(s) Performed: NECK EXPLORATION WITH RIGHT INFERIOR PARATHYROIDECTOMY (Neck) ? ?  ? ?Patient location during evaluation: PACU ?Anesthesia Type: General ?Level of consciousness: awake and alert ?Pain management: pain level controlled ?Vital Signs Assessment: post-procedure vital signs reviewed and stable ?Respiratory status: spontaneous breathing, nonlabored ventilation, respiratory function stable and patient connected to nasal cannula oxygen ?Cardiovascular status: blood pressure returned to baseline and stable ?Postop Assessment: no apparent nausea or vomiting ?Anesthetic complications: no ? ? ?No notable events documented. ? ?Last Vitals:  ?Vitals:  ? 05/26/21 0915 05/26/21 0931  ?BP: (!) 189/85 (!) 198/61  ?Pulse: (!) 59 (!) 58  ?Resp:  20  ?Temp:  (!) 36.4 ?C  ?SpO2: 99% 100%  ?  ?Last Pain:  ?Vitals:  ? 05/26/21 0931  ?TempSrc: Oral  ?PainSc: 0-No pain  ? ? ?  ?  ?  ?  ?  ?  ? ?Jakita Dutkiewicz ? ? ? ? ?

## 2021-05-26 NOTE — Discharge Instructions (Addendum)
[facility] - Dr. Todd Gerkin  THYROID & PARATHYROID SURGERY:  POST-OP INSTRUCTIONS  Always review the instruction sheet provided by the hospital nurse at discharge.  A prescription for pain medication may be sent to your pharmacy at the time of discharge.  Take your pain medication as prescribed.  If narcotic pain medicine is not needed, then you may take acetaminophen (Tylenol) or ibuprofen (Advil) as needed for pain or soreness.  Take your normal home medications as prescribed unless otherwise directed.  If you need a refill on your pain medication, please contact the office during regular business hours.  Prescriptions will not be processed by the office after 5:00PM or on weekends.  Start with a light diet upon arrival home, such as soup and crackers or toast.  Be sure to drink plenty of fluids.  Resume your normal diet the day after surgery.  Most patients will experience some swelling and bruising on the chest and neck area.  Ice packs will help for the first 48 hours after arriving home.  Swelling and bruising will take several days to resolve.   It is common to experience some constipation after surgery.  Increasing fluid intake and taking a stool softener (Colace) will usually help to prevent this problem.  A mild laxative (Milk of Magnesia or Miralax) should be taken according to package directions if there has been no bowel movement after 48 hours.  Dermabond glue covers your incision. This seals the wound and you may shower at any time. The Dermabond will remain in place for about a week.  You may gradually remove the glue when it loosens around the edges.  If you need to loosen the Dermabond for removal, apply a layer of Vaseline to the wound for 15 minutes and then remove with a Kleenex. Your sutures are under the skin and will not show - they will dissolve on their own.  You may resume light daily activities beginning the day after discharge (such as self-care,  walking, climbing stairs), gradually increasing activities as tolerated. You may have sexual intercourse when it is comfortable. Refrain from any heavy lifting or straining until approved by your doctor. You may drive when you no longer are taking prescription pain medication, you can comfortably wear a seatbelt, and you can safely maneuver your car and apply the brakes.  You will see your doctor in the office for a follow-up appointment approximately three weeks after your surgery.  Make sure that you call for this appointment within a day or two after you arrive home to insure a convenient appointment time. Please have any requested laboratory tests performed a few days prior to your office visit so that the results will be available at your follow up appointment.  WHEN TO CALL THE [facility] OFFICE: -- Fever greater than 101.5 -- Inability to urinate -- Nausea and/or vomiting - persistent -- Extreme swelling or bruising -- Continued bleeding from incision -- Increased pain, redness, or drainage from the incision -- Difficulty swallowing or breathing -- Muscle cramping or spasms -- Numbness or tingling in hands or around lips  The clinic staff is available to answer your questions during regular business hours.  Please don't hesitate to call and ask to speak to one of the nurses if you have concerns.  [facility] OFFICE: [phone] (24 hours)  Please sign up for MyChart accounts. This will allow you to communicate directly with my nurse or myself without having to call the office. It will also allow you   to view your test results. You will need to enroll in MyChart for my office ([facility]) and for the hospital ([place]).  Todd Gerkin, MD [facility] A [facility] practice 

## 2021-05-26 NOTE — Interval H&P Note (Signed)
History and Physical Interval Note: ? ?05/26/2021 ?7:08 AM ? ?Sandy Trujillo  has presented today for surgery, with the diagnosis of PRIMARY HYPERPARATHYROIDISM.  The various methods of treatment have been discussed with the patient and family. After consideration of risks, benefits and other options for treatment, the patient has consented to ? ?  Procedure(s): ?NECK EXPLORATION WITH PARATHYROIDECTOMY (N/A) as a surgical intervention.   ? ?The patient's history has been reviewed, patient examined, no change in status, stable for surgery.  I have reviewed the patient's chart and labs.  Questions were answered to the patient's satisfaction.   ? ?Armandina Gemma, MD ?[facility] ?A [facility] practice ?Office: [phone] ? ? ?Armandina Gemma ? ? ?

## 2021-05-26 NOTE — Transfer of Care (Signed)
Immediate Anesthesia Transfer of Care Note ? ?Patient: Sandy Trujillo ? ?Procedure(s) Performed: NECK EXPLORATION WITH RIGHT INFERIOR PARATHYROIDECTOMY (Neck) ? ?Patient Location: PACU ? ?Anesthesia Type:General ? ?Level of Consciousness: sedated ? ?Airway & Oxygen Therapy: Patient Spontanous Breathing and Patient connected to face mask oxygen ? ?Post-op Assessment: Report given to RN and Post -op Vital signs reviewed and stable ? ?Post vital signs: Reviewed and stable ? ?Last Vitals:  ?Vitals Value Taken Time  ?BP 120/95 05/26/21 0842  ?Temp    ?Pulse 58 05/26/21 0843  ?Resp 20 05/26/21 0843  ?SpO2 100 % 05/26/21 0843  ?Vitals shown include unvalidated device data. ? ?Last Pain:  ?Vitals:  ? 05/26/21 0608  ?TempSrc:   ?PainSc: 0-No pain  ?   ? ?  ? ?Complications: No notable events documented. ?

## 2021-05-26 NOTE — Anesthesia Procedure Notes (Signed)
Procedure Name: Intubation ?Date/Time: 05/26/2021 7:26 AM ?Performed by: Lind Covert, CRNA ?Pre-anesthesia Checklist: Patient identified, Emergency Drugs available, Suction available, Patient being monitored and Timeout performed ?Patient Re-evaluated:Patient Re-evaluated prior to induction ?Oxygen Delivery Method: Circle system utilized ?Preoxygenation: Pre-oxygenation with 100% oxygen ?Induction Type: IV induction ?Ventilation: Mask ventilation without difficulty ?Laryngoscope Size: Mac and 3 ?Grade View: Grade II ?Tube type: Oral ?Tube size: 6.5 mm ?Number of attempts: 1 ?Airway Equipment and Method: Stylet ?Placement Confirmation: ETT inserted through vocal cords under direct vision, positive ETCO2 and breath sounds checked- equal and bilateral ?Secured at: 22 cm ?Tube secured with: Tape ?Dental Injury: Teeth and Oropharynx as per pre-operative assessment  ? ? ? ? ?

## 2021-05-26 NOTE — Anesthesia Preprocedure Evaluation (Signed)
Anesthesia Evaluation  ?Patient identified by MRN, date of birth, ID band ?Patient awake ? ? ? ?Reviewed: ?Allergy & Precautions, NPO status , Patient's Chart, lab work & pertinent test results ? ?History of Anesthesia Complications ?Negative for: history of anesthetic complications ? ?Airway ?Mallampati: I ? ?TM Distance: >3 FB ?Neck ROM: Full ? ? ? Dental ? ?(+) Teeth Intact, Dental Advisory Given ?  ?Pulmonary ? ?  ?breath sounds clear to auscultation ? ? ? ? ? ? Cardiovascular ?hypertension, Pt. on medications ?(-) angina(-) Past MI  ?Rhythm:Regular  ? ?  ?Neuro/Psych ?PSYCHIATRIC DISORDERS Anxiety negative neurological ROS ?   ? GI/Hepatic ?Neg liver ROS, PUD, GERD  ,  ?Endo/Other  ?PRIMARY HYPERPARATHYROIDISM ? ?Lab Results ?     Component                Value               Date                 ?     NA                       129 (L)             05/19/2021           ?     K                        4.8                 05/19/2021           ?     CO2                      27                  05/19/2021           ?     GLUCOSE                  101 (H)             05/19/2021           ?     BUN                      23                  05/19/2021           ?     CREATININE               0.80                05/19/2021           ?     CALCIUM                  11.0 (H)            05/19/2021           ?     GFRNONAA                 >60                 05/19/2021           ? ? ? Renal/GU ?Lab Results ?  Component                Value               Date                 ?     CREATININE               0.80                05/19/2021           ?  ? ?  ?Musculoskeletal ? ? Abdominal ?  ?Peds ? Hematology ? ?(+) Blood dyscrasia, anemia , Lab Results ?     Component                Value               Date                 ?     WBC                      8.0                 05/19/2021           ?     HGB                      11.6 (L)            05/19/2021           ?     HCT                       36.4                05/19/2021           ?     MCV                      90.3                05/19/2021           ?     PLT                      302                 05/19/2021           ?   ?Anesthesia Other Findings ? ? Reproductive/Obstetrics ? ?  ? ? ? ? ? ? ? ? ? ? ? ? ? ?  ?  ? ? ? ? ? ? ? ? ?Anesthesia Physical ?Anesthesia Plan ? ?ASA: 2 ? ?Anesthesia Plan: General  ? ?Post-op Pain Management: Minimal or no pain anticipated and Ofirmev IV (intra-op)*  ? ?Induction: Intravenous ? ?PONV Risk Score and Plan: 3 and Ondansetron and Dexamethasone ? ?Airway Management Planned: LMA and Oral ETT ? ?Additional Equipment: None ? ?Intra-op Plan:  ? ?Post-operative Plan: Extubation in OR ? ?Informed Consent: I have reviewed the patients History and Physical, chart, labs and discussed the procedure including the risks, benefits and alternatives for the proposed anesthesia with the patient or authorized representative who has indicated his/her understanding and acceptance.  ? ? ? ?Dental advisory given ? ?Plan Discussed with: CRNA ? ?Anesthesia Plan Comments:   ? ? ? ? ? ? ?  Anesthesia Quick Evaluation ? ?

## 2021-05-26 NOTE — H&P (Signed)
? ? ? ?REFERRING PHYSICIAN: Buddy Duty, MD ? ?PROVIDER: Heloise Gordan Charlotta Newton, MD ? ?Chief Complaint: New Consultation (Hypercalcemia, possible primary hyperparathyroidism) ? ? ?History of Present Illness: ? ?Patient is referred by Dr. Delrae Rend for surgical evaluation and recommendations regarding suspected primary hyperparathyroidism. Patient has a longstanding history of hypercalcemia. Her most recent level in December 2022 was markedly elevated at 11.9. 24-hour urine collection for calcium was normal at 104. Patient has a history of hypercalciuria. Bone density scan demonstrates osteopenia. Patient has had no recent fractures. She does note moderate fatigue. She denies nephrolithiasis. Patient has had previous imaging studies including an ultrasound and a nuclear medicine parathyroid scan, both of these performed in 2018 and 2019. Both studies were negative at that time for parathyroid adenoma. Patient has had no prior head or neck surgery. There is a family history of thyroid cancer in the patient's son, who is a patient of mine. Patient is accompanied today by her daughter. ? ?Review of Systems: ?A complete review of systems was obtained from the patient. I have reviewed this information and discussed as appropriate with the patient. See HPI as well for other ROS. ? ?Review of Systems  ?Constitutional: Positive for malaise/fatigue.  ?HENT: Negative.  ?Eyes: Negative.  ?Respiratory: Negative.  ?Cardiovascular: Negative.  ?Gastrointestinal: Negative.  ?Genitourinary: Negative.  ?Musculoskeletal: Positive for joint pain.  ?Skin: Negative.  ?Neurological: Negative.  ?Endo/Heme/Allergies: Negative.  ?Psychiatric/Behavioral: Negative.  ? ? ?Medical History: ?Past Medical History:  ?Diagnosis Date  ? Anemia  ? History of cancer  ? Hypertension  ? ?Patient Active Problem List  ?Diagnosis  ? Hypercalcemia, familial hypocalciuric  ? ?Past Surgical History:  ?Procedure Laterality Date  ? COLON SURGERY  ? ? ?No Known  Allergies ? ?Current Outpatient Medications on File Prior to Visit  ?Medication Sig Dispense Refill  ? ALPRAZolam (XANAX) 0.25 MG tablet 1/2 to 1 tablet  ? DILT-XR 120 mg XR capsule TAKE 1 CAPSULE BY MOUTH EVERY DAY FOR 30 DAYS  ? ferrous sulfate 325 (65 FE) MG tablet 1 tablet  ? lisinopriL (ZESTRIL) 5 MG tablet Take by mouth  ? loperamide (IMODIUM) 2 mg capsule Take by mouth  ? multivitamin tablet Take 1 tablet by mouth once daily  ? VITAMIN B COMPLEX ORAL Take 1 tablet by mouth once daily  ? zoledronic acid (RECLAST) 5 mg/100 mL injection 5 mg  ? ?No current facility-administered medications on file prior to visit.  ? ?Family History  ?Problem Relation Age of Onset  ? High blood pressure (Hypertension) Mother  ? High blood pressure (Hypertension) Sister  ? Obesity Brother  ? High blood pressure (Hypertension) Brother  ? ? ?Social History  ? ?Tobacco Use  ?Smoking Status Never  ?Smokeless Tobacco Never  ? ? ?Social History  ? ?Socioeconomic History  ? Marital status: Widowed  ?Tobacco Use  ? Smoking status: Never  ? Smokeless tobacco: Never  ?Vaping Use  ? Vaping Use: Never used  ?Substance and Sexual Activity  ? Alcohol use: Yes  ? Drug use: Never  ? ?Objective:  ? ?Vitals:  ?BP: (!) 152/82  ?Pulse: 59  ?Temp: 36.4 ?C (97.6 ?F)  ?SpO2: 98%  ?Weight: 64.3 kg (141 lb 12.8 oz)  ?Height: 152.4 cm (5')  ? ?Body mass index is 27.69 kg/m?. ? ?Physical Exam  ? ?GENERAL APPEARANCE ?Development: normal ?Nutritional status: normal ?Gross deformities: none ? ?SKIN ?Rash, lesions, ulcers: none ?Induration, erythema: none ?Nodules: none palpable ? ?EYES ?Conjunctiva and lids: normal ?Pupils: equal and reactive ?  Iris: normal bilaterally ? ?EARS, NOSE, MOUTH, THROAT ?External ears: no lesion or deformity ?External nose: no lesion or deformity ?Hearing: grossly normal ?Due to Covid-19 pandemic, patient is wearing a mask. ? ?NECK ?Symmetric: yes ?Trachea: midline ?Thyroid: no palpable nodules in the thyroid  bed ? ?CHEST ?Respiratory effort: normal ?Retraction or accessory muscle use: no ?Breath sounds: normal bilaterally ?Rales, rhonchi, wheeze: none ? ?CARDIOVASCULAR ?Auscultation: regular rhythm, normal rate ?Murmurs: none ?Pulses: radial pulse 2+ palpable ?Lower extremity edema: none ? ?MUSCULOSKELETAL ?Station and gait: normal ?Digits and nails: no clubbing or cyanosis ?Muscle strength: grossly normal all extremities ?Range of motion: grossly normal all extremities ?Deformity: none ? ?LYMPHATIC ?Cervical: none palpable ?Supraclavicular: none palpable ? ?PSYCHIATRIC ?Oriented to person, place, and time: yes ?Mood and affect: normal for situation ?Judgment and insight: appropriate for situation ? ? ?Assessment and Plan:  ? ?Hypercalcemia ? ?Hypercalcemia, familial hypocalciuric ? ?Patient is referred by her endocrinologist for surgical assessment for possible primary hyperparathyroidism. ? ?Patient provided with a copy of "Parathyroid Surgery: Treatment for Your Parathyroid Gland Problem", published by [employer], 12 pages. Book reviewed and explained to patient during visit today. ? ?Patient has a longstanding history of hypercalcemia. Previous imaging studies performed 4 to 5 years ago failed to identify evidence of a parathyroid adenoma. I would like to repeat her imaging studies to include a nuclear medicine parathyroid scan and an ultrasound examination. We will make arrangements for the studies in the near future. We will also repeat her laboratory studies at a different reference laboratory, obtaining a calcium level, intact PTH level, and 25-hydroxy vitamin D level. I will contact the patient with the results of all of the studies when they are available. If they failed to identify a parathyroid adenoma, we will consider proceeding with a 4D CT scan of the neck in hopes of localizing the adenoma and making the patient a good candidate for minimally invasive outpatient surgery which we discussed briefly  today. ? ?Patient and her daughter understand the above plan and agree to proceed. We will make arrangements for her studies in the near future.  ? ?Armandina Gemma, MD ?[facility] ?A [facility] practice ?Office: [phone] ? ?

## 2021-05-26 NOTE — Op Note (Signed)
Operative Note ? ?Pre-operative Diagnosis:  primary hyperparathyroidism  ? ?Post-operative Diagnosis:  same ? ?Surgeon:  Armandina Gemma, MD ? ?Assistant:  none  ? ?Procedure:  neck exploration and parathyroidectomy (right inferior gland) ? ?Anesthesia:  general ? ?Estimated Blood Loss:  minimal ? ?Drains: none ?        ?Specimen: right inferior parathyroid to pathology ? ?Indications:  Patient is referred by Dr. Delrae Rend for surgical evaluation and recommendations regarding suspected primary hyperparathyroidism. Patient has a longstanding history of hypercalcemia. Her most recent level in December 2022 was markedly elevated at 11.9. 24-hour urine collection for calcium was normal at 104. Patient has a history of hypercalciuria. Bone density scan demonstrates osteopenia. Patient has had no recent fractures. She does note moderate fatigue. She denies nephrolithiasis. Patient has had previous imaging studies including an ultrasound and a nuclear medicine parathyroid scan, both of these performed in 2018 and 2019. Both studies were negative at that time for parathyroid adenoma.  Repeat imaging studies were performed including an ultrasound, nuclear medicine parathyroid scan, and 4D CT scan of the neck.  The studies were negative with the exception of the CT scan which suggested a possible right posterior parathyroid adenoma.  Patient now comes to surgery for neck exploration. ? ?Procedure:  The patient was seen in the pre-op holding area. The risks, benefits, complications, treatment options, and expected outcomes were previously discussed with the patient. The patient agreed with the proposed plan and has signed the informed consent form.  The patient was brought to the operating room by the surgical team, identified as Sandy Trujillo and the procedure verified. A "time out" was completed and the above information confirmed. ? ?Following administration of general anesthesia, the patient was positioned and then  prepped and draped in the usual aseptic fashion.  After ascertaining that an adequate level of anesthesia been achieved, a small Kocher incision was made with a #15 blade.  Dissection was carried through subcutaneous tissues and the platysma.  Subplatysmal flaps were developed cephalad and caudad.  Self-retaining retractors were placed for exposure. ? ?Strap muscles were incised in the midline and the left side of the neck was explored initially.  Strap muscles were reflected laterally.  Left thyroid lobe appeared normal.  Exploration reveals a normal left inferior parathyroid gland.  Exploration of the superior field revealed no evidence of enlarged parathyroid gland. ? ?Next we explored the right side.  Strap muscles were reflected laterally.  Right thyroid lobe was moderately enlarged and had a nodule in the inferior pole.  Exploration with mobilization of the right lobe reveals an enlarged parathyroid gland just below the level of the inferior thyroid artery.  This gland is gently mobilized using the harmonic scalpel for dissection and hemostasis.  Care is taken to avoid the surrounding structures.  The entire gland is excised.  It measures approximately 1.5 cm in greatest dimension.  It is submitted to pathology where frozen section biopsy confirms hypercellular parathyroid tissue consistent with adenoma.  Further dissection on the right side in the superior field or fails to reveal any evidence of enlarged parathyroid tissue. ? ?Neck is irrigated with warm saline.  Fibrillar is placed throughout the operative field.  Good hemostasis is noted.  Strap muscles are reapproximated in the midline of interrupted 3-0 Vicryl sutures.  Platysma was closed with interrupted 3-0 Vicryl sutures.  Skin is anesthetized with local anesthetic.  Skin incision is closed with a running 4-0 Monocryl subcuticular suture.  Wound is washed and  dried and Dermabond is applied as dressing. ? ?Patient is awakened from anesthesia and  transported to the recovery room.  The patient tolerated the procedure well. ? ? ?Armandina Gemma, MD ?[facility] ?Office: [phone] ?  ?

## 2021-05-27 ENCOUNTER — Encounter (HOSPITAL_COMMUNITY): Payer: Self-pay | Admitting: Surgery

## 2021-05-27 DIAGNOSIS — Z20822 Contact with and (suspected) exposure to covid-19: Secondary | ICD-10-CM | POA: Diagnosis not present

## 2021-05-27 LAB — SURGICAL PATHOLOGY

## 2021-06-09 DIAGNOSIS — I10 Essential (primary) hypertension: Secondary | ICD-10-CM | POA: Diagnosis not present

## 2021-06-11 DIAGNOSIS — E892 Postprocedural hypoparathyroidism: Secondary | ICD-10-CM | POA: Diagnosis not present

## 2021-06-11 DIAGNOSIS — B0229 Other postherpetic nervous system involvement: Secondary | ICD-10-CM | POA: Diagnosis not present

## 2021-07-21 DIAGNOSIS — M109 Gout, unspecified: Secondary | ICD-10-CM | POA: Diagnosis not present

## 2021-07-21 DIAGNOSIS — I10 Essential (primary) hypertension: Secondary | ICD-10-CM | POA: Diagnosis not present

## 2021-08-11 DIAGNOSIS — Z79899 Other long term (current) drug therapy: Secondary | ICD-10-CM | POA: Diagnosis not present

## 2021-08-11 DIAGNOSIS — I10 Essential (primary) hypertension: Secondary | ICD-10-CM | POA: Diagnosis not present

## 2021-11-07 DIAGNOSIS — Z23 Encounter for immunization: Secondary | ICD-10-CM | POA: Diagnosis not present

## 2021-11-19 ENCOUNTER — Ambulatory Visit (INDEPENDENT_AMBULATORY_CARE_PROVIDER_SITE_OTHER): Payer: Medicare Other | Admitting: Orthopaedic Surgery

## 2021-11-19 ENCOUNTER — Encounter: Payer: Self-pay | Admitting: Orthopaedic Surgery

## 2021-11-19 ENCOUNTER — Ambulatory Visit (INDEPENDENT_AMBULATORY_CARE_PROVIDER_SITE_OTHER): Payer: Medicare Other

## 2021-11-19 DIAGNOSIS — M25572 Pain in left ankle and joints of left foot: Secondary | ICD-10-CM | POA: Diagnosis not present

## 2021-11-19 NOTE — Progress Notes (Signed)
The patient is an 88 year old female who comes in with acute left ankle pain.  She actually sustained a significant fracture to her left ankle over 30 years ago that required surgery.  She has had no new injury and the ankle swells off and on on occasion.  Recently she did ride her exercise bike and her ankle was hurting more since then.  She does wear appropriate comfortable firm shoes.  She does ambulate the cane in her opposite right hand.  She is going on a cruise later in November and needed to have this ankle checked out to make sure everything is okay.  When I examined both ankles together there is obvious deformity of the left ankle with chronic swelling and well-healed medial and lateral surgical incisions.  She does have flexion extension of the ankle but it is significant limited compared to her noninjured right ankle.  Her feet are well-perfused with normal sensation.  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  There is intact hardware medial and lateral.  The joint space is significantly more narrow with osteophytes around the ankle joint.  I did share with her and her daughter the x-rays of her ankle.  I have recommended just an ankle sleeve to wear from time to time and occasional topical and oral anti-inflammatories.  If she is still having some pain before her trip, she should not hesitate to get worked into the office so we can provide a steroid injection in her ankle joint if needed.  All questions and concerns were answered and addressed.

## 2021-12-01 DIAGNOSIS — I10 Essential (primary) hypertension: Secondary | ICD-10-CM | POA: Diagnosis not present

## 2021-12-01 DIAGNOSIS — M109 Gout, unspecified: Secondary | ICD-10-CM | POA: Diagnosis not present

## 2021-12-01 DIAGNOSIS — Z432 Encounter for attention to ileostomy: Secondary | ICD-10-CM | POA: Diagnosis not present

## 2021-12-01 DIAGNOSIS — F411 Generalized anxiety disorder: Secondary | ICD-10-CM | POA: Diagnosis not present

## 2021-12-07 ENCOUNTER — Ambulatory Visit: Payer: Medicare Other | Admitting: Orthopaedic Surgery

## 2021-12-07 DIAGNOSIS — Z23 Encounter for immunization: Secondary | ICD-10-CM | POA: Diagnosis not present

## 2022-01-21 DIAGNOSIS — I10 Essential (primary) hypertension: Secondary | ICD-10-CM | POA: Diagnosis not present

## 2022-01-21 DIAGNOSIS — Z432 Encounter for attention to ileostomy: Secondary | ICD-10-CM | POA: Diagnosis not present

## 2022-01-21 DIAGNOSIS — F419 Anxiety disorder, unspecified: Secondary | ICD-10-CM | POA: Diagnosis not present

## 2022-02-15 DIAGNOSIS — F411 Generalized anxiety disorder: Secondary | ICD-10-CM | POA: Diagnosis not present

## 2022-02-24 DIAGNOSIS — F411 Generalized anxiety disorder: Secondary | ICD-10-CM | POA: Diagnosis not present

## 2022-03-04 DIAGNOSIS — F411 Generalized anxiety disorder: Secondary | ICD-10-CM | POA: Diagnosis not present

## 2022-03-25 DIAGNOSIS — F411 Generalized anxiety disorder: Secondary | ICD-10-CM | POA: Diagnosis not present

## 2022-03-26 DIAGNOSIS — F411 Generalized anxiety disorder: Secondary | ICD-10-CM | POA: Diagnosis not present

## 2022-04-25 DIAGNOSIS — F411 Generalized anxiety disorder: Secondary | ICD-10-CM | POA: Diagnosis not present

## 2022-04-27 DIAGNOSIS — F411 Generalized anxiety disorder: Secondary | ICD-10-CM | POA: Diagnosis not present

## 2022-05-09 ENCOUNTER — Emergency Department (HOSPITAL_BASED_OUTPATIENT_CLINIC_OR_DEPARTMENT_OTHER): Payer: Medicare Other

## 2022-05-09 ENCOUNTER — Encounter (HOSPITAL_COMMUNITY): Payer: Self-pay | Admitting: *Deleted

## 2022-05-09 ENCOUNTER — Emergency Department (HOSPITAL_COMMUNITY): Payer: Medicare Other

## 2022-05-09 ENCOUNTER — Observation Stay (HOSPITAL_COMMUNITY)
Admission: EM | Admit: 2022-05-09 | Discharge: 2022-05-10 | Disposition: A | Discharge status: Home / Self Care | Payer: Medicare Other | Attending: Family Medicine | Admitting: Family Medicine

## 2022-05-09 ENCOUNTER — Encounter (HOSPITAL_COMMUNITY): Payer: Medicare Other

## 2022-05-09 ENCOUNTER — Other Ambulatory Visit: Payer: Self-pay

## 2022-05-09 DIAGNOSIS — J9811 Atelectasis: Secondary | ICD-10-CM | POA: Diagnosis not present

## 2022-05-09 DIAGNOSIS — F411 Generalized anxiety disorder: Secondary | ICD-10-CM

## 2022-05-09 DIAGNOSIS — Z85048 Personal history of other malignant neoplasm of rectum, rectosigmoid junction, and anus: Secondary | ICD-10-CM | POA: Diagnosis not present

## 2022-05-09 DIAGNOSIS — D649 Anemia, unspecified: Secondary | ICD-10-CM

## 2022-05-09 DIAGNOSIS — J90 Pleural effusion, not elsewhere classified: Secondary | ICD-10-CM

## 2022-05-09 DIAGNOSIS — I10 Essential (primary) hypertension: Secondary | ICD-10-CM

## 2022-05-09 DIAGNOSIS — Z932 Ileostomy status: Secondary | ICD-10-CM

## 2022-05-09 DIAGNOSIS — M6281 Muscle weakness (generalized): Secondary | ICD-10-CM | POA: Diagnosis not present

## 2022-05-09 DIAGNOSIS — I480 Paroxysmal atrial fibrillation: Secondary | ICD-10-CM | POA: Diagnosis not present

## 2022-05-09 DIAGNOSIS — M7989 Other specified soft tissue disorders: Secondary | ICD-10-CM | POA: Diagnosis not present

## 2022-05-09 DIAGNOSIS — R2689 Other abnormalities of gait and mobility: Secondary | ICD-10-CM | POA: Diagnosis not present

## 2022-05-09 DIAGNOSIS — I4891 Unspecified atrial fibrillation: Secondary | ICD-10-CM

## 2022-05-09 DIAGNOSIS — Z79899 Other long term (current) drug therapy: Secondary | ICD-10-CM | POA: Diagnosis not present

## 2022-05-09 DIAGNOSIS — R609 Edema, unspecified: Secondary | ICD-10-CM | POA: Diagnosis not present

## 2022-05-09 DIAGNOSIS — R0789 Other chest pain: Secondary | ICD-10-CM | POA: Diagnosis not present

## 2022-05-09 DIAGNOSIS — R079 Chest pain, unspecified: Secondary | ICD-10-CM

## 2022-05-09 DIAGNOSIS — D6869 Other thrombophilia: Secondary | ICD-10-CM | POA: Insufficient documentation

## 2022-05-09 DIAGNOSIS — J811 Chronic pulmonary edema: Secondary | ICD-10-CM | POA: Diagnosis not present

## 2022-05-09 LAB — MAGNESIUM: Magnesium: 1.8 mg/dL (ref 1.7–2.4)

## 2022-05-09 LAB — COMPREHENSIVE METABOLIC PANEL
ALT: 45 U/L — ABNORMAL HIGH (ref 0–44)
AST: 43 U/L — ABNORMAL HIGH (ref 15–41)
Albumin: 3.5 g/dL (ref 3.5–5.0)
Alkaline Phosphatase: 88 U/L (ref 38–126)
Anion gap: 9 (ref 5–15)
BUN: 23 mg/dL (ref 8–23)
CO2: 27 mmol/L (ref 22–32)
Calcium: 9.6 mg/dL (ref 8.9–10.3)
Chloride: 95 mmol/L — ABNORMAL LOW (ref 98–111)
Creatinine, Ser: 0.74 mg/dL (ref 0.44–1.00)
GFR, Estimated: >60 mL/min (ref >60)
Glucose, Bld: 107 mg/dL — ABNORMAL HIGH (ref 70–99)
Potassium: 4.4 mmol/L (ref 3.5–5.1)
Sodium: 131 mmol/L — ABNORMAL LOW (ref 135–145)
Total Bilirubin: 0.7 mg/dL (ref 0.3–1.2)
Total Protein: 7.2 g/dL (ref 6.5–8.1)

## 2022-05-09 LAB — CBC
HCT: 30.1 % — ABNORMAL LOW (ref 36.0–46.0)
Hemoglobin: 9.9 g/dL — ABNORMAL LOW (ref 12.0–15.0)
MCH: 27.3 pg (ref 26.0–34.0)
MCHC: 32.9 g/dL (ref 30.0–36.0)
MCV: 83.1 fL (ref 80.0–100.0)
Platelets: 220 10*3/uL (ref 150–400)
RBC: 3.62 MIL/uL — ABNORMAL LOW (ref 3.87–5.11)
RDW: 15.3 % (ref 11.5–15.5)
WBC: 4.6 10*3/uL (ref 4.0–10.5)
nRBC: 0 % (ref 0.0–0.2)

## 2022-05-09 LAB — TSH: TSH: 2.428 u[IU]/mL (ref 0.350–4.500)

## 2022-05-09 LAB — TROPONIN I (HIGH SENSITIVITY)
Troponin I (High Sensitivity): 20 ng/L — ABNORMAL HIGH (ref <18)
Troponin I (High Sensitivity): 20 ng/L — ABNORMAL HIGH (ref <18)
Troponin I (High Sensitivity): 20 ng/L — ABNORMAL HIGH (ref <18)

## 2022-05-09 LAB — T4, FREE: Free T4: 1.22 ng/dL — ABNORMAL HIGH (ref 0.61–1.12)

## 2022-05-09 LAB — D-DIMER, QUANTITATIVE: D-Dimer, Quant: 1.07 ug/mL-FEU — ABNORMAL HIGH (ref 0.00–0.50)

## 2022-05-09 MED ORDER — MAGNESIUM SULFATE 2 GM/50ML IV SOLN
2.0000 g | Freq: Once | INTRAVENOUS | Status: AC
  Administered 2022-05-10: 2 g via INTRAVENOUS
  Filled 2022-05-09: qty 50

## 2022-05-09 MED ORDER — FUROSEMIDE 10 MG/ML IJ SOLN
20.0000 mg | Freq: Once | INTRAMUSCULAR | Status: AC
  Administered 2022-05-10: 20 mg via INTRAVENOUS
  Filled 2022-05-09: qty 2

## 2022-05-09 MED ORDER — LORAZEPAM 1 MG PO TABS
0.5000 mg | ORAL_TABLET | Freq: Once | ORAL | Status: AC
  Administered 2022-05-09: 0.5 mg via ORAL
  Filled 2022-05-09: qty 1

## 2022-05-09 MED ORDER — APIXABAN 5 MG PO TABS
5.0000 mg | ORAL_TABLET | Freq: Two times a day (BID) | ORAL | Status: DC
  Administered 2022-05-09 – 2022-05-10 (×2): 5 mg via ORAL
  Filled 2022-05-09 (×2): qty 1

## 2022-05-09 MED ORDER — HEPARIN (PORCINE) 25000 UT/250ML-% IV SOLN
950.0000 [IU]/h | INTRAVENOUS | Status: DC
Start: 2022-05-09 — End: 2022-05-09
  Administered 2022-05-09: 950 [IU]/h via INTRAVENOUS
  Filled 2022-05-09: qty 250

## 2022-05-09 NOTE — ED Notes (Signed)
Pt returned from CT °

## 2022-05-09 NOTE — Assessment & Plan Note (Signed)
Observation med/tele bed. Check echo. Tsh is normal. Start eliquis 5 mg bid. Cardiology consult in AM. CHAD-Vasc score is 4.

## 2022-05-09 NOTE — Assessment & Plan Note (Addendum)
Hold acei for now. Awaiting echo. In case pt needs entresto.

## 2022-05-09 NOTE — Subjective & Objective (Signed)
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  Patient's daughter gives the majority of the history.  Her daughter [name] states the patient's Apple Watch has been telling her the patient has been having atrial fibrillation for the last several days.  Patient herself has been feeling somewhat tired.  No chest pain or palpitations.  Patient started on Zoloft 1 month ago due to some situational anxiety.  Daughter states that the patient's neighbor recently had their granddaughter move into their home.  This reported neighbors granddaughter is a convicted felon.  This gave the patient anxiety.  Apparently this person verbally confronted the patient about staring out the window and looking at her.  This gave the patient some fear.  Patient was started on Zoloft.  She states that it did help but caused lots of diarrhea.  Zoloft was weaned off and the patient was started on BuSpar last week.  Since starting BuSpar, the patient has been feeling increasingly tired.  Patient went to the [facility] urgent care clinic today.  EKG showed rate controlled A-fib.  Patient was transferred to the ER for evaluation.  On arrival temp 98 heart rate 74 blood pressure 164/85 satting high percent on room air.  Labs showed a sodium 131, potassium 4.4, BUN 23, creatinine 0.7  White count 4.6, hemoglobin 9.9, platelets of 220  TSH normal at 2.4, magnesium 1.8  CTPA performed, final results not yet reported.  [facility] hospitalist contacted for admission.

## 2022-05-09 NOTE — Assessment & Plan Note (Signed)
BNP pending. Right side pleural effusion. Give 1 dose IV lasix in ER. Start po lasix in the AM.

## 2022-05-09 NOTE — Assessment & Plan Note (Signed)
About 2 gram/dl drop since 03-938. No recent bleeding into ileostomy bag. Check TIBC/iron.

## 2022-05-09 NOTE — ED Notes (Signed)
Pt returned from vascular

## 2022-05-09 NOTE — ED Notes (Signed)
Admitting provider at bedside.

## 2022-05-09 NOTE — Assessment & Plan Note (Signed)
Chronic. Pt with hx of anal cancer in early 2000s s/p resection.

## 2022-05-09 NOTE — ED Provider Notes (Signed)
Patient signed out to me by previous provider. Please refer to their note for full HPI.  Briefly this is an 89 year old female who presented with chest pain and palpitations.  Found to be in new onset atrial fibrillation.  Troponin slightly elevated at 20 but flat.  D-dimer is elevated.  Chest x-ray shows findings of pulmonary vascular congestion, I added on a BNP.  She is already being heparinized but a CT PE study is ordered to be thorough and rule out PE.  Otherwise patient will be admitted in the setting of new onset atrial fibrillation with findings of CHF.  Dose of Lasix has been ordered.  Patients evaluation and results requires admission for further treatment and care.  Spoke with hospitalist, reviewed patient's ED course and they accept admission.  Patient agrees with admission plan, offers no new complaints and is stable/unchanged at time of admit.   Rozelle Logan, DO 05/09/22 2237

## 2022-05-09 NOTE — Progress Notes (Signed)
ANTICOAGULATION CONSULT NOTE - Initial Consult  Pharmacy Consult for Heparin Indication: atrial fibrillation  No Known Allergies  Patient Measurements: Height: 5' (152.4 cm) Weight: 64 kg (141 lb 1.5 oz) IBW/kg (Calculated) : 45.5 Heparin Dosing Weight: 59 kg  Vital Signs: Temp: 98 F (36.7 C) (04/14 1351) BP: 170/97 (04/14 1351) Pulse Rate: 76 (04/14 1351)  Labs: Recent Labs    05/09/22 1225  HGB 9.9*  HCT 30.1*  PLT 220  CREATININE 0.74  TROPONINIHS 20*    Estimated Creatinine Clearance: 41.4 mL/min (by C-G formula based on SCr of 0.74 mg/dL).   Medical History: Past Medical History:  Diagnosis Date   Anemia    Anxiety    GERD (gastroesophageal reflux disease)    Hyperparathyroidism 2019   Hypertension    Ileostomy in place    Osteoporosis 2019   Pneumonia    hx of   rectal ca 2007   surg only   Assessment: 57 yof with a history of anxiety, arthritis, cataract, HTN, UC. Patient is presenting with new onset AF. Heparin per pharmacy consult placed for atrial fibrillation.  Patient is not on anticoagulation prior to arrival.  Hgb 9.9 - low compared to 1 year ago; plt 220  Goal of Therapy:  Heparin level 0.3-0.7 units/ml Monitor platelets by anticoagulation protocol: Yes   Plan:  No initial heparin bolus given hgb <10 Start heparin infusion at 950 units/hr Check anti-Xa level in 8 hours and daily while on heparin Continue to monitor H&H and platelets  Delmar Landau, PharmD, BCPS 05/09/2022 3:35 PM ED Clinical Pharmacist -  [phone]

## 2022-05-09 NOTE — H&P (Signed)
History and Physical    Sandy Trujillo ZOX:[id number] DOB: 1932/10/06 DOA: 05/09/2022  DOS: the patient was seen and examined on 05/09/2022  PCP: Sandy Ates, MD   Patient coming from: Home  I have personally briefly reviewed patient's old medical records in [facility]  CC: afib HPI: 89 year old female history of hypertension, anxiety, history of anal cancer status post colectomy with ileostomy in place since the early 2000's presents to the ER from [facility] urgent care clinic due to new onset atrial fibrillation.  Patient's daughter gives the majority of the history.  Her daughter Sandy Trujillo states the patient's Apple Watch has been telling her the patient has been having atrial fibrillation for the last several days.  Patient herself has been feeling somewhat tired.  No chest pain or palpitations.  Patient started on Zoloft 1 month ago due to some situational anxiety.  Daughter states that the patient's neighbor recently had their granddaughter move into their home.  This reported neighbors granddaughter is a convicted felon.  This gave the patient anxiety.  Apparently this person verbally confronted the patient about staring out the window and looking at her.  This gave the patient some fear.  Patient was started on Zoloft.  She states that it did help but caused lots of diarrhea.  Zoloft was weaned off and the patient was started on BuSpar last week.  Since starting BuSpar, the patient has been feeling increasingly tired.  Patient went to the [facility] urgent care clinic today.  EKG showed rate controlled A-fib.  Patient was transferred to the ER for evaluation.  On arrival temp 98 heart rate 74 blood pressure 164/85 satting high percent on room air.  Labs showed a sodium 131, potassium 4.4, BUN 23, creatinine 0.7  White count 4.6, hemoglobin 9.9, platelets of 220  TSH normal at 2.4, magnesium 1.8  CTPA performed, final results not yet reported.  [facility] hospital[facility] contacted for  admission.   ED Course: EKG shows rate controlled afib  Review of Systems:  Review of Systems  Constitutional:  Positive for malaise/fatigue.  HENT: Negative.    Eyes: Negative.   Respiratory: Negative.    Cardiovascular:  Positive for leg swelling. Negative for palpitations and orthopnea.       Chronic left leg swelling due to prior trauma  Gastrointestinal: Negative.   Genitourinary: Negative.   Musculoskeletal: Negative.        Normally uses cane for balance. Has had to use walker for last 2-3 days due to worsening weakness.  Skin: Negative.   Neurological:  Positive for weakness.  Endo/Heme/Allergies: Negative.   Psychiatric/Behavioral:  The patient is nervous/anxious.   All other systems reviewed and are negative.   Past Medical History:  Diagnosis Date   Anemia    Anxiety    GERD (gastroesophageal reflux disease)    Hyperparathyroidism 2019   Hypertension    Ileostomy in place    Osteoporosis 2019   Pneumonia    hx of   rectal ca 2007   surg only    Past Surgical History:  Procedure Laterality Date   ABDOMINAL HYSTERECTOMY     BREAST EXCISIONAL BIOPSY Bilateral    benign   COLON SURGERY  2007   PARATHYROIDECTOMY N/A 05/26/2021   Procedure: NECK EXPLORATION WITH RIGHT INFERIOR PARATHYROIDECTOMY;  Surgeon: Darnell Level, MD;  Location: [facility];  Service: General;  Laterality: N/A;   surgery for spin al stenosis      bone spur touching nerve per pt  reports that she has never smoked. She has never used smokeless tobacco. She reports that she does not currently use alcohol. She reports that she does not use drugs.  Allergies  Allergen Reactions   Buspar [Buspirone] Other (See Comments)    Increased risk for Afib   Risedronate Other (See Comments)    Lowered BP   Sertraline Diarrhea    History reviewed. No pertinent family history.  Prior to Admission medications   Medication Sig Start Date End Date Taking? Authorizing Provider  Cholecalciferol  (VITAMIN D3 SUPER STRENGTH) 50 MCG (2000 UT) TABS Take 2,000 Units by mouth daily.   Yes [provider]  cyanocobalamin (VITAMIN B12) 500 MCG tablet Take 500 mcg by mouth daily.   Yes [provider]  lisinopril (ZESTRIL) 20 MG tablet Take 20 mg by mouth daily.   Yes [provider]  loperamide (IMODIUM) 2 MG capsule Take 2 mg by mouth as needed for diarrhea or loose stools.   Yes [provider]  loratadine (CLARITIN) 10 MG tablet Take 5 mg by mouth daily.   Yes [provider]  Multiple Vitamins-Minerals (CENTRUM SILVER PO) Take 1 tablet by mouth daily.   Yes [provider]    Physical Exam: Vitals:   05/09/22 1849 05/09/22 1849 05/09/22 2200 05/09/22 2208  BP: (!) 160/71  (!) 127/97   Pulse: 69  70   Resp: 19  17   Temp:  (!) 97.5 F (36.4 C)  (!) 97.5 F (36.4 C)  TempSrc:  Oral  Oral  SpO2: 98%  97%   Weight:      Height:        Physical Exam Vitals and nursing note reviewed.  Constitutional:      General: She is not in acute distress.    Appearance: Normal appearance. She is not ill-appearing, toxic-appearing or diaphoretic.  HENT:     Head: Normocephalic and atraumatic.     Nose: Nose normal.  Cardiovascular:     Rate and Rhythm: Normal rate. Rhythm irregular.     Pulses: Normal pulses.  Pulmonary:     Effort: Pulmonary effort is normal.     Breath sounds: Normal breath sounds.  Abdominal:     General: Abdomen is flat. Bowel sounds are normal. There is no distension.     Palpations: Abdomen is soft.     Tenderness: There is no abdominal tenderness.    Musculoskeletal:     Right lower leg: No edema.     Left lower leg: No edema.  Skin:    General: Skin is warm and dry.     Capillary Refill: Capillary refill takes less than 2 seconds.  Neurological:     General: No focal deficit present.     Mental Status: She is alert and oriented to person, place, and time.      Labs on Admission: I have personally  reviewed following labs and imaging studies  CBC: Recent Labs  Lab 05/09/22 1225  WBC 4.6  HGB 9.9*  HCT 30.1*  MCV 83.1  PLT 220   Basic Metabolic Panel: Recent Labs  Lab 05/09/22 1225 05/09/22 1306  NA 131*  --   K 4.4  --   CL 95*  --   CO2 27  --   GLUCOSE 107*  --   BUN 23  --   CREATININE 0.74  --   CALCIUM 9.6  --   MG  --  1.8   GFR: Estimated Creatinine Clearance:  41.4 mL/min (by C-G formula based on SCr of 0.74 mg/dL). Liver Function Tests: Recent Labs  Lab 05/09/22 1225  AST 43*  ALT 45*  ALKPHOS 88  BILITOT 0.7  PROT 7.2  ALBUMIN 3.5   Cardiac Enzymes: Recent Labs  Lab 05/09/22 1225 05/09/22 1306 05/09/22 1901  TROPONINIHS 20* 20* 20*   Thyroid Function Tests: Recent Labs    05/09/22 1225 05/09/22 1306  TSH 2.428  --   FREET4  --  1.22*    Radiological Exams on Admission: I have personally reviewed images VAS [place] LOWER EXTREMITY VENOUS (DVT) (ONLY [facility] & [facility])  Result Date: 05/09/2022  Lower Venous DVT Study Patient Name:  SIRENITY SHEW  Date of Exam:   05/09/2022 Medical Rec #: [id number]          Accession #:    [id number] Date of Birth: 08/01/1932         Patient Gender: F Patient Age:   13 years Exam Location:  [facility] Procedure:      VAS [place] LOWER EXTREMITY VENOUS (DVT) Referring Phys: Ernie Avena --------------------------------------------------------------------------------  Indications: Edema. Other Indications: New onset atrial fibrillation. Risk Factors: Ileostomy. Limitations: Clothing/ileostomy bag. Comparison Study: No prior study on file Performing Technologist: Sherren Kerns RVS  Examination Guidelines: A complete evaluation includes B-mode imaging, spectral Doppler, color Doppler, and power Doppler as needed of all accessible portions of each vessel. Bilateral testing is considered an integral part of a complete examination. Limited examinations for reoccurring indications may be performed as noted. The reflux portion  of the exam is performed with the patient in reverse Trendelenburg.  +---------+---------------+---------+-----------+---------------+--------------+ RIGHT    CompressibilityPhasicitySpontaneityProperties     Thrombus Aging +---------+---------------+---------+-----------+---------------+--------------+ CFV                                         pulsatile      patent by                                                  waveforms      color and                                                                 Doppler        +---------+---------------+---------+-----------+---------------+--------------+ SFJ      Full                                                             +---------+---------------+---------+-----------+---------------+--------------+ FV Prox  Full                               pulsatile  waveforms                     +---------+---------------+---------+-----------+---------------+--------------+ FV Mid   Full                               pulsatile                                                                 waveforms                     +---------+---------------+---------+-----------+---------------+--------------+ FV DistalFull                                                             +---------+---------------+---------+-----------+---------------+--------------+ PFV                                         pulsatile                                                                 waveforms                     +---------+---------------+---------+-----------+---------------+--------------+ POP      Full                               pulsatile                                                                 waveforms                     +---------+---------------+---------+-----------+---------------+--------------+ PTV      Full                                                              +---------+---------------+---------+-----------+---------------+--------------+ PERO     Full                                                             +---------+---------------+---------+-----------+---------------+--------------+   +---------+---------------+---------+-----------+---------------+--------------+ LEFT  CompressibilityPhasicitySpontaneityProperties     Thrombus Aging +---------+---------------+---------+-----------+---------------+--------------+ CFV      Full                               pulsatile                                                                 waveforms                     +---------+---------------+---------+-----------+---------------+--------------+ SFJ      Full                                                             +---------+---------------+---------+-----------+---------------+--------------+ FV Prox  Full                               pulsatile                                                                 waveforms                     +---------+---------------+---------+-----------+---------------+--------------+ FV Mid   Full                                                             +---------+---------------+---------+-----------+---------------+--------------+ FV DistalFull                               pulsatile                                                                 waveforms                     +---------+---------------+---------+-----------+---------------+--------------+ PFV                                                        Not well  visualized     +---------+---------------+---------+-----------+---------------+--------------+ POP      Full                               pulsatile                                                                  waveforms                     +---------+---------------+---------+-----------+---------------+--------------+ PTV      Full                                                             +---------+---------------+---------+-----------+---------------+--------------+ PERO     Full                                                             +---------+---------------+---------+-----------+---------------+--------------+    Summary: RIGHT: - There is no evidence of deep vein thrombosis in the lower extremity. However, portions of this examination were limited- see technologist comments above.  pulsatile waveforms and interstitial edema consistent with fluid overload  LEFT: - There is no evidence of deep vein thrombosis in the lower extremity. However, portions of this examination were limited- see technologist comments above.  Pulsatile waveforms and interstitial edema consistent with fluid overload.  *See table(s) above for measurements and observations.    Preliminary    DG Chest 2 View  Result Date: 05/09/2022 CLINICAL DATA:  atrial fibrillation. EXAM: CHEST - 2 VIEW COMPARISON:  05/19/2021 FINDINGS: Heart size is mildly enlarged. Small bilateral pleural effusions are new compared with the previous exam. Pulmonary vascular congestion identified. No frank interstitial edema. Mild atelectasis noted within the lung bases. Spondylosis within the thoracic spine. IMPRESSION: 1. New small bilateral pleural effusions. 2. Pulmonary vascular congestion. Electronically Signed   By: Signa Kell M.D.   On: 05/09/2022 13:18    EKG: My personal interpretation of EKG shows: afib     Assessment/Plan Principal Problem:   PAF (paroxysmal atrial fibrillation) Active Problems:   Pleural effusion on right   ANXIETY   Essential hypertension   Ileostomy in place   Normocytic anemia    Assessment and Plan: * PAF (paroxysmal atrial fibrillation) Observation med/tele bed.  Check echo. Tsh is normal. Start eliquis 5 mg bid. Cardiology consult in AM. CHAD-Vasc score is 4.  Pleural effusion on right BNP pending. Right side pleural effusion. Give 1 dose IV lasix in ER. Start po lasix in the AM.  Normocytic anemia About 2 gram/dl drop since 05-6211. No recent bleeding into ileostomy bag. Check TIBC/iron.  Ileostomy in place Chronic. Pt with hx of anal cancer in early 2000s s/p resection.  Essential hypertension Hold acei for now. Awaiting echo. In case pt needs entresto.  ANXIETY Still stop buspar for now. Dtr states  pt's anxiety is situational due to pt's neighbor having their grand-dtr move into their home. Apparently this neighbor's grand-dtr is a convicted felon and this caused pt to be anxious about it.   DVT prophylaxis: Eliquis Code Status: Full Code Family Communication: discussed at bedside with dtr Sandy Trujillo  Disposition Plan: return home  Consults called: cardiology consulted via Epic message Admission status: Observation, Telemetry bed   Carollee Herter, DO [facility] 05/09/2022, 11:48 PM

## 2022-05-09 NOTE — Assessment & Plan Note (Addendum)
Still stop buspar for now. Dtr states pt's anxiety is situational due to pt's neighbor having their grand-dtr move into their home. Apparently this neighbor's grand-dtr is a convicted felon and this caused pt to be anxious about it.

## 2022-05-09 NOTE — ED Provider Notes (Signed)
[place] [facility] AT [facility] Provider Note   CSN: [id number] Arrival date & time: 05/09/22  1131     History  Chief Complaint  Patient presents with   Atrial Fibrillation   Weakness    NYNA Sandy Trujillo is a 89 y.o. female.   Atrial Fibrillation  Weakness    89 year old female with medical history significant for anxiety, anemia, GERD, HTN, ileostomy in place, osteoporosis, rectal cancer who presents to the emergency department with new onset atrial fibrillation.  The patient states that she been feeling anxious for the past couple of days and felt like her heart was experiencing palpitations.  Her Apple Watch noted her to be in atrial fibrillation for the past 2 to 3 days.  She presented to her outpatient PCP and was found to be in atrial fibrillation, rate controlled.  She was sent to the emergency primary for further evaluation.  She denies any chest pain, denies any shortness of breath.  Endorses anxiousness and a feeling of heart palpitations.  She recently been placed on Zoloft and hydroxyzine and then has been switched to buspirone.  Denies any cough, fever or chills.  Home Medications Prior to Admission medications   Medication Sig Start Date End Date Taking? Authorizing Provider  Cholecalciferol (VITAMIN D3 SUPER STRENGTH) 50 MCG (2000 UT) TABS Take 2,000 Units by mouth daily.   Yes [provider]  cyanocobalamin (VITAMIN B12) 500 MCG tablet Take 500 mcg by mouth daily.   Yes [provider]  lisinopril (ZESTRIL) 20 MG tablet Take 20 mg by mouth daily.   Yes [provider]  loperamide (IMODIUM) 2 MG capsule Take 2 mg by mouth as needed for diarrhea or loose stools.   Yes [provider]  loratadine (CLARITIN) 10 MG tablet Take 5 mg by mouth daily.   Yes [provider]  Multiple Vitamins-Minerals (CENTRUM SILVER PO) Take 1 tablet by mouth daily.   Yes [provider]      Allergies     Buspar [buspirone], Risedronate, and Sertraline    Review of Systems   Review of Systems  Cardiovascular:  Positive for palpitations.  Neurological:  Positive for weakness.  All other systems reviewed and are negative.   Physical Exam Updated Vital Signs BP (!) 127/97   Pulse 70   Temp (!) 97.5 F (36.4 C) (Oral)   Resp 17   Ht 5' (1.524 m)   Wt 64 kg   SpO2 97%   BMI 27.56 kg/m  Physical Exam Vitals and nursing note reviewed.  Constitutional:      General: She is not in acute distress.    Appearance: She is well-developed.  HENT:     Head: Normocephalic and atraumatic.  Eyes:     Conjunctiva/sclera: Conjunctivae normal.  Cardiovascular:     Rate and Rhythm: Normal rate and regular rhythm.  Pulmonary:     Effort: Pulmonary effort is normal. No respiratory distress.     Breath sounds: Normal breath sounds.  Abdominal:     Palpations: Abdomen is soft.     Tenderness: There is no abdominal tenderness.  Musculoskeletal:        General: No swelling.     Cervical back: Neck supple.     Right lower leg: No edema.     Left lower leg: No edema.  Skin:    General: Skin is warm and dry.     Capillary Refill: Capillary refill takes less than 2 seconds.  Neurological:  Mental Status: She is alert.  Psychiatric:        Mood and Affect: Mood normal.     ED Results / Procedures / Treatments   Labs (all labs ordered are listed, but only abnormal results are displayed) Labs Reviewed  CBC - Abnormal; Notable for the following components:      Result Value   RBC 3.62 (*)    Hemoglobin 9.9 (*)    HCT 30.1 (*)    All other components within normal limits  COMPREHENSIVE METABOLIC PANEL - Abnormal; Notable for the following components:   Sodium 131 (*)    Chloride 95 (*)    Glucose, Bld 107 (*)    AST 43 (*)    ALT 45 (*)    All other components within normal limits  D-DIMER, QUANTITATIVE - Abnormal; Notable for the following components:   D-Dimer, Quant 1.07 (*)     All other components within normal limits  T4, FREE - Abnormal; Notable for the following components:   Free T4 1.22 (*)    All other components within normal limits  TROPONIN I (HIGH SENSITIVITY) - Abnormal; Notable for the following components:   Troponin I (High Sensitivity) 20 (*)    All other components within normal limits  TROPONIN I (HIGH SENSITIVITY) - Abnormal; Notable for the following components:   Troponin I (High Sensitivity) 20 (*)    All other components within normal limits  TROPONIN I (HIGH SENSITIVITY) - Abnormal; Notable for the following components:   Troponin I (High Sensitivity) 20 (*)    All other components within normal limits  TSH  MAGNESIUM  OCCULT BLOOD X 1 CARD TO LAB, STOOL  HEPARIN LEVEL (UNFRACTIONATED)  HEPARIN LEVEL (UNFRACTIONATED)  BRAIN NATRIURETIC PEPTIDE    EKG EKG Interpretation  Date/Time:  Sunday May 09 2022 19:01:04 EDT Ventricular Rate:  77 PR Interval:    QRS Duration: 98 QT Interval:  420 QTC Calculation: 482 R Axis:   105 Text Interpretation: Atrial fibrillation Probable lateral infarct, age indeterminate ST elevation, consider inferior injury Artifact in lead(s) I II III aVR aVL aVF V2 V3 Confirmed by Ernie Avena ([id number]) on 05/09/2022 8:21:58 PM  Radiology VAS [place] LOWER EXTREMITY VENOUS (DVT) (ONLY [facility] & [facility])  Result Date: 05/09/2022  Lower Venous DVT Study Patient Name:  DOMINIQUA HILLS  Date of Exam:   05/09/2022 Medical Rec #: [id number]          Accession #:    [id number] Date of Birth: 08-31-1932         Patient Gender: F Patient Age:   38 years Exam Location:  [facility] Procedure:      VAS [place] LOWER EXTREMITY VENOUS (DVT) Referring Phys: Ernie Avena --------------------------------------------------------------------------------  Indications: Edema. Other Indications: New onset atrial fibrillation. Risk Factors: Ileostomy. Limitations: Clothing/ileostomy bag. Comparison Study: No prior study on file Performing  Technologist: Sherren Kerns RVS  Examination Guidelines: A complete evaluation includes B-mode imaging, spectral Doppler, color Doppler, and power Doppler as needed of all accessible portions of each vessel. Bilateral testing is considered an integral part of a complete examination. Limited examinations for reoccurring indications may be performed as noted. The reflux portion of the exam is performed with the patient in reverse Trendelenburg.  +---------+---------------+---------+-----------+---------------+--------------+ RIGHT    CompressibilityPhasicitySpontaneityProperties     Thrombus Aging +---------+---------------+---------+-----------+---------------+--------------+ CFV  pulsatile      patent by                                                  waveforms      color and                                                                 Doppler        +---------+---------------+---------+-----------+---------------+--------------+ SFJ      Full                                                             +---------+---------------+---------+-----------+---------------+--------------+ FV Prox  Full                               pulsatile                                                                 waveforms                     +---------+---------------+---------+-----------+---------------+--------------+ FV Mid   Full                               pulsatile                                                                 waveforms                     +---------+---------------+---------+-----------+---------------+--------------+ FV DistalFull                                                             +---------+---------------+---------+-----------+---------------+--------------+ PFV                                         pulsatile  waveforms                     +---------+---------------+---------+-----------+---------------+--------------+ POP      Full                               pulsatile                                                                 waveforms                     +---------+---------------+---------+-----------+---------------+--------------+ PTV      Full                                                             +---------+---------------+---------+-----------+---------------+--------------+ PERO     Full                                                             +---------+---------------+---------+-----------+---------------+--------------+   +---------+---------------+---------+-----------+---------------+--------------+ LEFT     CompressibilityPhasicitySpontaneityProperties     Thrombus Aging +---------+---------------+---------+-----------+---------------+--------------+ CFV      Full                               pulsatile                                                                 waveforms                     +---------+---------------+---------+-----------+---------------+--------------+ SFJ      Full                                                             +---------+---------------+---------+-----------+---------------+--------------+ FV Prox  Full                               pulsatile                                                                 waveforms                     +---------+---------------+---------+-----------+---------------+--------------+  FV Mid   Full                                                             +---------+---------------+---------+-----------+---------------+--------------+ FV DistalFull                               pulsatile                                                                 waveforms                      +---------+---------------+---------+-----------+---------------+--------------+ PFV                                                        Not well                                                                  visualized     +---------+---------------+---------+-----------+---------------+--------------+ POP      Full                               pulsatile                                                                 waveforms                     +---------+---------------+---------+-----------+---------------+--------------+ PTV      Full                                                             +---------+---------------+---------+-----------+---------------+--------------+ PERO     Full                                                             +---------+---------------+---------+-----------+---------------+--------------+    Summary: RIGHT: - There is no evidence of deep vein thrombosis in the lower extremity. However, portions of this examination were limited- see  technologist comments above.  pulsatile waveforms and interstitial edema consistent with fluid overload  LEFT: - There is no evidence of deep vein thrombosis in the lower extremity. However, portions of this examination were limited- see technologist comments above.  Pulsatile waveforms and interstitial edema consistent with fluid overload.  *See table(s) above for measurements and observations.    Preliminary    DG Chest 2 View  Result Date: 05/09/2022 CLINICAL DATA:  atrial fibrillation. EXAM: CHEST - 2 VIEW COMPARISON:  05/19/2021 FINDINGS: Heart size is mildly enlarged. Small bilateral pleural effusions are new compared with the previous exam. Pulmonary vascular congestion identified. No frank interstitial edema. Mild atelectasis noted within the lung bases. Spondylosis within the thoracic spine. IMPRESSION: 1. New small bilateral pleural effusions. 2. Pulmonary vascular congestion.  Electronically Signed   By: Signa Kell M.D.   On: 05/09/2022 13:18    Procedures Procedures    Medications Ordered in ED Medications  heparin ADULT infusion 100 units/mL (25000 units/252mL) (950 Units/hr Intravenous New Bag/Given 05/09/22 1654)  furosemide (LASIX) injection 20 mg (has no administration in time range)  LORazepam (ATIVAN) tablet 0.5 mg (0.5 mg Oral Given 05/09/22 1454)    ED Course/ Medical Decision Making/ A&P         CHA2DS2-VASc Score: 4                    Medical Decision Making Amount and/or Complexity of Data Reviewed Labs: ordered. Radiology: ordered.  Risk Prescription drug management. Decision regarding hospitalization.      89 year old female with medical history significant for anxiety, anemia, GERD, HTN, ileostomy in place, osteoporosis, rectal cancer who presents to the emergency department with new onset atrial fibrillation.  The patient states that she been feeling anxious for the past couple of days and felt like her heart was experiencing palpitations.  Her Apple Watch noted her to be in atrial fibrillation for the past 2 to 3 days.  She presented to her outpatient PCP and was found to be in atrial fibrillation, rate controlled.  She was sent to the emergency primary for further evaluation.  She denies any chest pain, denies any shortness of breath.  Endorses anxiousness and a feeling of heart palpitations.  She recently been placed on Zoloft and hydroxyzine and then has been switched to buspirone.  Denies any cough, fever or chills.  On arrival, the patient was afebrile, heart rate 74, RR 18, BP 164/85, saturating 100% on room air.  Irregularly irregular rhythm noted on telemetry and on physical exam, atrial fibrillation noted on cardiac telemetry and confirmed on EKG.  She presents with rate controlled new onset atrial fibrillation.  Her CHA2DS2-VASc score is a 4.  She was started on empiric heparin as she has been in likely atrial fibrillation for  the past few days.   EKG revealed atrial fibrillation, no acute ischemic changes, possible age-indeterminate ST segment changes inferior laterally with artifact present.  Cardiac troponins were found to be mildly elevated but flat x 3 at 28.  A D-dimer was elevated at 1.07, CBC revealed no leukocytosis, mild anemia to 9.9, TSH was normal at 2.4, CMP without significant electrolyte abnormality, normal renal function, mildly elevated LFTs with an AST of 43 and an ALT of 45.  DVT ultrasound was performed bilaterally in the lower extremities with no evidence of DVT.  A chest x-ray was performed which revealed: IMPRESSION:  1. New small bilateral pleural effusions.  2. Pulmonary vascular congestion.    The patient  developed chest discomfort while in the emergency department, rated 2 out of 10.  Repeat EKG did not reveal evidence of STEMI and the patient has had multiple cardiac troponins that are flat and not rising.  Low concern for ACS at this time.  CT angiogram PE study was ordered given the patient's new onset atrial fibrillation.  Plan to follow-up CT angiogram, admit the patient for further monitoring the setting of her chest pain and new onset atrial fibrillation.  Signout given to Dr. Wilkie Aye at 2200.   Final Clinical Impression(s) / ED Diagnoses Final diagnoses:  Atrial fibrillation, unspecified type  Chest pain, unspecified type    Rx / DC Orders ED Discharge Orders     None         Ernie Avena, MD 05/09/22 2252

## 2022-05-09 NOTE — Progress Notes (Signed)
VASCULAR LAB    Bilateral lower extremity venous duplex has been performed.  See CV proc for preliminary results.   Windie Marasco, RVT 05/09/2022, 6:30 PM

## 2022-05-09 NOTE — ED Triage Notes (Signed)
PT brought here by daughter for new onset afib.  Pt is normally self-sufficient and ambulatory.  This week she has needed a walker and a wheel chair.  Recently placed on zoloft and hydroxzzine and then buspirone.  However, last night, pt watch alarmed afib.  [facility] sent pt here for new onset afib.

## 2022-05-09 NOTE — ED Notes (Signed)
Notified MD of pt's reported new chest pain, left-sided, rated 1-2/10 intermittent.

## 2022-05-09 NOTE — ED Notes (Signed)
Patient left in stable condition to vascular. Family and belongings moved to room 38.  Report given.

## 2022-05-10 ENCOUNTER — Other Ambulatory Visit: Payer: Self-pay | Admitting: Cardiology

## 2022-05-10 ENCOUNTER — Telehealth (HOSPITAL_COMMUNITY): Payer: Self-pay

## 2022-05-10 ENCOUNTER — Other Ambulatory Visit (HOSPITAL_COMMUNITY): Payer: Self-pay

## 2022-05-10 ENCOUNTER — Telehealth (HOSPITAL_BASED_OUTPATIENT_CLINIC_OR_DEPARTMENT_OTHER): Payer: Self-pay

## 2022-05-10 ENCOUNTER — Observation Stay (HOSPITAL_BASED_OUTPATIENT_CLINIC_OR_DEPARTMENT_OTHER): Payer: Medicare Other

## 2022-05-10 DIAGNOSIS — I10 Essential (primary) hypertension: Secondary | ICD-10-CM | POA: Diagnosis not present

## 2022-05-10 DIAGNOSIS — I480 Paroxysmal atrial fibrillation: Secondary | ICD-10-CM

## 2022-05-10 DIAGNOSIS — R531 Weakness: Secondary | ICD-10-CM | POA: Diagnosis not present

## 2022-05-10 DIAGNOSIS — R7989 Other specified abnormal findings of blood chemistry: Secondary | ICD-10-CM | POA: Diagnosis not present

## 2022-05-10 DIAGNOSIS — I4891 Unspecified atrial fibrillation: Secondary | ICD-10-CM

## 2022-05-10 DIAGNOSIS — D649 Anemia, unspecified: Secondary | ICD-10-CM | POA: Diagnosis not present

## 2022-05-10 DIAGNOSIS — R5383 Other fatigue: Secondary | ICD-10-CM | POA: Diagnosis not present

## 2022-05-10 LAB — COMPREHENSIVE METABOLIC PANEL
ALT: 42 U/L (ref 0–44)
AST: 42 U/L — ABNORMAL HIGH (ref 15–41)
Albumin: 3.2 g/dL — ABNORMAL LOW (ref 3.5–5.0)
Alkaline Phosphatase: 80 U/L (ref 38–126)
Anion gap: 9 (ref 5–15)
BUN: 20 mg/dL (ref 8–23)
CO2: 27 mmol/L (ref 22–32)
Calcium: 9.4 mg/dL (ref 8.9–10.3)
Chloride: 96 mmol/L — ABNORMAL LOW (ref 98–111)
Creatinine, Ser: 0.79 mg/dL (ref 0.44–1.00)
GFR, Estimated: >60 mL/min (ref >60)
Glucose, Bld: 69 mg/dL — ABNORMAL LOW (ref 70–99)
Potassium: 3.7 mmol/L (ref 3.5–5.1)
Sodium: 132 mmol/L — ABNORMAL LOW (ref 135–145)
Total Bilirubin: 0.6 mg/dL (ref 0.3–1.2)
Total Protein: 6.5 g/dL (ref 6.5–8.1)

## 2022-05-10 LAB — CBC WITH DIFFERENTIAL/PLATELET
Abs Immature Granulocytes: 0.01 10*3/uL (ref 0.00–0.07)
Basophils Absolute: 0 10*3/uL (ref 0.0–0.1)
Basophils Relative: 1 %
Eosinophils Absolute: 0.2 10*3/uL (ref 0.0–0.5)
Eosinophils Relative: 6 %
HCT: 29.6 % — ABNORMAL LOW (ref 36.0–46.0)
Hemoglobin: 9.6 g/dL — ABNORMAL LOW (ref 12.0–15.0)
Immature Granulocytes: 0 %
Lymphocytes Relative: 24 %
Lymphs Abs: 1 10*3/uL (ref 0.7–4.0)
MCH: 26.9 pg (ref 26.0–34.0)
MCHC: 32.4 g/dL (ref 30.0–36.0)
MCV: 82.9 fL (ref 80.0–100.0)
Monocytes Absolute: 0.4 10*3/uL (ref 0.1–1.0)
Monocytes Relative: 10 %
Neutro Abs: 2.6 10*3/uL (ref 1.7–7.7)
Neutrophils Relative %: 59 %
Platelets: 203 10*3/uL (ref 150–400)
RBC: 3.57 MIL/uL — ABNORMAL LOW (ref 3.87–5.11)
RDW: 15.4 % (ref 11.5–15.5)
WBC: 4.4 10*3/uL (ref 4.0–10.5)
nRBC: 0 % (ref 0.0–0.2)

## 2022-05-10 LAB — ECHOCARDIOGRAM COMPLETE
Height: 60 in
S' Lateral: 2.7 cm
Weight: 2257.51 oz

## 2022-05-10 LAB — MAGNESIUM: Magnesium: 2.2 mg/dL (ref 1.7–2.4)

## 2022-05-10 MED ORDER — LOPERAMIDE HCL 2 MG PO CAPS: 2.0000 mg | ORAL_CAPSULE | ORAL | Status: DC | PRN

## 2022-05-10 MED ORDER — ACETAMINOPHEN 325 MG PO TABS: 650.0000 mg | ORAL_TABLET | ORAL | Status: DC | PRN

## 2022-05-10 MED ORDER — IOHEXOL 350 MG/ML SOLN
55.0000 mL | Freq: Once | INTRAVENOUS | Status: AC | PRN
  Administered 2022-05-10: 55 mL via INTRAVENOUS

## 2022-05-10 MED ORDER — APIXABAN 5 MG PO TABS
5.0000 mg | ORAL_TABLET | Freq: Two times a day (BID) | ORAL | 0 refills | Status: DC
  Filled 2022-05-10: qty 60, 30d supply, fill #0

## 2022-05-10 MED ORDER — FUROSEMIDE 20 MG PO TABS
20.0000 mg | ORAL_TABLET | Freq: Every day | ORAL | Status: DC
  Administered 2022-05-10: 20 mg via ORAL
  Filled 2022-05-10: qty 1

## 2022-05-10 MED ORDER — ONDANSETRON HCL 4 MG/2ML IJ SOLN: 4.0000 mg | Freq: Four times a day (QID) | INTRAMUSCULAR | Status: DC | PRN

## 2022-05-10 NOTE — Consult Note (Signed)
Cardiology Consultation   Patient ID: Sandy Trujillo MRN: [id number]; DOB: May 23, 1932  Admit date: 05/09/2022 Date of Consult: 05/10/2022  PCP:  Thana Ates, MD   [facility] Providers Cardiologist:  None        Patient Profile:   Sandy Trujillo is a 89 y.o. female with a hx of rectal cancer s/p colectomy with ileostomy in place, anxiety, GERD, hyperparathyroidism, hypertension who is being seen 05/10/2022 for the evaluation of new afib at the request of Dr. Imogene Burn.  History of Present Illness:   Sandy Trujillo presented to [facility] health urgent care yesterday after her Apple Watch reported new afib over several consecutive days in the setting of increased stress/anxiety and chronic fatigue since December. Given new onset A-fib, patient was sent from urgent care to the emergency department for further evaluation and management.  Patient denies palpitations, chest pain, shortness of breath. She does report chronic fatigue since December, not worse since her Watch began alerting about afib. She does report some nighttime snoring. Other than increased stress, patient feels normal/says that she would not have known about afib if watch had not alerted.  Of note appears the patient has recently been placed on Zoloft and hydroxyzine, subsequently switched to buspirone from Zoloft due to GI upset.  In the emergency department, patient found to be afebrile, hypertensive, with rate controlled A-fib.  Troponin was checked and found mildly elevated but flat at 20.  Chest x-ray noted small bilateral pleural effusions and possible pulmonary vascular congestion.  While in the ED, patient did develop some chest discomfort.  This prompted the ED to check D-dimer.  This resulted at 1.07, CTA chest negative for PE.  There is also noted small to moderate right and small left pleural effusions.  Complete blood count was without leukocytosis.  Patient mildly anemic with hemoglobin of 9.9.  TSH normal  at 2.4.  CMP did not reveal notable electrolyte derangements.  She admitted to the medicine team where she was started on Eliquis 5 mg twice daily and given Lasix due to pleural effusion.   Past Medical History:  Diagnosis Date   Anemia    Anxiety    GERD (gastroesophageal reflux disease)    Hyperparathyroidism 2019   Hypertension    Ileostomy in place    Osteoporosis 2019   Pneumonia    hx of   rectal ca 2007   surg only    Past Surgical History:  Procedure Laterality Date   ABDOMINAL HYSTERECTOMY     BREAST EXCISIONAL BIOPSY Bilateral    benign   COLON SURGERY  2007   PARATHYROIDECTOMY N/A 05/26/2021   Procedure: NECK EXPLORATION WITH RIGHT INFERIOR PARATHYROIDECTOMY;  Surgeon: Darnell Level, MD;  Location: [facility];  Service: General;  Laterality: N/A;   surgery for spin al stenosis      bone spur touching nerve per pt     Home Medications:  Prior to Admission medications   Medication Sig Start Date End Date Taking? Authorizing Provider  Cholecalciferol (VITAMIN D3 SUPER STRENGTH) 50 MCG (2000 UT) TABS Take 2,000 Units by mouth daily.   Yes [provider]  cyanocobalamin (VITAMIN B12) 500 MCG tablet Take 500 mcg by mouth daily.   Yes [provider]  lisinopril (ZESTRIL) 20 MG tablet Take 20 mg by mouth daily.   Yes [provider]  loperamide (IMODIUM) 2 MG capsule Take 2 mg by mouth as needed for diarrhea or loose stools.   Yes [provider]  loratadine (CLARITIN) 10 MG tablet Take 5 mg by mouth daily.   Yes [provider]  Multiple Vitamins-Minerals (CENTRUM SILVER PO) Take 1 tablet by mouth daily.   Yes [provider]    Inpatient Medications: Scheduled Meds:  apixaban  5 mg Oral BID   furosemide  20 mg Oral Daily   Continuous Infusions:  PRN Meds: acetaminophen, loperamide, ondansetron (ZOFRAN) IV  Allergies:    Allergies  Allergen Reactions   Buspar [Buspirone] Other (See Comments)    Increased risk  for Afib   Risedronate Other (See Comments)    Lowered BP   Sertraline Diarrhea    Social History:   Social History   Socioeconomic History   Marital status: Widowed    Spouse name: Not on file   Number of children: Not on file   Years of education: Not on file   Highest education level: Not on file  Occupational History   Not on file  Tobacco Use   Smoking status: Never   Smokeless tobacco: Never  Vaping Use   Vaping Use: Never used  Substance and Sexual Activity   Alcohol use: Not Currently   Drug use: Never   Sexual activity: Not on file  Other Topics Concern   Not on file  Social History Narrative   Not on file   Social Determinants of Health   Financial Resource Strain: Not on file  Food Insecurity: No Food Insecurity (05/09/2022)   Hunger Vital Sign    Worried About Running Out of Food in the Last Year: Never true    Ran Out of Food in the Last Year: Never true  Transportation Needs: No Transportation Needs (05/09/2022)   PRAPARE - Administrator, Civil Service (Medical): No    Lack of Transportation (Non-Medical): No  Physical Activity: Not on file  Stress: Not on file  Social Connections: Not on file  Intimate Partner Violence: Not At Risk (05/09/2022)   Humiliation, Afraid, Rape, and Kick questionnaire    Fear of Current or Ex-Partner: No    Emotionally Abused: No    Physically Abused: No    Sexually Abused: No    Family History:   History reviewed. No pertinent family history.   ROS:  Please see the history of present illness.   All other ROS reviewed and negative.     Physical Exam/Data:   Vitals:   05/10/22 0900 05/10/22 0933 05/10/22 0934 05/10/22 1126  BP: (!) 117/55 134/75    Pulse: 64  70   Resp: 20  19   Temp:    97.6 F (36.4 C)  TempSrc:      SpO2: 96%  98%   Weight:      Height:        Intake/Output Summary (Last 24 hours) at 05/10/2022 1155 Last data filed at 05/10/2022 1610 Gross per 24 hour  Intake 50 ml   Output 200 ml  Net -150 ml      05/09/2022   12:14 PM 05/26/2021    5:33 AM 05/19/2021    2:28 PM  Last 3 Weights  Weight (lbs) 141 lb 1.5 oz 141 lb 1.5 oz 141 lb  Weight (kg) 64 kg 64 kg 63.957 kg     Body mass index is 27.56 kg/m.  General:  Well nourished, well developed, in no acute distress HEENT: normal Neck: no JVD Vascular: No carotid bruits; Distal pulses 2+ bilaterally Cardiac:  normal S1, S2; irregularly irregular Lungs:  bilateral bases are very slightly diminished Abd: soft, nontender, no hepatomegaly  Ext: no edema Musculoskeletal:  No deformities, BUE and BLE strength normal and equal Skin: warm and dry  Neuro:  CNs 2-12 intact, no focal abnormalities noted Psych:  Normal affect   EKG:  The EKG was personally reviewed and demonstrates:  atrial fibrillation, rate controlled. No acute ischemic changes Telemetry:  Telemetry was personally reviewed and demonstrates:  rate controlled afib with overnight bradycardia  Relevant CV Studies:  05/10/22  IMPRESSIONS     1. Left ventricular ejection fraction, by estimation, is 50 to 55%. The  left ventricle has low normal function. The left ventricle has no regional  wall motion abnormalities. Left ventricular diastolic parameters are  indeterminate.   2. Right ventricular systolic function is mildly reduced. The right  ventricular size is mildly enlarged. There is mildly elevated pulmonary  artery systolic pressure.   3. Left atrial size was severely dilated.   4. Right atrial size was moderately dilated.   5. The mitral valve is normal in structure. Moderate mitral valve  regurgitation. No evidence of mitral stenosis.   6. Tricuspid valve regurgitation is moderate.   7. The aortic valve is tricuspid. There is mild calcification of the  aortic valve. There is mild thickening of the aortic valve. Aortic valve  regurgitation is not visualized. No aortic stenosis is present.   8. The inferior vena cava is normal in  size with greater than 50%  respiratory variability, suggesting right atrial pressure of 3 mmHg.   FINDINGS   Left Ventricle: Left ventricular ejection fraction, by estimation, is 50  to 55%. The left ventricle has low normal function. The left ventricle has  no regional wall motion abnormalities. The left ventricular internal  cavity size was normal in size.  There is no left ventricular hypertrophy. Left ventricular diastolic  function could not be evaluated due to atrial fibrillation. Left  ventricular diastolic parameters are indeterminate.   Right Ventricle: The right ventricular size is mildly enlarged. No  increase in right ventricular wall thickness. Right ventricular systolic  function is mildly reduced. There is mildly elevated pulmonary artery  systolic pressure. The tricuspid regurgitant   velocity is 3.03 m/s, and with an assumed right atrial pressure of 3  mmHg, the estimated right ventricular systolic pressure is 39.7 mmHg.   Left Atrium: Left atrial size was severely dilated.   Right Atrium: Right atrial size was moderately dilated.   Pericardium: There is no evidence of pericardial effusion.   Mitral Valve: The mitral valve is normal in structure. Moderate mitral  valve regurgitation. No evidence of mitral valve stenosis.   Tricuspid Valve: The tricuspid valve is normal in structure. Tricuspid  valve regurgitation is moderate . No evidence of tricuspid stenosis.   Aortic Valve: The aortic valve is tricuspid. There is mild calcification  of the aortic valve. There is mild thickening of the aortic valve. Aortic  valve regurgitation is not visualized. No aortic stenosis is present.   Pulmonic Valve: The pulmonic valve was normal in structure. Pulmonic valve  regurgitation is not visualized. No evidence of pulmonic stenosis.   Aorta: The aortic root is normal in size and structure.   Venous: The inferior vena cava is normal in size with greater than 50%   respiratory variability, suggesting right atrial pressure of 3 mmHg.   IAS/Shunts: No atrial level shunt detected by color flow Doppler.    Laboratory Data:  High Sensitivity Troponin:   Recent Labs  Lab 05/09/22 1225 05/09/22 1306 05/09/22 1901  TROPONINIHS 20* 20* 20*     Chemistry Recent Labs  Lab 05/09/22 1225 05/09/22 1306 05/10/22 0430  NA 131*  --  132*  K 4.4  --  3.7  CL 95*  --  96*  CO2 27  --  27  GLUCOSE 107*  --  69*  BUN 23  --  20  CREATININE 0.74  --  0.79  CALCIUM 9.6  --  9.4  MG  --  1.8 2.2  GFRNONAA >60  --  >60  ANIONGAP 9  --  9    Recent Labs  Lab 05/09/22 1225 05/10/22 0430  PROT 7.2 6.5  ALBUMIN 3.5 3.2*  AST 43* 42*  ALT 45* 42  ALKPHOS 88 80  BILITOT 0.7 0.6   Lipids No results for input(s): "CHOL", "TRIG", "HDL", "LABVLDL", "LDLCALC", "CHOLHDL" in the last 168 hours.  Hematology Recent Labs  Lab 05/09/22 1225 05/10/22 0430  WBC 4.6 4.4  RBC 3.62* 3.57*  HGB 9.9* 9.6*  HCT 30.1* 29.6*  MCV 83.1 82.9  MCH 27.3 26.9  MCHC 32.9 32.4  RDW 15.3 15.4  PLT 220 203   Thyroid  Recent Labs  Lab 05/09/22 1225 05/09/22 1306  TSH 2.428  --   FREET4  --  1.22*    BNPNo results for input(s): "BNP", "PROBNP" in the last 168 hours.  DDimer  Recent Labs  Lab 05/09/22 1306  DDIMER 1.07*     Radiology/Studies:  VAS [place] LOWER EXTREMITY VENOUS (DVT) (ONLY [facility] & [facility])  Result Date: 05/10/2022  Lower Venous DVT Study Patient Name:  SARAYU BOYTE  Date of Exam:   05/09/2022 Medical Rec #: [id number]          Accession #:    [id number] Date of Birth: 01/07/1933         Patient Gender: F Patient Age:   55 years Exam Location:  [facility] Procedure:      VAS [place] LOWER EXTREMITY VENOUS (DVT) Referring Phys: Ernie Avena --------------------------------------------------------------------------------  Indications: Edema. Other Indications: New onset atrial fibrillation. Risk Factors: Ileostomy. Limitations: Clothing/ileostomy  bag. Comparison Study: No prior study on file Performing Technologist: Sherren Kerns RVS  Examination Guidelines: A complete evaluation includes B-mode imaging, spectral Doppler, color Doppler, and power Doppler as needed of all accessible portions of each vessel. Bilateral testing is considered an integral part of a complete examination. Limited examinations for reoccurring indications may be performed as noted. The reflux portion of the exam is performed with the patient in reverse Trendelenburg.  +---------+---------------+---------+-----------+---------------+--------------+ RIGHT    CompressibilityPhasicitySpontaneityProperties     Thrombus Aging +---------+---------------+---------+-----------+---------------+--------------+ CFV                                         pulsatile      patent by                                                  waveforms      color and  Doppler        +---------+---------------+---------+-----------+---------------+--------------+ SFJ      Full                                                             +---------+---------------+---------+-----------+---------------+--------------+ FV Prox  Full                               pulsatile                                                                 waveforms                     +---------+---------------+---------+-----------+---------------+--------------+ FV Mid   Full                               pulsatile                                                                 waveforms                     +---------+---------------+---------+-----------+---------------+--------------+ FV DistalFull                                                             +---------+---------------+---------+-----------+---------------+--------------+ PFV                                         pulsatile                                                                  waveforms                     +---------+---------------+---------+-----------+---------------+--------------+ POP      Full                               pulsatile  waveforms                     +---------+---------------+---------+-----------+---------------+--------------+ PTV      Full                                                             +---------+---------------+---------+-----------+---------------+--------------+ PERO     Full                                                             +---------+---------------+---------+-----------+---------------+--------------+   +---------+---------------+---------+-----------+---------------+--------------+ LEFT     CompressibilityPhasicitySpontaneityProperties     Thrombus Aging +---------+---------------+---------+-----------+---------------+--------------+ CFV      Full                               pulsatile                                                                 waveforms                     +---------+---------------+---------+-----------+---------------+--------------+ SFJ      Full                                                             +---------+---------------+---------+-----------+---------------+--------------+ FV Prox  Full                               pulsatile                                                                 waveforms                     +---------+---------------+---------+-----------+---------------+--------------+ FV Mid   Full                                                             +---------+---------------+---------+-----------+---------------+--------------+ FV DistalFull                               pulsatile  waveforms                      +---------+---------------+---------+-----------+---------------+--------------+ PFV                                                        Not well                                                                  visualized     +---------+---------------+---------+-----------+---------------+--------------+ POP      Full                               pulsatile                                                                 waveforms                     +---------+---------------+---------+-----------+---------------+--------------+ PTV      Full                                                             +---------+---------------+---------+-----------+---------------+--------------+ PERO     Full                                                             +---------+---------------+---------+-----------+---------------+--------------+     Summary: RIGHT: - There is no evidence of deep vein thrombosis in the lower extremity. However, portions of this examination were limited- see technologist comments above.  pulsatile waveforms and interstitial edema consistent with fluid overload  LEFT: - There is no evidence of deep vein thrombosis in the lower extremity. However, portions of this examination were limited- see technologist comments above.  Pulsatile waveforms and interstitial edema consistent with fluid overload.  *See table(s) above for measurements and observations. Electronically signed by Coral Else MD on 05/10/2022 at 8:06:15 AM.    Final    CT Angio Chest PE W and/or Wo Contrast  Result Date: 05/10/2022 CLINICAL DATA:  Weakness.  Atrial fibrillation.  Positive D-dimer. EXAM: CT ANGIOGRAPHY CHEST WITH CONTRAST TECHNIQUE: Multidetector CT imaging of the chest was performed using the standard protocol during bolus administration of intravenous contrast. Multiplanar CT image reconstructions and MIPs were obtained to evaluate the vascular anatomy.  RADIATION DOSE REDUCTION: This exam was performed according to the departmental dose-optimization program which includes automated exposure control, adjustment of the mA and/or kV according to  patient size and/or use of iterative reconstruction technique. CONTRAST:  55mL OMNIPAQUE IOHEXOL 350 MG/ML SOLN COMPARISON:  Chest radiographs 05/09/2022 and CT chest 03/26/2010 report; images from CT chest 03/29/2008. FINDINGS: Cardiovascular: Satisfactory opacification of the pulmonary arteries to the segmental level. No acute pulmonary embolism. Cardiomegaly. No pericardial effusion. Coronary artery and aortic atherosclerotic calcification. Mediastinum/Nodes: Patulous esophagus containing fluid and debris extending into the upper esophagus. No thoracic adenopathy. Lungs/Pleura: Patchy bilateral centrilobular ground-glass micro nodules. Mild bronchial wall thickening and mucous plugging greatest in the lower lobes. Small-moderate right and small left pleural effusions. No pneumothorax. Upper Abdomen: No acute abnormality. Musculoskeletal: Thoracic spondylosis.  No acute abnormality. Review of the MIP images confirms the above findings. IMPRESSION: 1. No acute pulmonary embolism. 2. Findings compatible with bronchopneumonia. This may be secondary to aspiration given fluid and debris in the esophagus. 3. Small-moderate right and small left pleural effusions. 4. Patulous esophagus containing fluid and debris extending into the upper esophagus. Aortic Atherosclerosis (ICD10-I70.0). Electronically Signed   By: Minerva Fester M.D.   On: 05/10/2022 00:59   DG Chest 2 View  Result Date: 05/09/2022 CLINICAL DATA:  atrial fibrillation. EXAM: CHEST - 2 VIEW COMPARISON:  05/19/2021 FINDINGS: Heart size is mildly enlarged. Small bilateral pleural effusions are new compared with the previous exam. Pulmonary vascular congestion identified. No frank interstitial edema. Mild atelectasis noted within the lung bases. Spondylosis within  the thoracic spine. IMPRESSION: 1. New small bilateral pleural effusions. 2. Pulmonary vascular congestion. Electronically Signed   By: Signa Kell M.D.   On: 05/09/2022 13:18     Assessment and Plan:   New onset atrial fibrillation Secondary hypercoagulable state, CHA2DS2-VASc Score = 4   Patient with a-fib reported by Apple Watch. Denies palpitations, chest pain, shortness of breath. I personally reviewed apple watch data which shows onset of afib around 4/10. 2 prior ECG tracings from watch (lead I) from 3/19 and 4/5 do not clearly reveal afib.  TSH/T4 reassuring  Patient remains rate controlled at the current time even without rate limiting medication. Given stable rate control and nocturnal bradycardia (not new, apple watch data shows chronic nighttime bradycardia) would not plan to start beta-blocker or calcium channel blocker. Continue Eliquis 5 mg twice daily. We will set up patient for outpatient follow-up with A-fib clinic in 3 weeks. Apple Watch settings have been adjusted to record afib burden. Consider outpatient sleep study  Pleural effusions  No evidence of CHF on physical exam. TTE with LVEF 50-55%, RA pressure . Patient did received Lasix 20mg  this AM as ordered by primary team.  Hypertension  Plan to continue home Lisinopril at discharge.  Risk Assessment/Risk Scores:          CHA2DS2-VASc Score = 4   This indicates a 4.8% annual risk of stroke. The patient's score is based upon: CHF History: 0 HTN History: 1 Diabetes History: 0 Stroke History: 0 Vascular Disease History: 0 Age Score: 2 Gender Score: 1         For questions or updates, please contact [facility] Please consult [email or web address] for contact info under    Signed, Perlie Gold, PA-C  05/10/2022 11:55 AM

## 2022-05-10 NOTE — ED Notes (Signed)
Daughter with pt.. Eating lunch

## 2022-05-10 NOTE — TOC Benefit Eligibility Note (Signed)
Patient Product/process development scientist completed.    The patient is currently admitted and upon discharge could be taking Eliquis.  The current 30 day co-pay is $96.67  The patient is insured through [employer]   This test claim was processed through [facility]- copay amounts may vary at other pharmacies due to [employer], or as the patient moves through the different stages of their insurance plan.

## 2022-05-10 NOTE — ED Notes (Signed)
VAS at bedside.

## 2022-05-10 NOTE — Discharge Summary (Signed)
Physician Discharge Summary  Sandy Trujillo XNT:[id number] DOB: [birth year] DOA: 05/09/2022  PCP: Thana Ates, MD  Admit date: 05/09/2022 Discharge date: 05/10/2022    Admitted From: Home Disposition: Home  Recommendations for Outpatient Follow-up:  Follow up with PCP in 1-2 weeks Please obtain BMP/CBC in one week Follow-up with cardiology in 3 weeks as they have a scheduled. Please follow up with your PCP on the following pending results: Unresulted Labs (From admission, onward)     Start     Ordered   05/10/22 0849  C Difficile Quick Screen w PCR reflex  (C Difficile quick screen w PCR reflex panel )  Once, for 24 hours,   TIMED       References:    CDiff Information Tool   05/10/22 0848   05/10/22 0849  Procalcitonin  Once,   R       References:    Procalcitonin Lower Respiratory Tract Infection AND Sepsis Procalcitonin Algorithm   05/10/22 0848   05/09/22 2348  Iron and TIBC  Add-on,   AD        05/09/22 2347   05/09/22 2309  Brain natriuretic peptide  Add-on,   AD        05/09/22 2308   05/09/22 1416  Occult blood card to lab, stool  Once,   URGENT        05/09/22 1415              Home Health: Yes Equipment/Devices: None  Discharge Condition: Stable CODE STATUS: Full code Diet recommendation: Cardiac  Following HPI and ED course is copied from admitting hospitalist H&P. 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  Patient's daughter gives the majority of the history.  Her daughter Sandy Trujillo states the patient's Apple Watch has been telling her the patient has been having atrial fibrillation for the last several days.   Patient herself has been feeling somewhat tired.   No chest pain or palpitations.   Patient started on Zoloft 1 month ago due to some situational  anxiety.  Daughter states that the patient's neighbor recently had their granddaughter move into their home.  This reported neighbors granddaughter is a convicted felon.  This gave the patient anxiety.  Apparently this person verbally confronted the patient about staring out the window and looking at her.  This gave the patient some fear.  Patient was started on Zoloft.  She states that it did help but caused lots of diarrhea.  Zoloft was weaned off and the patient was started on BuSpar last week.  Since starting BuSpar, the patient has been feeling increasingly tired.   Patient went to the [facility] urgent care clinic today.  EKG showed rate controlled A-fib.  Patient was transferred to the ER for evaluation.   On arrival temp 98 heart rate 74 blood pressure 164/85 satting high percent on room air.   Labs showed a sodium 131, potassium 4.4, BUN 23, creatinine 0.7   White count 4.6, hemoglobin 9.9, platelets of 220   TSH normal at 2.4, magnesium 1.8   CTPA performed, final results not yet reported.   [facility] hospital[facility] contacted for admission.   Subjective: Seen and examined this morning.  2 daughters at the bedside.  Patient states that she is feeling better.  No palpitation, chest pain or any other  complaint.  Brief/Interim Summary: Briefly, patient was admitted due to newly discovered paroxysmal atrial fibrillation which was rate controlled.  Thyroid studies were normal.  She had very mild right pleural effusion.  She received Lasix in the ED.  Patient was started on Eliquis.  Seen by cardiology.  Since patient's rates are controlled with intermittent bradycardia, cardiology recommended not starting any rate control medications and in fact recommended outpatient sleep study to rule out obstructive sleep apnea and they will follow-up with the patient in the clinic in 3 weeks.  They recommended resuming Eliquis.  Patient is stable and is being discharged.  Discharge plan was discussed with patient  and/or family member and they verbalized understanding and agreed with it.  Discharge Diagnoses:  Principal Problem:   PAF (paroxysmal atrial fibrillation) Active Problems:   Pleural effusion on right   ANXIETY   Essential hypertension   Ileostomy in place   Normocytic anemia    Discharge Instructions   Allergies as of 05/10/2022       Reactions   Buspar [buspirone] Other (See Comments)   Increased risk for Afib   Risedronate Other (See Comments)   Lowered BP   Sertraline Diarrhea        Medication List     TAKE these medications    apixaban 5 MG Tabs tablet Commonly known as: ELIQUIS Take 1 tablet (5 mg total) by mouth 2 (two) times daily.   CENTRUM SILVER PO Take 1 tablet by mouth daily.   cyanocobalamin 500 MCG tablet Commonly known as: VITAMIN B12 Take 500 mcg by mouth daily.   lisinopril 20 MG tablet Commonly known as: ZESTRIL Take 20 mg by mouth daily.   loperamide 2 MG capsule Commonly known as: IMODIUM Take 2 mg by mouth as needed for diarrhea or loose stools.   loratadine 10 MG tablet Commonly known as: CLARITIN Take 5 mg by mouth daily.   Vitamin D3 Super Strength 50 MCG (2000 UT) Tabs Generic drug: Cholecalciferol Take 2,000 Units by mouth daily.        Follow-up Information     Thana Ates, MD Follow up in 1 week(s).   Specialty: Internal Medicine Contact information: [address] [place] [address] [phone]                Allergies  Allergen Reactions   Buspar [Buspirone] Other (See Comments)    Increased risk for Afib   Risedronate Other (See Comments)    Lowered BP   Sertraline Diarrhea    Consultations: Cardiology   Procedures/Studies: ECHOCARDIOGRAM COMPLETE  Result Date: 05/10/2022    ECHOCARDIOGRAM REPORT   Patient Name:   Sandy Trujillo Date of Exam: 05/10/2022 Medical Rec #:  [id number]         Height:       60.0 in Accession #:    [id number]        Weight:       141.1 lb Date  of Birth:  [birth year]        BSA:          1.609 m Patient Age:    89 years          BP:           134/75 mmHg Patient Gender: F                 HR:           63 bpm. Exam Location:  Inpatient  Procedure: 2D Echo, Cardiac Doppler and Color Doppler Indications:    atrial fibrillation  History:        Patient has no prior history of Echocardiogram examinations.                 Risk Factors:Hypertension and Dyslipidemia.  Sonographer:    Delcie Roch RDCS Referring Phys: [phone] ERIC CHEN IMPRESSIONS  1. Left ventricular ejection fraction, by estimation, is 50 to 55%. The left ventricle has low normal function. The left ventricle has no regional wall motion abnormalities. Left ventricular diastolic parameters are indeterminate.  2. Right ventricular systolic function is mildly reduced. The right ventricular size is mildly enlarged. There is mildly elevated pulmonary artery systolic pressure.  3. Left atrial size was severely dilated.  4. Right atrial size was moderately dilated.  5. The mitral valve is normal in structure. Moderate mitral valve regurgitation. No evidence of mitral stenosis.  6. Tricuspid valve regurgitation is moderate.  7. The aortic valve is tricuspid. There is mild calcification of the aortic valve. There is mild thickening of the aortic valve. Aortic valve regurgitation is not visualized. No aortic stenosis is present.  8. The inferior vena cava is normal in size with greater than 50% respiratory variability, suggesting right atrial pressure of 3 mmHg. FINDINGS  Left Ventricle: Left ventricular ejection fraction, by estimation, is 50 to 55%. The left ventricle has low normal function. The left ventricle has no regional wall motion abnormalities. The left ventricular internal cavity size was normal in size. There is no left ventricular hypertrophy. Left ventricular diastolic function could not be evaluated due to atrial fibrillation. Left ventricular diastolic parameters are indeterminate. Right  Ventricle: The right ventricular size is mildly enlarged. No increase in right ventricular wall thickness. Right ventricular systolic function is mildly reduced. There is mildly elevated pulmonary artery systolic pressure. The tricuspid regurgitant  velocity is 3.03 m/s, and with an assumed right atrial pressure of 3 mmHg, the estimated right ventricular systolic pressure is 39.7 mmHg. Left Atrium: Left atrial size was severely dilated. Right Atrium: Right atrial size was moderately dilated. Pericardium: There is no evidence of pericardial effusion. Mitral Valve: The mitral valve is normal in structure. Moderate mitral valve regurgitation. No evidence of mitral valve stenosis. Tricuspid Valve: The tricuspid valve is normal in structure. Tricuspid valve regurgitation is moderate . No evidence of tricuspid stenosis. Aortic Valve: The aortic valve is tricuspid. There is mild calcification of the aortic valve. There is mild thickening of the aortic valve. Aortic valve regurgitation is not visualized. No aortic stenosis is present. Pulmonic Valve: The pulmonic valve was normal in structure. Pulmonic valve regurgitation is not visualized. No evidence of pulmonic stenosis. Aorta: The aortic root is normal in size and structure. Venous: The inferior vena cava is normal in size with greater than 50% respiratory variability, suggesting right atrial pressure of 3 mmHg. IAS/Shunts: No atrial level shunt detected by color flow Doppler.  LEFT VENTRICLE PLAX 2D LVIDd:         4.50 cm LVIDs:         2.70 cm LV PW:         0.90 cm LV IVS:        0.60 cm LVOT diam:     1.70 cm LVOT Area:     2.27 cm  RIGHT VENTRICLE          IVC RV Basal diam:  2.50 cm  IVC diam: 2.00 cm TAPSE (M-mode): 1.2 cm LEFT ATRIUM  Index        RIGHT ATRIUM           Index LA diam:        4.00 cm  2.49 cm/m   RA Area:     20.50 cm LA Vol (A2C):   100.0 ml 62.14 ml/m  RA Volume:   55.20 ml  34.30 ml/m LA Vol (A4C):   65.5 ml  40.70 ml/m LA  Biplane Vol: 82.5 ml  51.26 ml/m   AORTA Ao Root diam: 2.60 cm Ao Asc diam:  2.80 cm TRICUSPID VALVE TR Peak grad:   36.7 mmHg TR Vmax:        303.00 cm/s  SHUNTS Systemic Diam: 1.70 cm Kardie Tobb DO Electronically signed by Thomasene Ripple DO Signature Date/Time: 05/10/2022/12:26:18 PM    Final    VAS [place] LOWER EXTREMITY VENOUS (DVT) (ONLY [facility] & [facility])  Result Date: 05/10/2022  Lower Venous DVT Study Patient Name:  TRINETTE VERA  Date of Exam:   05/09/2022 Medical Rec #: [id number]          Accession #:    [id number] Date of Birth: Jan 04, 1933         Patient Gender: F Patient Age:   73 years Exam Location:  [facility] Procedure:      VAS [place] LOWER EXTREMITY VENOUS (DVT) Referring Phys: Ernie Avena --------------------------------------------------------------------------------  Indications: Edema. Other Indications: New onset atrial fibrillation. Risk Factors: Ileostomy. Limitations: Clothing/ileostomy bag. Comparison Study: No prior study on file Performing Technologist: Sherren Kerns RVS  Examination Guidelines: A complete evaluation includes B-mode imaging, spectral Doppler, color Doppler, and power Doppler as needed of all accessible portions of each vessel. Bilateral testing is considered an integral part of a complete examination. Limited examinations for reoccurring indications may be performed as noted. The reflux portion of the exam is performed with the patient in reverse Trendelenburg.  +---------+---------------+---------+-----------+---------------+--------------+ RIGHT    CompressibilityPhasicitySpontaneityProperties     Thrombus Aging +---------+---------------+---------+-----------+---------------+--------------+ CFV                                         pulsatile      patent by                                                  waveforms      color and                                                                 Doppler         +---------+---------------+---------+-----------+---------------+--------------+ SFJ      Full                                                             +---------+---------------+---------+-----------+---------------+--------------+ FV Prox  Full  pulsatile                                                                 waveforms                     +---------+---------------+---------+-----------+---------------+--------------+ FV Mid   Full                               pulsatile                                                                 waveforms                     +---------+---------------+---------+-----------+---------------+--------------+ FV DistalFull                                                             +---------+---------------+---------+-----------+---------------+--------------+ PFV                                         pulsatile                                                                 waveforms                     +---------+---------------+---------+-----------+---------------+--------------+ POP      Full                               pulsatile                                                                 waveforms                     +---------+---------------+---------+-----------+---------------+--------------+ PTV      Full                                                             +---------+---------------+---------+-----------+---------------+--------------+ PERO     Full                                                             +---------+---------------+---------+-----------+---------------+--------------+   +---------+---------------+---------+-----------+---------------+--------------+  LEFT     CompressibilityPhasicitySpontaneityProperties     Thrombus Aging +---------+---------------+---------+-----------+---------------+--------------+  CFV      Full                               pulsatile                                                                 waveforms                     +---------+---------------+---------+-----------+---------------+--------------+ SFJ      Full                                                             +---------+---------------+---------+-----------+---------------+--------------+ FV Prox  Full                               pulsatile                                                                 waveforms                     +---------+---------------+---------+-----------+---------------+--------------+ FV Mid   Full                                                             +---------+---------------+---------+-----------+---------------+--------------+ FV DistalFull                               pulsatile                                                                 waveforms                     +---------+---------------+---------+-----------+---------------+--------------+ PFV                                                        Not well  visualized     +---------+---------------+---------+-----------+---------------+--------------+ POP      Full                               pulsatile                                                                 waveforms                     +---------+---------------+---------+-----------+---------------+--------------+ PTV      Full                                                             +---------+---------------+---------+-----------+---------------+--------------+ PERO     Full                                                             +---------+---------------+---------+-----------+---------------+--------------+     Summary: RIGHT: - There is no evidence of deep vein thrombosis in the lower  extremity. However, portions of this examination were limited- see technologist comments above.  pulsatile waveforms and interstitial edema consistent with fluid overload  LEFT: - There is no evidence of deep vein thrombosis in the lower extremity. However, portions of this examination were limited- see technologist comments above.  Pulsatile waveforms and interstitial edema consistent with fluid overload.  *See table(s) above for measurements and observations. Electronically signed by Coral Else MD on 05/10/2022 at 8:06:15 AM.    Final    CT Angio Chest PE W and/or Wo Contrast  Result Date: 05/10/2022 CLINICAL DATA:  Weakness.  Atrial fibrillation.  Positive D-dimer. EXAM: CT ANGIOGRAPHY CHEST WITH CONTRAST TECHNIQUE: Multidetector CT imaging of the chest was performed using the standard protocol during bolus administration of intravenous contrast. Multiplanar CT image reconstructions and MIPs were obtained to evaluate the vascular anatomy. RADIATION DOSE REDUCTION: This exam was performed according to the departmental dose-optimization program which includes automated exposure control, adjustment of the mA and/or kV according to patient size and/or use of iterative reconstruction technique. CONTRAST:  55mL OMNIPAQUE IOHEXOL 350 MG/ML SOLN COMPARISON:  Chest radiographs 05/09/2022 and CT chest 03/26/2010 report; images from CT chest 03/29/2008. FINDINGS: Cardiovascular: Satisfactory opacification of the pulmonary arteries to the segmental level. No acute pulmonary embolism. Cardiomegaly. No pericardial effusion. Coronary artery and aortic atherosclerotic calcification. Mediastinum/Nodes: Patulous esophagus containing fluid and debris extending into the upper esophagus. No thoracic adenopathy. Lungs/Pleura: Patchy bilateral centrilobular ground-glass micro nodules. Mild bronchial wall thickening and mucous plugging greatest in the lower lobes. Small-moderate right and small left pleural effusions. No  pneumothorax. Upper Abdomen: No acute abnormality. Musculoskeletal: Thoracic spondylosis.  No acute abnormality. Review of the MIP images confirms the above findings. IMPRESSION: 1. No acute pulmonary embolism. 2. Findings compatible with bronchopneumonia. This may be secondary to aspiration given fluid and debris in the esophagus.  3. Small-moderate right and small left pleural effusions. 4. Patulous esophagus containing fluid and debris extending into the upper esophagus. Aortic Atherosclerosis (ICD10-I70.0). Electronically Signed   By: Minerva Fester M.D.   On: 05/10/2022 00:59   DG Chest 2 View  Result Date: 05/09/2022 CLINICAL DATA:  atrial fibrillation. EXAM: CHEST - 2 VIEW COMPARISON:  05/19/2021 FINDINGS: Heart size is mildly enlarged. Small bilateral pleural effusions are new compared with the previous exam. Pulmonary vascular congestion identified. No frank interstitial edema. Mild atelectasis noted within the lung bases. Spondylosis within the thoracic spine. IMPRESSION: 1. New small bilateral pleural effusions. 2. Pulmonary vascular congestion. Electronically Signed   By: Signa Kell M.D.   On: 05/09/2022 13:18     Discharge Exam: Vitals:   05/10/22 1126 05/10/22 1200  BP:  (!) 104/93  Pulse:  69  Resp:  17  Temp: 97.6 F (36.4 C)   SpO2:  98%   Vitals:   05/10/22 0933 05/10/22 0934 05/10/22 1126 05/10/22 1200  BP: 134/75   (!) 104/93  Pulse:  70  69  Resp:  19  17  Temp:   97.6 F (36.4 C)   TempSrc:      SpO2:  98%  98%  Weight:      Height:        General: Pt is alert, awake, not in acute distress Cardiovascular: Irregularly irregular RR, S1/S2 +, no rubs, no gallops Respiratory: CTA bilaterally, no wheezing, no rhonchi Abdominal: Soft, NT, ND, bowel sounds + Extremities: no edema, no cyanosis    The results of significant diagnostics from this hospitalization (including imaging, microbiology, ancillary and laboratory) are listed below for reference.      Microbiology: No results found for this or any previous visit (from the past 240 hour(s)).   Labs: BNP (last 3 results) No results for input(s): "BNP" in the last 8760 hours. Basic Metabolic Panel: Recent Labs  Lab 05/09/22 1225 05/09/22 1306 05/10/22 0430  NA 131*  --  132*  K 4.4  --  3.7  CL 95*  --  96*  CO2 27  --  27  GLUCOSE 107*  --  69*  BUN 23  --  20  CREATININE 0.74  --  0.79  CALCIUM 9.6  --  9.4  MG  --  1.8 2.2   Liver Function Tests: Recent Labs  Lab 05/09/22 1225 05/10/22 0430  AST 43* 42*  ALT 45* 42  ALKPHOS 88 80  BILITOT 0.7 0.6  PROT 7.2 6.5  ALBUMIN 3.5 3.2*   No results for input(s): "LIPASE", "AMYLASE" in the last 168 hours. No results for input(s): "AMMONIA" in the last 168 hours. CBC: Recent Labs  Lab 05/09/22 1225 05/10/22 0430  WBC 4.6 4.4  NEUTROABS  --  2.6  HGB 9.9* 9.6*  HCT 30.1* 29.6*  MCV 83.1 82.9  PLT 220 203   Cardiac Enzymes: No results for input(s): "CKTOTAL", "CKMB", "CKMBINDEX", "TROPONINI" in the last 168 hours. BNP: Invalid input(s): "POCBNP" CBG: No results for input(s): "GLUCAP" in the last 168 hours. D-Dimer Recent Labs    05/09/22 1306  DDIMER 1.07*   Hgb A1c No results for input(s): "HGBA1C" in the last 72 hours. Lipid Profile No results for input(s): "CHOL", "HDL", "LDLCALC", "TRIG", "CHOLHDL", "LDLDIRECT" in the last 72 hours. Thyroid function studies Recent Labs    05/09/22 1225  TSH 2.428   Anemia work up No results for input(s): "VITAMINB12", "FOLATE", "FERRITIN", "TIBC", "IRON", "RETICCTPCT" in the last  72 hours. Urinalysis No results found for: "COLORURINE", "APPEARANCEUR", "LABSPEC", "PHURINE", "GLUCOSEU", "HGBUR", "BILIRUBINUR", "KETONESUR", "PROTEINUR", "UROBILINOGEN", "NITRITE", "LEUKOCYTESUR" Sepsis Labs Recent Labs  Lab 05/09/22 1225 05/10/22 0430  WBC 4.6 4.4   Microbiology No results found for this or any previous visit (from the past 240 hour(s)).   Time  coordinating discharge: Over 30 minutes  SIGNED:   Hughie Closs, MD  [facility] 05/10/2022, 2:46 PM *Please note that this is a verbal dictation therefore any spelling or grammatical errors are due to the "[employer]agon [employer] One" system interpretation. If 7PM-7AM, please contact night-coverage [email or web address]

## 2022-05-10 NOTE — Evaluation (Signed)
Physical Therapy Evaluation Patient Details Name: Sandy Trujillo MRN: [id number] DOB: 12/28/1932 Today's Date: 05/10/2022  History of Present Illness  89 y.o. female presents to [facility] hospital on 05/09/2022 with new onset afib. Pt also found to have R pleural effusion. PMH includes HTN, anxiety, anal cancer s.p colectomy and ileostomy.  Clinical Impression  Pt presents to PT with deficits in strength, power, gait, balance, endurance. Pt and family report an acute decline in strength and mobility in the last week, but also note a gradual decline in mobility since December. Pt requires some assistance to stand from taller ED stretcher at this time, but demonstrates improved stability in standing with BUE support of RW. Pt will benefit from frequent mobilization while admitted to aide in improving strength and endurance. PT recommends the pt discharge home with HHPT and a rollator. Pt will benefit from assistance from family initially at the time of discharge.       Recommendations for follow up therapy are one component of a multi-disciplinary discharge planning process, led by the attending physician.  Recommendations may be updated based on patient status, additional functional criteria and insurance authorization.  Follow Up Recommendations       Assistance Recommended at Discharge Intermittent Supervision/Assistance  Patient can return home with the following  A little help with walking and/or transfers;A little help with bathing/dressing/bathroom;Assistance with cooking/housework;Assist for transportation;Help with stairs or ramp for entrance    Equipment Recommendations Rollator (4 wheels)  Recommendations for Other Services       Functional Status Assessment Patient has had a recent decline in their functional status and demonstrates the ability to make significant improvements in function in a reasonable and predictable amount of time.     Precautions / Restrictions  Precautions Precautions: Fall Restrictions Weight Bearing Restrictions: No      Mobility  Bed Mobility Overal bed mobility: Needs Assistance Bed Mobility: Supine to Sit, Sit to Supine     Supine to sit: Min guard, HOB elevated Sit to supine: Min assist        Transfers Overall transfer level: Needs assistance Equipment used: 2 person hand held assist Transfers: Sit to/from Stand Sit to Stand: Min assist           General transfer comment: posterior lean, pts feet unable to touch floor from taller stretcher    Ambulation/Gait Ambulation/Gait assistance: Min guard Gait Distance (Feet): 60 Feet Assistive device: Rolling walker (2 wheels) Gait Pattern/deviations: Step-through pattern Gait velocity: reduced Gait velocity interpretation: <1.8 ft/sec, indicate of risk for recurrent falls   General Gait Details: slowed step-through gait with reduced stride length  Stairs            Wheelchair Mobility    Modified Rankin (Stroke Patients Only)       Balance Overall balance assessment: Needs assistance Sitting-balance support: No upper extremity supported, Feet supported Sitting balance-Leahy Scale: Good     Standing balance support: Single extremity supported, Bilateral upper extremity supported, Reliant on assistive device for balance Standing balance-Leahy Scale: Poor                               Pertinent Vitals/Pain Pain Assessment Pain Assessment: Faces Faces Pain Scale: Hurts little more Pain Location: R ankle Pain Descriptors / Indicators: Aching Pain Intervention(s): Monitored during session    Home Living Family/patient expects to be discharged to:: Private residence Living Arrangements: Alone Available Help at Discharge: Family;Available PRN/intermittently Type  of Home: House Home Access: Stairs to enter Entrance Stairs-Rails: Can reach both Entrance Stairs-Number of Steps: 4   Home Layout: One level Home Equipment:  Cane - single point;Standard Environmental consultant      Prior Function Prior Level of Function : Independent/Modified Independent             Mobility Comments: family have noticed a gradual decline in recent months, pt began utilizing a standard walker this week due to weakness but was using a SPC prior       Hand Dominance        Extremity/Trunk Assessment   Upper Extremity Assessment Upper Extremity Assessment: Generalized weakness    Lower Extremity Assessment Lower Extremity Assessment: Generalized weakness    Cervical / Trunk Assessment Cervical / Trunk Assessment: Kyphotic  Communication   Communication: No difficulties  Cognition Arousal/Alertness: Awake/alert Behavior During Therapy: WFL for tasks assessed/performed Overall Cognitive Status: Within Functional Limits for tasks assessed                                          General Comments General comments (skin integrity, edema, etc.): VSS on RA, pt in afib but rate controlled    Exercises     Assessment/Plan    PT Assessment Patient needs continued PT services  PT Problem List Decreased strength;Decreased activity tolerance;Decreased balance;Decreased mobility;Decreased knowledge of use of DME       PT Treatment Interventions Gait training;DME instruction;Stair training;Functional mobility training;Therapeutic activities;Therapeutic exercise;Balance training;Neuromuscular re-education;Patient/family education    PT Goals (Current goals can be found in the Care Plan section)  Acute Rehab PT Goals Patient Stated Goal: to return home, walk without DME PT Goal Formulation: With patient Time For Goal Achievement: 05/24/22 Potential to Achieve Goals: Fair    Frequency Min 3X/week     Co-evaluation               AM-PAC PT "6 Clicks" Mobility  Outcome Measure Help needed turning from your back to your side while in a flat bed without using bedrails?: A Little Help needed moving from  lying on your back to sitting on the side of a flat bed without using bedrails?: A Little Help needed moving to and from a bed to a chair (including a wheelchair)?: A Little Help needed standing up from a chair using your arms (e.g., wheelchair or bedside chair)?: A Little Help needed to walk in hospital room?: A Little Help needed climbing 3-5 steps with a railing? : A Little 6 Click Score: 18    End of Session   Activity Tolerance: Patient tolerated treatment well Patient left: in bed;with call bell/phone within reach;with family/visitor present Nurse Communication: Mobility status PT Visit Diagnosis: Other abnormalities of gait and mobility (R26.89);Muscle weakness (generalized) (M62.81)    Time: 1610-9604 PT Time Calculation (min) (ACUTE ONLY): 37 min   Charges:   PT Evaluation $PT Eval Low Complexity: 1 Low          Arlyss Gandy, PT, DPT Acute Rehabilitation Office [phone]   Arlyss Gandy 05/10/2022, 10:46 AM

## 2022-05-10 NOTE — Progress Notes (Signed)
  Echocardiogram 2D Echocardiogram has been performed.  Sandy Trujillo 05/10/2022, 10:21 AM

## 2022-05-10 NOTE — Telephone Encounter (Addendum)
Left message for patient to call back    ----- Message from Perlie Gold, PA-C sent at 05/10/2022  3:38 PM EDT ----- Regarding: outpatient lab This is a patient Dr. Duke Salvia and I saw in consult for new afib. I have her set up to be seen in afib clinic in 2 weeks but she was slightly anemic and needs repeat CBC at the end of this week. Dr. Duke Salvia and I will monitor results but it would be a tremendous help if someone could call patient and explain lab process at [facility] (although she could have CBC drawn at another clinic as well).  Thanks so much!  Perlie Gold PA-C

## 2022-05-10 NOTE — Telephone Encounter (Signed)
Pharmacy Patient Advocate Encounter  Insurance verification completed.    The patient is insured through [employer]   The patient is currently admitted and ran test claims for the following: Eliquis.  Copays and coinsurance results were relayed to Inpatient clinical team.  This test claim was processed through [facility]- copay amounts may vary at other pharmacies due to pharmacy/plan contracts, or as the patient moves through the different stages of their insurance plan.

## 2022-05-10 NOTE — ED Notes (Signed)
Placed pt on bedpan, emptied 100 ml of stool from colostomy bag. No acute distress noted. Call bell in reach.

## 2022-05-10 NOTE — ED Notes (Signed)
ED TO INPATIENT HANDOFF REPORT  ED Nurse Name and Phone #: Donnella Bi [phone]  S Name/Age/Gender Sandy Trujillo 89 y.o. female Room/Bed: 037C/037C  Code Status   Code Status: Full Code  Home/SNF/Other Home Patient oriented to: self, place, time, and situation Is this baseline? Yes   Triage Complete: Triage complete  Chief Complaint PAF (paroxysmal atrial fibrillation) [I48.0]  Triage Note PT brought here by daughter for new onset afib.  Pt is normally self-sufficient and ambulatory.  This week she has needed a walker and a wheel chair.  Recently placed on zoloft and hydroxzzine and then buspirone.  However, last night, pt watch alarmed afib.  [facility] sent pt here for new onset afib.   Allergies Allergies  Allergen Reactions   Buspar [Buspirone] Other (See Comments)    Increased risk for Afib   Risedronate Other (See Comments)    Lowered BP   Sertraline Diarrhea    Level of Care/Admitting Diagnosis ED Disposition     ED Disposition  Admit   Condition  --   Comment  Hospital Area: [facility] [[id number]]  Level of Care: Telemetry Cardiac [[id number]]  May place patient in observation at [facility] or [facility] if equivalent level of care is available:: No  Covid Evaluation: Asymptomatic - no recent exposure (last 10 days) testing not required  Diagnosis: PAF (paroxysmal atrial fibrillation) [454098]  Admitting Physician: Imogene Burn, ERIC [[id number]]  Attending Physician: Imogene Burn, ERIC [[id number]]          B Medical/Surgery History Past Medical History:  Diagnosis Date   Anemia    Anxiety    GERD (gastroesophageal reflux disease)    Hyperparathyroidism 2019   Hypertension    Ileostomy in place    Osteoporosis 2019   Pneumonia    hx of   rectal ca 2007   surg only   Past Surgical History:  Procedure Laterality Date   ABDOMINAL HYSTERECTOMY     BREAST EXCISIONAL BIOPSY Bilateral    benign   COLON SURGERY  2007   PARATHYROIDECTOMY N/A  05/26/2021   Procedure: NECK EXPLORATION WITH RIGHT INFERIOR PARATHYROIDECTOMY;  Surgeon: Darnell Level, MD;  Location: [facility];  Service: General;  Laterality: N/A;   surgery for spin al stenosis      bone spur touching nerve per pt     A IV Location/Drains/Wounds Patient Lines/Drains/Airways Status     Active Line/Drains/Airways     Name Placement date Placement time Site Days   Peripheral IV 05/09/22 20 G Right;Posterior;Medial Forearm 05/09/22  1641  Forearm  1   Peripheral IV 05/09/22 22 G Anterior;Left Wrist 05/09/22  1641  Wrist  1            Intake/Output Last 24 hours  Intake/Output Summary (Last 24 hours) at 05/10/2022 1447 Last data filed at 05/10/2022 1191 Gross per 24 hour  Intake 50 ml  Output 200 ml  Net -150 ml    Labs/Imaging Results for orders placed or performed during the hospital encounter of 05/09/22 (from the past 48 hour(s))  CBC     Status: Abnormal   Collection Time: 05/09/22 12:25 PM  Result Value Ref Range   WBC 4.6 4.0 - 10.5 K/uL   RBC 3.62 (L) 3.87 - 5.11 MIL/uL   Hemoglobin 9.9 (L) 12.0 - 15.0 g/dL   HCT 47.8 (L) 29.5 - 62.1 %   MCV 83.1 80.0 - 100.0 fL   MCH 27.3 26.0 - 34.0 pg   MCHC 32.9  30.0 - 36.0 g/dL   RDW 78.2 95.6 - 21.3 %   Platelets 220 150 - 400 K/uL   nRBC 0.0 0.0 - 0.2 %    Comment: Performed at [facility], [address], [place], [place] [address]  TSH     Status: None   Collection Time: 05/09/22 12:25 PM  Result Value Ref Range   TSH 2.428 0.350 - 4.500 uIU/mL    Comment: Performed by a 3rd Generation assay with a functional sensitivity of <=0.01 uIU/mL. Performed at [facility], [address] [place], [place] [address]   Comprehensive metabolic panel     Status: Abnormal   Collection Time: 05/09/22 12:25 PM  Result Value Ref Range   Sodium 131 (L) 135 - 145 mmol/L   Potassium 4.4 3.5 - 5.1 mmol/L   Chloride 95 (L) 98 - 111 mmol/L   CO2 27 22 - 32 mmol/L   Glucose, Bld 107 (H) 70 - 99 mg/dL     Comment: Glucose reference range applies only to samples taken after fasting for at least 8 hours.   BUN 23 8 - 23 mg/dL   Creatinine, Ser 2.95 0.44 - 1.00 mg/dL   Calcium 9.6 8.9 - 28.4 mg/dL   Total Protein 7.2 6.5 - 8.1 g/dL   Albumin 3.5 3.5 - 5.0 g/dL   AST 43 (H) 15 - 41 U/L   ALT 45 (H) 0 - 44 U/L   Alkaline Phosphatase 88 38 - 126 U/L   Total Bilirubin 0.7 0.3 - 1.2 mg/dL   GFR, Estimated >13 >24 mL/min    Comment: (NOTE) Calculated using the CKD-EPI Creatinine Equation (2021)    Anion gap 9 5 - 15    Comment: Performed at [facility], [address], [place], [place] [address]  Troponin I (High Sensitivity)     Status: Abnormal   Collection Time: 05/09/22 12:25 PM  Result Value Ref Range   Troponin I (High Sensitivity) 20 (H) <18 ng/L    Comment: (NOTE) Elevated high sensitivity troponin I (hsTnI) values and significant  changes across serial measurements may suggest ACS but many other  chronic and acute conditions are known to elevate hsTnI results.  Refer to the "Links" section for chest pain algorithms and additional  guidance. Performed at [facility], [address], [place], [place] [address]   Troponin I (High Sensitivity)     Status: Abnormal   Collection Time: 05/09/22  1:06 PM  Result Value Ref Range   Troponin I (High Sensitivity) 20 (H) <18 ng/L    Comment: (NOTE) Elevated high sensitivity troponin I (hsTnI) values and significant  changes across serial measurements may suggest ACS but many other  chronic and acute conditions are known to elevate hsTnI results.  Refer to the "Links" section for chest pain algorithms and additional  guidance. Performed at [facility], [address] [place], [place] [address]   D-dimer, quantitative     Status: Abnormal   Collection Time: 05/09/22  1:06 PM  Result Value Ref Range   D-Dimer, Quant 1.07 (H) 0.00 - 0.50 ug/mL-FEU    Comment: (NOTE) At the manufacturer cut-off value of 0.5 g/mL  FEU, this assay has a negative predictive value of 95-100%.This assay is intended for use in conjunction with a clinical pretest probability (PTP) assessment model to exclude pulmonary embolism (PE) and deep venous thrombosis (DVT) in outpatients suspected of PE or DVT. Results should be correlated  with clinical presentation. Performed at [facility], [address] [place], [place] [address]   T4, free     Status: Abnormal   Collection Time: 05/09/22  1:06 PM  Result Value Ref Range   Free T4 1.22 (H) 0.61 - 1.12 ng/dL    Comment: (NOTE) Biotin ingestion may interfere with free T4 tests. If the results are inconsistent with the TSH level, previous test results, or the clinical presentation, then consider biotin interference. If needed, order repeat testing after stopping biotin. Performed at [facility], [address] [place], [place] [address]   Magnesium     Status: None   Collection Time: 05/09/22  1:06 PM  Result Value Ref Range   Magnesium 1.8 1.7 - 2.4 mg/dL    Comment: Performed at [facility], [address], [place], [place] [address]  Troponin I (High Sensitivity)     Status: Abnormal   Collection Time: 05/09/22  7:01 PM  Result Value Ref Range   Troponin I (High Sensitivity) 20 (H) <18 ng/L    Comment: (NOTE) Elevated high sensitivity troponin I (hsTnI) values and significant  changes across serial measurements may suggest ACS but many other  chronic and acute conditions are known to elevate hsTnI results.  Refer to the "Links" section for chest pain algorithms and additional  guidance. Performed at [facility], [address] [place], [place] [address]   Comprehensive metabolic panel     Status: Abnormal   Collection Time: 05/10/22  4:30 AM  Result Value Ref Range   Sodium 132 (L) 135 - 145 mmol/L   Potassium 3.7 3.5 - 5.1 mmol/L   Chloride 96 (L) 98 - 111 mmol/L   CO2 27 22 - 32 mmol/L   Glucose, Bld 69 (L) 70 - 99 mg/dL     Comment: Glucose reference range applies only to samples taken after fasting for at least 8 hours.   BUN 20 8 - 23 mg/dL   Creatinine, Ser 6.57 0.44 - 1.00 mg/dL   Calcium 9.4 8.9 - 84.6 mg/dL   Total Protein 6.5 6.5 - 8.1 g/dL   Albumin 3.2 (L) 3.5 - 5.0 g/dL   AST 42 (H) 15 - 41 U/L   ALT 42 0 - 44 U/L   Alkaline Phosphatase 80 38 - 126 U/L   Total Bilirubin 0.6 0.3 - 1.2 mg/dL   GFR, Estimated >96 >29 mL/min    Comment: (NOTE) Calculated using the CKD-EPI Creatinine Equation (2021)    Anion gap 9 5 - 15    Comment: Performed at [facility], [address], [place], [place] [address]  CBC with Differential/Platelet     Status: Abnormal   Collection Time: 05/10/22  4:30 AM  Result Value Ref Range   WBC 4.4 4.0 - 10.5 K/uL   RBC 3.57 (L) 3.87 - 5.11 MIL/uL   Hemoglobin 9.6 (L) 12.0 - 15.0 g/dL   HCT 32.4 (L) 40.1 - 02.7 %   MCV 82.9 80.0 - 100.0 fL   MCH 26.9 26.0 - 34.0 pg   MCHC 32.4 30.0 - 36.0 g/dL   RDW 25.3 66.4 - 40.3 %   Platelets 203 150 - 400 K/uL   nRBC 0.0 0.0 - 0.2 %   Neutrophils Relative % 59 %   Neutro Abs 2.6 1.7 - 7.7 K/uL   Lymphocytes Relative 24 %   Lymphs Abs 1.0 0.7 - 4.0 K/uL   Monocytes Relative 10 %  Monocytes Absolute 0.4 0.1 - 1.0 K/uL   Eosinophils Relative 6 %   Eosinophils Absolute 0.2 0.0 - 0.5 K/uL   Basophils Relative 1 %   Basophils Absolute 0.0 0.0 - 0.1 K/uL   Immature Granulocytes 0 %   Abs Immature Granulocytes 0.01 0.00 - 0.07 K/uL    Comment: Performed at [facility], [address], [place], [place] [address]  Magnesium     Status: None   Collection Time: 05/10/22  4:30 AM  Result Value Ref Range   Magnesium 2.2 1.7 - 2.4 mg/dL    Comment: Performed at [facility], [address] [place], [place] [address]   ECHOCARDIOGRAM COMPLETE  Result Date: 05/10/2022    ECHOCARDIOGRAM REPORT   Patient Name:   Sandy Trujillo Date of Exam: 05/10/2022 Medical Rec #:  [id number]         Height:       60.0 in  Accession #:    [id number]        Weight:       141.1 lb Date of Birth:  1932-08-24        BSA:          1.609 m Patient Age:    89 years          BP:           134/75 mmHg Patient Gender: F                 HR:           63 bpm. Exam Location:  Inpatient Procedure: 2D Echo, Cardiac Doppler and Color Doppler Indications:    atrial fibrillation  History:        Patient has no prior history of Echocardiogram examinations.                 Risk Factors:Hypertension and Dyslipidemia.  Sonographer:    Delcie Roch RDCS Referring Phys: [phone] ERIC CHEN IMPRESSIONS  1. Left ventricular ejection fraction, by estimation, is 50 to 55%. The left ventricle has low normal function. The left ventricle has no regional wall motion abnormalities. Left ventricular diastolic parameters are indeterminate.  2. Right ventricular systolic function is mildly reduced. The right ventricular size is mildly enlarged. There is mildly elevated pulmonary artery systolic pressure.  3. Left atrial size was severely dilated.  4. Right atrial size was moderately dilated.  5. The mitral valve is normal in structure. Moderate mitral valve regurgitation. No evidence of mitral stenosis.  6. Tricuspid valve regurgitation is moderate.  7. The aortic valve is tricuspid. There is mild calcification of the aortic valve. There is mild thickening of the aortic valve. Aortic valve regurgitation is not visualized. No aortic stenosis is present.  8. The inferior vena cava is normal in size with greater than 50% respiratory variability, suggesting right atrial pressure of 3 mmHg. FINDINGS  Left Ventricle: Left ventricular ejection fraction, by estimation, is 50 to 55%. The left ventricle has low normal function. The left ventricle has no regional wall motion abnormalities. The left ventricular internal cavity size was normal in size. There is no left ventricular hypertrophy. Left ventricular diastolic function could not be evaluated due to atrial fibrillation.  Left ventricular diastolic parameters are indeterminate. Right Ventricle: The right ventricular size is mildly enlarged. No increase in right ventricular wall thickness. Right ventricular systolic function is mildly reduced. There is mildly elevated pulmonary artery systolic pressure. The tricuspid regurgitant  velocity is 3.03  m/s, and with an assumed right atrial pressure of 3 mmHg, the estimated right ventricular systolic pressure is 39.7 mmHg. Left Atrium: Left atrial size was severely dilated. Right Atrium: Right atrial size was moderately dilated. Pericardium: There is no evidence of pericardial effusion. Mitral Valve: The mitral valve is normal in structure. Moderate mitral valve regurgitation. No evidence of mitral valve stenosis. Tricuspid Valve: The tricuspid valve is normal in structure. Tricuspid valve regurgitation is moderate . No evidence of tricuspid stenosis. Aortic Valve: The aortic valve is tricuspid. There is mild calcification of the aortic valve. There is mild thickening of the aortic valve. Aortic valve regurgitation is not visualized. No aortic stenosis is present. Pulmonic Valve: The pulmonic valve was normal in structure. Pulmonic valve regurgitation is not visualized. No evidence of pulmonic stenosis. Aorta: The aortic root is normal in size and structure. Venous: The inferior vena cava is normal in size with greater than 50% respiratory variability, suggesting right atrial pressure of 3 mmHg. IAS/Shunts: No atrial level shunt detected by color flow Doppler.  LEFT VENTRICLE PLAX 2D LVIDd:         4.50 cm LVIDs:         2.70 cm LV PW:         0.90 cm LV IVS:        0.60 cm LVOT diam:     1.70 cm LVOT Area:     2.27 cm  RIGHT VENTRICLE          IVC RV Basal diam:  2.50 cm  IVC diam: 2.00 cm TAPSE (M-mode): 1.2 cm LEFT ATRIUM              Index        RIGHT ATRIUM           Index LA diam:        4.00 cm  2.49 cm/m   RA Area:     20.50 cm LA Vol (A2C):   100.0 ml 62.14 ml/m  RA Volume:    55.20 ml  34.30 ml/m LA Vol (A4C):   65.5 ml  40.70 ml/m LA Biplane Vol: 82.5 ml  51.26 ml/m   AORTA Trujillo Root diam: 2.60 cm Trujillo Asc diam:  2.80 cm TRICUSPID VALVE TR Peak grad:   36.7 mmHg TR Vmax:        303.00 cm/s  SHUNTS Systemic Diam: 1.70 cm Kardie Tobb DO Electronically signed by Thomasene Ripple DO Signature Date/Time: 05/10/2022/12:26:18 PM    Final    VAS [place] LOWER EXTREMITY VENOUS (DVT) (ONLY [facility] & [facility])  Result Date: 05/10/2022  Lower Venous DVT Study Patient Name:  Sandy Trujillo  Date of Exam:   05/09/2022 Medical Rec #: [id number]          Accession #:    [id number] Date of Birth: [birth year]         Patient Gender: F Patient Age:   63 years Exam Location:  [facility] Procedure:      VAS [place] LOWER EXTREMITY VENOUS (DVT) Referring Phys: Ernie Avena --------------------------------------------------------------------------------  Indications: Edema. Other Indications: New onset atrial fibrillation. Risk Factors: Ileostomy. Limitations: Clothing/ileostomy bag. Comparison Study: No prior study on file Performing Technologist: Sherren Kerns RVS  Examination Guidelines: A complete evaluation includes B-mode imaging, spectral Doppler, color Doppler, and power Doppler as needed of all accessible portions of each vessel. Bilateral testing is considered an integral part of a complete examination. Limited examinations for reoccurring indications may be performed as noted. The  reflux portion of the exam is performed with the patient in reverse Trendelenburg.  +---------+---------------+---------+-----------+---------------+--------------+ RIGHT    CompressibilityPhasicitySpontaneityProperties     Thrombus Aging +---------+---------------+---------+-----------+---------------+--------------+ CFV                                         pulsatile      patent by                                                  waveforms      color and                                                                  Doppler        +---------+---------------+---------+-----------+---------------+--------------+ SFJ      Full                                                             +---------+---------------+---------+-----------+---------------+--------------+ FV Prox  Full                               pulsatile                                                                 waveforms                     +---------+---------------+---------+-----------+---------------+--------------+ FV Mid   Full                               pulsatile                                                                 waveforms                     +---------+---------------+---------+-----------+---------------+--------------+ FV DistalFull                                                             +---------+---------------+---------+-----------+---------------+--------------+ PFV  pulsatile                                                                 waveforms                     +---------+---------------+---------+-----------+---------------+--------------+ POP      Full                               pulsatile                                                                 waveforms                     +---------+---------------+---------+-----------+---------------+--------------+ PTV      Full                                                             +---------+---------------+---------+-----------+---------------+--------------+ PERO     Full                                                             +---------+---------------+---------+-----------+---------------+--------------+   +---------+---------------+---------+-----------+---------------+--------------+ LEFT     CompressibilityPhasicitySpontaneityProperties     Thrombus Aging  +---------+---------------+---------+-----------+---------------+--------------+ CFV      Full                               pulsatile                                                                 waveforms                     +---------+---------------+---------+-----------+---------------+--------------+ SFJ      Full                                                             +---------+---------------+---------+-----------+---------------+--------------+ FV Prox  Full                               pulsatile  waveforms                     +---------+---------------+---------+-----------+---------------+--------------+ FV Mid   Full                                                             +---------+---------------+---------+-----------+---------------+--------------+ FV DistalFull                               pulsatile                                                                 waveforms                     +---------+---------------+---------+-----------+---------------+--------------+ PFV                                                        Not well                                                                  visualized     +---------+---------------+---------+-----------+---------------+--------------+ POP      Full                               pulsatile                                                                 waveforms                     +---------+---------------+---------+-----------+---------------+--------------+ PTV      Full                                                             +---------+---------------+---------+-----------+---------------+--------------+ PERO     Full                                                             +---------+---------------+---------+-----------+---------------+--------------+  Summary: RIGHT: - There is no evidence of deep vein thrombosis in the lower extremity. However, portions of this examination were limited- see technologist comments above.  pulsatile waveforms and interstitial edema consistent with fluid overload  LEFT: - There is no evidence of deep vein thrombosis in the lower extremity. However, portions of this examination were limited- see technologist comments above.  Pulsatile waveforms and interstitial edema consistent with fluid overload.  *See table(s) above for measurements and observations. Electronically signed by Coral Else MD on 05/10/2022 at 8:06:15 AM.    Final    CT Angio Chest PE W and/or Wo Contrast  Result Date: 05/10/2022 CLINICAL DATA:  Weakness.  Atrial fibrillation.  Positive D-dimer. EXAM: CT ANGIOGRAPHY CHEST WITH CONTRAST TECHNIQUE: Multidetector CT imaging of the chest was performed using the standard protocol during bolus administration of intravenous contrast. Multiplanar CT image reconstructions and MIPs were obtained to evaluate the vascular anatomy. RADIATION DOSE REDUCTION: This exam was performed according to the departmental dose-optimization program which includes automated exposure control, adjustment of the mA and/or kV according to patient size and/or use of iterative reconstruction technique. CONTRAST:  55mL OMNIPAQUE IOHEXOL 350 MG/ML SOLN COMPARISON:  Chest radiographs 05/09/2022 and CT chest 03/26/2010 report; images from CT chest 03/29/2008. FINDINGS: Cardiovascular: Satisfactory opacification of the pulmonary arteries to the segmental level. No acute pulmonary embolism. Cardiomegaly. No pericardial effusion. Coronary artery and aortic atherosclerotic calcification. Mediastinum/Nodes: Patulous esophagus containing fluid and debris extending into the upper esophagus. No thoracic adenopathy. Lungs/Pleura: Patchy bilateral centrilobular ground-glass micro nodules. Mild bronchial wall thickening and mucous plugging greatest in the  lower lobes. Small-moderate right and small left pleural effusions. No pneumothorax. Upper Abdomen: No acute abnormality. Musculoskeletal: Thoracic spondylosis.  No acute abnormality. Review of the MIP images confirms the above findings. IMPRESSION: 1. No acute pulmonary embolism. 2. Findings compatible with bronchopneumonia. This may be secondary to aspiration given fluid and debris in the esophagus. 3. Small-moderate right and small left pleural effusions. 4. Patulous esophagus containing fluid and debris extending into the upper esophagus. Aortic Atherosclerosis (ICD10-I70.0). Electronically Signed   By: Minerva Fester M.D.   On: 05/10/2022 00:59   DG Chest 2 View  Result Date: 05/09/2022 CLINICAL DATA:  atrial fibrillation. EXAM: CHEST - 2 VIEW COMPARISON:  05/19/2021 FINDINGS: Heart size is mildly enlarged. Small bilateral pleural effusions are new compared with the previous exam. Pulmonary vascular congestion identified. No frank interstitial edema. Mild atelectasis noted within the lung bases. Spondylosis within the thoracic spine. IMPRESSION: 1. New small bilateral pleural effusions. 2. Pulmonary vascular congestion. Electronically Signed   By: Signa Kell M.D.   On: 05/09/2022 13:18    Pending Labs Unresulted Labs (From admission, onward)     Start     Ordered   05/10/22 0849  C Difficile Quick Screen w PCR reflex  (C Difficile quick screen w PCR reflex panel )  Once, for 24 hours,   TIMED       References:    CDiff Information Tool   05/10/22 0848   05/10/22 0849  Procalcitonin  Once,   R       References:    Procalcitonin Lower Respiratory Tract Infection AND Sepsis Procalcitonin Algorithm   05/10/22 0848   05/09/22 2348  Iron and TIBC  Add-on,   AD        05/09/22 2347   05/09/22 2309  Brain natriuretic peptide  Add-on,   AD        05/09/22 2308   05/09/22  1416  Occult blood card to lab, stool  Once,   URGENT        05/09/22 1415            Vitals/Pain Today's Vitals    05/10/22 0933 05/10/22 0934 05/10/22 1126 05/10/22 1200  BP: 134/75   (!) 104/93  Pulse:  70  69  Resp:  19  17  Temp:   97.6 F (36.4 C)   TempSrc:      SpO2:  98%  98%  Weight:      Height:      PainSc:        Isolation Precautions Enteric precautions (UV disinfection)  Medications Medications  apixaban (ELIQUIS) tablet 5 mg (5 mg Oral Given 05/10/22 0932)  loperamide (IMODIUM) capsule 2 mg (has no administration in time range)  acetaminophen (TYLENOL) tablet 650 mg (has no administration in time range)  ondansetron (ZOFRAN) injection 4 mg (has no administration in time range)  furosemide (LASIX) tablet 20 mg (20 mg Oral Given 05/10/22 0932)  LORazepam (ATIVAN) tablet 0.5 mg (0.5 mg Oral Given 05/09/22 1454)  furosemide (LASIX) injection 20 mg (20 mg Intravenous Given 05/10/22 0002)  magnesium sulfate IVPB 2 g 50 mL (0 g Intravenous Stopped 05/10/22 0210)  iohexol (OMNIPAQUE) 350 MG/ML injection 55 mL (55 mLs Intravenous Contrast Given 05/10/22 0041)    Mobility walks with device     Focused Assessments Cardiac Assessment Handoff:  Cardiac Rhythm: Atrial fibrillation No results found for: "CKTOTAL", "CKMB", "CKMBINDEX", "TROPONINI" Lab Results  Component Value Date   DDIMER 1.07 (H) 05/09/2022   Does the Patient currently have chest pain? No    R Recommendations: See Admitting Provider Note  Report given to:   Additional Notes: .

## 2022-05-10 NOTE — ED Notes (Signed)
Report to Sue,RN.

## 2022-05-10 NOTE — ED Notes (Signed)
Patient transported to ECHO.

## 2022-05-10 NOTE — Progress Notes (Signed)
  CTPA report reviewed. WBC 4.4. no fevers. I do not think she has pneumonia.   Carollee Herter, DO [facility]

## 2022-05-11 NOTE — Telephone Encounter (Signed)
2nd call attempt, reviewed note from [facility] with patient who is agreeable to follow up lab work. Labs ordered and mailed to patient. She knows that she can come to the [facility] 3rd floor lab or any lab corp. Patient verbalizes understanding.

## 2022-05-13 DIAGNOSIS — M810 Age-related osteoporosis without current pathological fracture: Secondary | ICD-10-CM | POA: Diagnosis not present

## 2022-05-13 DIAGNOSIS — I10 Essential (primary) hypertension: Secondary | ICD-10-CM | POA: Diagnosis not present

## 2022-05-13 DIAGNOSIS — M109 Gout, unspecified: Secondary | ICD-10-CM | POA: Diagnosis not present

## 2022-05-13 DIAGNOSIS — H00014 Hordeolum externum left upper eyelid: Secondary | ICD-10-CM | POA: Diagnosis not present

## 2022-05-13 DIAGNOSIS — R2689 Other abnormalities of gait and mobility: Secondary | ICD-10-CM | POA: Diagnosis not present

## 2022-05-13 DIAGNOSIS — F411 Generalized anxiety disorder: Secondary | ICD-10-CM | POA: Diagnosis not present

## 2022-05-13 DIAGNOSIS — E441 Mild protein-calorie malnutrition: Secondary | ICD-10-CM | POA: Diagnosis not present

## 2022-05-13 DIAGNOSIS — R269 Unspecified abnormalities of gait and mobility: Secondary | ICD-10-CM | POA: Diagnosis not present

## 2022-05-13 DIAGNOSIS — Z0000 Encounter for general adult medical examination without abnormal findings: Secondary | ICD-10-CM | POA: Diagnosis not present

## 2022-05-13 DIAGNOSIS — Z23 Encounter for immunization: Secondary | ICD-10-CM | POA: Diagnosis not present

## 2022-05-13 DIAGNOSIS — Z932 Ileostomy status: Secondary | ICD-10-CM | POA: Diagnosis not present

## 2022-05-19 DIAGNOSIS — H00014 Hordeolum externum left upper eyelid: Secondary | ICD-10-CM | POA: Diagnosis not present

## 2022-05-22 DIAGNOSIS — Z85040 Personal history of malignant carcinoid tumor of rectum: Secondary | ICD-10-CM | POA: Diagnosis not present

## 2022-05-22 DIAGNOSIS — D509 Iron deficiency anemia, unspecified: Secondary | ICD-10-CM | POA: Diagnosis not present

## 2022-05-22 DIAGNOSIS — F411 Generalized anxiety disorder: Secondary | ICD-10-CM | POA: Diagnosis not present

## 2022-05-22 DIAGNOSIS — E213 Hyperparathyroidism, unspecified: Secondary | ICD-10-CM | POA: Diagnosis not present

## 2022-05-22 DIAGNOSIS — Z7901 Long term (current) use of anticoagulants: Secondary | ICD-10-CM | POA: Diagnosis not present

## 2022-05-22 DIAGNOSIS — M109 Gout, unspecified: Secondary | ICD-10-CM | POA: Diagnosis not present

## 2022-05-22 DIAGNOSIS — I4891 Unspecified atrial fibrillation: Secondary | ICD-10-CM | POA: Diagnosis not present

## 2022-05-22 DIAGNOSIS — K5090 Crohn's disease, unspecified, without complications: Secondary | ICD-10-CM | POA: Diagnosis not present

## 2022-05-22 DIAGNOSIS — E441 Mild protein-calorie malnutrition: Secondary | ICD-10-CM | POA: Diagnosis not present

## 2022-05-22 DIAGNOSIS — M810 Age-related osteoporosis without current pathological fracture: Secondary | ICD-10-CM | POA: Diagnosis not present

## 2022-05-22 DIAGNOSIS — H00014 Hordeolum externum left upper eyelid: Secondary | ICD-10-CM | POA: Diagnosis not present

## 2022-05-22 DIAGNOSIS — I10 Essential (primary) hypertension: Secondary | ICD-10-CM | POA: Diagnosis not present

## 2022-05-22 DIAGNOSIS — E559 Vitamin D deficiency, unspecified: Secondary | ICD-10-CM | POA: Diagnosis not present

## 2022-05-22 DIAGNOSIS — E042 Nontoxic multinodular goiter: Secondary | ICD-10-CM | POA: Diagnosis not present

## 2022-05-24 DIAGNOSIS — D649 Anemia, unspecified: Secondary | ICD-10-CM | POA: Diagnosis not present

## 2022-05-24 LAB — CBC
Hematocrit: 30.2 % — ABNORMAL LOW (ref 34.0–46.6)
Hemoglobin: 9.3 g/dL — ABNORMAL LOW (ref 11.1–15.9)
MCH: 26.6 pg (ref 26.6–33.0)
MCHC: 30.8 g/dL — ABNORMAL LOW (ref 31.5–35.7)
MCV: 86 fL (ref 79–97)
Platelets: 294 10*3/uL (ref 150–450)
RBC: 3.5 x10E6/uL — ABNORMAL LOW (ref 3.77–5.28)
RDW: 15.4 % (ref 11.7–15.4)
WBC: 6.8 10*3/uL (ref 3.4–10.8)

## 2022-05-25 ENCOUNTER — Encounter (HOSPITAL_COMMUNITY): Payer: Self-pay | Admitting: Internal Medicine

## 2022-05-25 ENCOUNTER — Ambulatory Visit (HOSPITAL_COMMUNITY)
Admit: 2022-05-25 | Discharge: 2022-05-25 | Disposition: A | Discharge status: Home / Self Care | Payer: Medicare Other | Attending: Internal Medicine | Admitting: Internal Medicine

## 2022-05-25 VITALS — BP 138/88 | HR 59 | Ht 60.0 in | Wt 127.8 lb

## 2022-05-25 DIAGNOSIS — I480 Paroxysmal atrial fibrillation: Secondary | ICD-10-CM

## 2022-05-25 DIAGNOSIS — F411 Generalized anxiety disorder: Secondary | ICD-10-CM | POA: Diagnosis not present

## 2022-05-25 DIAGNOSIS — R0683 Snoring: Secondary | ICD-10-CM | POA: Insufficient documentation

## 2022-05-25 DIAGNOSIS — I4819 Other persistent atrial fibrillation: Secondary | ICD-10-CM | POA: Insufficient documentation

## 2022-05-25 DIAGNOSIS — I10 Essential (primary) hypertension: Secondary | ICD-10-CM | POA: Insufficient documentation

## 2022-05-25 DIAGNOSIS — D6869 Other thrombophilia: Secondary | ICD-10-CM

## 2022-05-25 LAB — BASIC METABOLIC PANEL
Anion gap: 11 (ref 5–15)
BUN: 35 mg/dL — ABNORMAL HIGH (ref 8–23)
CO2: 23 mmol/L (ref 22–32)
Calcium: 9.4 mg/dL (ref 8.9–10.3)
Chloride: 97 mmol/L — ABNORMAL LOW (ref 98–111)
Creatinine, Ser: 0.81 mg/dL (ref 0.44–1.00)
GFR, Estimated: >60 mL/min (ref >60)
Glucose, Bld: 96 mg/dL (ref 70–99)
Potassium: 4.6 mmol/L (ref 3.5–5.1)
Sodium: 131 mmol/L — ABNORMAL LOW (ref 135–145)

## 2022-05-25 LAB — CBC
HCT: 30.5 % — ABNORMAL LOW (ref 36.0–46.0)
Hemoglobin: 9.7 g/dL — ABNORMAL LOW (ref 12.0–15.0)
MCH: 26.9 pg (ref 26.0–34.0)
MCHC: 31.8 g/dL (ref 30.0–36.0)
MCV: 84.7 fL (ref 80.0–100.0)
Platelets: 291 10*3/uL (ref 150–400)
RBC: 3.6 MIL/uL — ABNORMAL LOW (ref 3.87–5.11)
RDW: 15.3 % (ref 11.5–15.5)
WBC: 6 10*3/uL (ref 4.0–10.5)
nRBC: 0 % (ref 0.0–0.2)

## 2022-05-25 MED ORDER — APIXABAN 5 MG PO TABS: 5.0000 mg | ORAL_TABLET | Freq: Two times a day (BID) | ORAL | 4 refills | Status: DC

## 2022-05-25 NOTE — H&P (View-Only) (Signed)
  Primary Care Physician: Raju, Sneha P, MD Primary Cardiologist: None Primary Electrophysiologist: None Referring Physician: [facility]   Sandy Trujillo is a 89 y.o. female with a history of rectal cancer s/p colectomy with ileostomy, anxiety, GERD, aortic atherosclerosis, hyperparathyroidism, HTN, and atrial fibrillation who presents for consultation in the [facility]. The patient was initially diagnosed with atrial fibrillation upon presenting to urgent care due to Apple Watch reporting Afib over several consecutive days in the setting of increased stress/anxiety and chronic fatigue. She was sent from urgent care to ED and admitted 4/14-15/24. She was discharged still in rate controlled Afib with no nodal agent given; sent home on Eliquis 5 mg BID. Outpatient sleep study Patient is on Eliquis 5 mg BID for a CHADS2VASC score of 5.  On evaluation today, she is currently in rate controlled Afib. She feels tired sometimes but denies any chest pain. Some short of breath but not always. She began taking two full doses of Eliquis on 4/16.   She is compliant with anticoagulation and has not missed any doses. She has no bleeding concerns.  Today, she denies symptoms of palpitations, chest pain, orthopnea, PND, lower extremity edema, dizziness, presyncope, syncope, snoring, daytime somnolence, bleeding, or neurologic sequela. The patient is tolerating medications without difficulties and is otherwise without complaint today.   Atrial Fibrillation Risk Factors:  she does have symptoms or diagnosis of sleep apnea. she does not have a history of rheumatic fever. she does not have a history of alcohol use. The patient does not have a history of early familial atrial fibrillation or other arrhythmias.  she has a BMI of Body mass index is 24.96 kg/m.. Filed Weights   05/25/22 1420  Weight: 58 kg    No family history on file.   Atrial Fibrillation Management  history:  Previous antiarrhythmic drugs: None Previous cardioversions: None Previous ablations: None Anticoagulation history: Eliquis 5 mg BID   Past Medical History:  Diagnosis Date   Anemia    Anxiety    GERD (gastroesophageal reflux disease)    Hyperparathyroidism (HCC) 2019   Hypertension    Ileostomy in place (HCC)    Osteoporosis 2019   Pneumonia    hx of   rectal ca 2007   surg only   Past Surgical History:  Procedure Laterality Date   ABDOMINAL HYSTERECTOMY     BREAST EXCISIONAL BIOPSY Bilateral    benign   COLON SURGERY  2007   PARATHYROIDECTOMY N/A 05/26/2021   Procedure: NECK EXPLORATION WITH RIGHT INFERIOR PARATHYROIDECTOMY;  Surgeon: Gerkin, Todd, MD;  Location: [facility];  Service: General;  Laterality: N/A;   surgery for spin al stenosis      bone spur touching nerve per pt    Current Outpatient Medications  Medication Sig Dispense Refill   Cholecalciferol (VITAMIN D3 SUPER STRENGTH) 50 MCG (2000 UT) TABS Take 2,000 Units by mouth daily.     cyanocobalamin (VITAMIN B12) 500 MCG tablet Take 500 mcg by mouth daily.     erythromycin ophthalmic ointment Apply to eye.     ferrous sulfate 325 (65 FE) MG tablet Take by mouth.     lisinopril (ZESTRIL) 20 MG tablet Take 20 mg by mouth daily.     loperamide (IMODIUM) 2 MG capsule Take 2 mg by mouth as needed for diarrhea or loose stools.     loratadine (CLARITIN) 10 MG tablet Take 5 mg by mouth daily.     Multiple Vitamins-Minerals (CENTRUM SILVER   PO) Take 1 tablet by mouth daily.     apixaban (ELIQUIS) 5 MG TABS tablet Take 1 tablet (5 mg total) by mouth 2 (two) times daily. 60 tablet 4   No current facility-administered medications for this encounter.    Allergies  Allergen Reactions   Buspar [Buspirone] Other (See Comments)    Increased risk for Afib   Risedronate Other (See Comments)    Lowered BP   Sertraline Diarrhea    Social History   Socioeconomic History   Marital status: Widowed    Spouse name:  Not on file   Number of children: Not on file   Years of education: Not on file   Highest education level: Not on file  Occupational History   Not on file  Tobacco Use   Smoking status: Never   Smokeless tobacco: Never   Tobacco comments:    Never smoke 05/25/22  Vaping Use   Vaping Use: Never used  Substance and Sexual Activity   Alcohol use: Not Currently   Drug use: Never   Sexual activity: Not on file  Other Topics Concern   Not on file  Social History Narrative   Not on file   Social Determinants of Health   Financial Resource Strain: Not on file  Food Insecurity: No Food Insecurity (05/09/2022)   Hunger Vital Sign    Worried About Running Out of Food in the Last Year: Never true    Ran Out of Food in the Last Year: Never true  Transportation Needs: No Transportation Needs (05/09/2022)   PRAPARE - Transportation    Lack of Transportation (Medical): No    Lack of Transportation (Non-Medical): No  Physical Activity: Not on file  Stress: Not on file  Social Connections: Not on file  Intimate Partner Violence: Not At Risk (05/09/2022)   Humiliation, Afraid, Rape, and Kick questionnaire    Fear of Current or Ex-Partner: No    Emotionally Abused: No    Physically Abused: No    Sexually Abused: No     ROS- All systems are reviewed and negative except as per the HPI above.  Physical Exam: Vitals:   05/25/22 1420  BP: 138/88  Pulse: (!) 59  Weight: 58 kg  Height: 5' (1.524 m)    GEN- The patient is a well appearing female, alert and oriented x 3 today.   Head- normocephalic, atraumatic Eyes-  Sclera clear, conjunctiva pink Ears- hearing intact Oropharynx- clear Neck- supple  Lungs- Clear to ausculation bilaterally, normal work of breathing Heart- Irregular rate and rhythm, no murmurs, rubs or gallops  GI- soft, NT, ND, + BS Extremities- 1+ pitting edema bilateral ankles MS- no significant deformity or atrophy Skin- no rash or lesion Psych- euthymic mood,  full affect Neuro- strength and sensation are intact  Wt Readings from Last 3 Encounters:  05/25/22 58 kg  05/09/22 64 kg  05/26/21 64 kg    EKG today demonstrates  Vent. rate 59 BPM PR interval * ms QRS duration 88 ms QT/QTcB 412/407 ms P-R-T axes * 84 66 Atrial fibrillation with slow ventricular response Abnormal ECG When compared with ECG of 09-May-2022 23:20, No significant change was found Confirmed by Camnitz, Will ([id number]) on 05/25/2022 2:57:43 PM  Echo 05/10/22 demonstrated:  1. Left ventricular ejection fraction, by estimation, is 50 to 55%. The  left ventricle has low normal function. The left ventricle has no regional  wall motion abnormalities. Left ventricular diastolic parameters are  indeterminate.   2. Right ventricular   systolic function is mildly reduced. The right  ventricular size is mildly enlarged. There is mildly elevated pulmonary  artery systolic pressure.   3. Left atrial size was severely dilated.   4. Right atrial size was moderately dilated.   5. The mitral valve is normal in structure. Moderate mitral valve  regurgitation. No evidence of mitral stenosis.   6. Tricuspid valve regurgitation is moderate.   7. The aortic valve is tricuspid. There is mild calcification of the  aortic valve. There is mild thickening of the aortic valve. Aortic valve  regurgitation is not visualized. No aortic stenosis is present.   8. The inferior vena cava is normal in size with greater than 50%  respiratory variability, suggesting right atrial pressure of 3 mmHg.   Epic records are reviewed at length today.  CHA2DS2-VASc Score = 5  The patient's score is based upon: CHF History: 0 HTN History: 1 Diabetes History: 0 Stroke History: 0 Vascular Disease History: 1 Age Score: 2 Gender Score: 1       ASSESSMENT AND PLAN: Persistent Atrial Fibrillation (ICD10:  I48.19) The patient's CHA2DS2-VASc score is 5, indicating a 7.2% annual risk of stroke.    Education  provided about Afib with visual diagram. Discussion about medication treatment options and noted that ablation not indicated due to age. She is currently in Afib and after discussion with patient and daughter have agree to proceed with scheduling DCCV.  Tikosyn and amiodarone were briefly discussed as options going forward for medication therapy. CrCl 46 mL/min would make her eligible for Tikosyn 250 mcg BID.  2. Secondary Hypercoagulable State (ICD10:  D68.69) The patient is at significant risk for stroke/thromboembolism based upon her CHA2DS2-VASc Score of 5.  Continue Apixaban (Eliquis).  Started on 4/16  3. Snoring with concern for Obstructive sleep apnea Sleep study ordered  4. HTN Stable today, no changes.   Follow up 1-2 weeks after DCCV.    Marcellas Marchant, PA-C [facility] [address] [place], [place] [address] [phone] 05/25/2022 2:58 PM  

## 2022-05-25 NOTE — Progress Notes (Signed)
Primary Care Physician: Thana Ates, MD Primary Cardiologist: None Primary Electrophysiologist: None Referring Physician: [facility]   Sandy Trujillo is a 89 y.o. female with a history of rectal cancer s/p colectomy with ileostomy, anxiety, GERD, aortic atherosclerosis, hyperparathyroidism, HTN, and atrial fibrillation who presents for consultation in the [facility]. The patient was initially diagnosed with atrial fibrillation upon presenting to urgent care due to Apple Watch reporting Afib over several consecutive days in the setting of increased stress/anxiety and chronic fatigue. She was sent from urgent care to ED and admitted 4/14-15/24. She was discharged still in rate controlled Afib with no nodal agent given; sent home on Eliquis 5 mg BID. Outpatient sleep study Patient is on Eliquis 5 mg BID for a CHADS2VASC score of 5.  On evaluation today, she is currently in rate controlled Afib. She feels tired sometimes but denies any chest pain. Some short of breath but not always. She began taking two full doses of Eliquis on 4/16.   She is compliant with anticoagulation and has not missed any doses. She has no bleeding concerns.  Today, she denies symptoms of palpitations, chest pain, orthopnea, PND, lower extremity edema, dizziness, presyncope, syncope, snoring, daytime somnolence, bleeding, or neurologic sequela. The patient is tolerating medications without difficulties and is otherwise without complaint today.   Atrial Fibrillation Risk Factors:  she does have symptoms or diagnosis of sleep apnea. she does not have a history of rheumatic fever. she does not have a history of alcohol use. The patient does not have a history of early familial atrial fibrillation or other arrhythmias.  she has a BMI of Body mass index is 24.96 kg/m.Marland Kitchen Filed Weights   05/25/22 1420  Weight: 58 kg    No family history on file.   Atrial Fibrillation Management  history:  Previous antiarrhythmic drugs: None Previous cardioversions: None Previous ablations: None Anticoagulation history: Eliquis 5 mg BID   Past Medical History:  Diagnosis Date   Anemia    Anxiety    GERD (gastroesophageal reflux disease)    Hyperparathyroidism (HCC) 2019   Hypertension    Ileostomy in place [facility])    Osteoporosis 2019   Pneumonia    hx of   rectal ca 2007   surg only   Past Surgical History:  Procedure Laterality Date   ABDOMINAL HYSTERECTOMY     BREAST EXCISIONAL BIOPSY Bilateral    benign   COLON SURGERY  2007   PARATHYROIDECTOMY N/A 05/26/2021   Procedure: NECK EXPLORATION WITH RIGHT INFERIOR PARATHYROIDECTOMY;  Surgeon: Darnell Level, MD;  Location: [facility];  Service: General;  Laterality: N/A;   surgery for spin al stenosis      bone spur touching nerve per pt    Current Outpatient Medications  Medication Sig Dispense Refill   Cholecalciferol (VITAMIN D3 SUPER STRENGTH) 50 MCG (2000 UT) TABS Take 2,000 Units by mouth daily.     cyanocobalamin (VITAMIN B12) 500 MCG tablet Take 500 mcg by mouth daily.     erythromycin ophthalmic ointment Apply to eye.     ferrous sulfate 325 (65 FE) MG tablet Take by mouth.     lisinopril (ZESTRIL) 20 MG tablet Take 20 mg by mouth daily.     loperamide (IMODIUM) 2 MG capsule Take 2 mg by mouth as needed for diarrhea or loose stools.     loratadine (CLARITIN) 10 MG tablet Take 5 mg by mouth daily.     Multiple Vitamins-Minerals (CENTRUM SILVER  PO) Take 1 tablet by mouth daily.     apixaban (ELIQUIS) 5 MG TABS tablet Take 1 tablet (5 mg total) by mouth 2 (two) times daily. 60 tablet 4   No current facility-administered medications for this encounter.    Allergies  Allergen Reactions   Buspar [Buspirone] Other (See Comments)    Increased risk for Afib   Risedronate Other (See Comments)    Lowered BP   Sertraline Diarrhea    Social History   Socioeconomic History   Marital status: Widowed    Spouse name:  Not on file   Number of children: Not on file   Years of education: Not on file   Highest education level: Not on file  Occupational History   Not on file  Tobacco Use   Smoking status: Never   Smokeless tobacco: Never   Tobacco comments:    Never smoke 05/25/22  Vaping Use   Vaping Use: Never used  Substance and Sexual Activity   Alcohol use: Not Currently   Drug use: Never   Sexual activity: Not on file  Other Topics Concern   Not on file  Social History Narrative   Not on file   Social Determinants of Health   Financial Resource Strain: Not on file  Food Insecurity: No Food Insecurity (05/09/2022)   Hunger Vital Sign    Worried About Running Out of Food in the Last Year: Never true    Ran Out of Food in the Last Year: Never true  Transportation Needs: No Transportation Needs (05/09/2022)   PRAPARE - Administrator, Civil Service (Medical): No    Lack of Transportation (Non-Medical): No  Physical Activity: Not on file  Stress: Not on file  Social Connections: Not on file  Intimate Partner Violence: Not At Risk (05/09/2022)   Humiliation, Afraid, Rape, and Kick questionnaire    Fear of Current or Ex-Partner: No    Emotionally Abused: No    Physically Abused: No    Sexually Abused: No     ROS- All systems are reviewed and negative except as per the HPI above.  Physical Exam: Vitals:   05/25/22 1420  BP: 138/88  Pulse: (!) 59  Weight: 58 kg  Height: 5' (1.524 m)    GEN- The patient is a well appearing female, alert and oriented x 3 today.   Head- normocephalic, atraumatic Eyes-  Sclera clear, conjunctiva pink Ears- hearing intact Oropharynx- clear Neck- supple  Lungs- Clear to ausculation bilaterally, normal work of breathing Heart- Irregular rate and rhythm, no murmurs, rubs or gallops  GI- soft, NT, ND, + BS Extremities- 1+ pitting edema bilateral ankles MS- no significant deformity or atrophy Skin- no rash or lesion Psych- euthymic mood,  full affect Neuro- strength and sensation are intact  Wt Readings from Last 3 Encounters:  05/25/22 58 kg  05/09/22 64 kg  05/26/21 64 kg    EKG today demonstrates  Vent. rate 59 BPM PR interval * ms QRS duration 88 ms QT/QTcB 412/407 ms P-R-T axes * 84 66 Atrial fibrillation with slow ventricular response Abnormal ECG When compared with ECG of 09-May-2022 23:20, No significant change was found Confirmed by Camnitz, Will ([id number]) on 05/25/2022 2:57:43 PM  Echo 05/10/22 demonstrated:  1. Left ventricular ejection fraction, by estimation, is 50 to 55%. The  left ventricle has low normal function. The left ventricle has no regional  wall motion abnormalities. Left ventricular diastolic parameters are  indeterminate.   2. Right ventricular  systolic function is mildly reduced. The right  ventricular size is mildly enlarged. There is mildly elevated pulmonary  artery systolic pressure.   3. Left atrial size was severely dilated.   4. Right atrial size was moderately dilated.   5. The mitral valve is normal in structure. Moderate mitral valve  regurgitation. No evidence of mitral stenosis.   6. Tricuspid valve regurgitation is moderate.   7. The aortic valve is tricuspid. There is mild calcification of the  aortic valve. There is mild thickening of the aortic valve. Aortic valve  regurgitation is not visualized. No aortic stenosis is present.   8. The inferior vena cava is normal in size with greater than 50%  respiratory variability, suggesting right atrial pressure of 3 mmHg.   Epic records are reviewed at length today.  CHA2DS2-VASc Score = 5  The patient's score is based upon: CHF History: 0 HTN History: 1 Diabetes History: 0 Stroke History: 0 Vascular Disease History: 1 Age Score: 2 Gender Score: 1       ASSESSMENT AND PLAN: Persistent Atrial Fibrillation (ICD10:  I48.19) The patient's CHA2DS2-VASc score is 5, indicating a 7.2% annual risk of stroke.    Education  provided about Afib with visual diagram. Discussion about medication treatment options and noted that ablation not indicated due to age. She is currently in Afib and after discussion with patient and daughter have agree to proceed with scheduling DCCV.  Tikosyn and amiodarone were briefly discussed as options going forward for medication therapy. CrCl 46 mL/min would make her eligible for Tikosyn 250 mcg BID.  2. Secondary Hypercoagulable State (ICD10:  D68.69) The patient is at significant risk for stroke/thromboembolism based upon her CHA2DS2-VASc Score of 5.  Continue Apixaban (Eliquis).  Started on 4/16  3. Snoring with concern for Obstructive sleep apnea Sleep study ordered  4. HTN Stable today, no changes.   Follow up 1-2 weeks after DCCV.    Lake Bells, PA-C Afib Clinic [facility] [address] [place], [place] [address] [phone] 05/25/2022 2:58 PM

## 2022-05-25 NOTE — Patient Instructions (Signed)
Cardioversion scheduled for: Wednesday, May 8th   - Arrive at the [facility] and go to admitting at 815AM   - Do not eat or drink anything after midnight the night prior to your procedure.   - Take all your morning medication (except diabetic medications) with a sip of water prior to arrival.  - You will not be able to drive home after your procedure.    - Do NOT miss any doses of your blood thinner - if you should miss a dose please notify our office immediately.   - If you feel as if you go back into normal rhythm prior to scheduled cardioversion, please notify our office immediately.   If your procedure is canceled in the cardioversion suite you will be charged a cancellation fee.

## 2022-05-26 ENCOUNTER — Other Ambulatory Visit (HOSPITAL_COMMUNITY): Payer: Self-pay | Admitting: *Deleted

## 2022-05-27 DIAGNOSIS — I10 Essential (primary) hypertension: Secondary | ICD-10-CM | POA: Diagnosis not present

## 2022-05-27 DIAGNOSIS — I4891 Unspecified atrial fibrillation: Secondary | ICD-10-CM | POA: Diagnosis not present

## 2022-05-27 DIAGNOSIS — K5090 Crohn's disease, unspecified, without complications: Secondary | ICD-10-CM | POA: Diagnosis not present

## 2022-05-27 DIAGNOSIS — E441 Mild protein-calorie malnutrition: Secondary | ICD-10-CM | POA: Diagnosis not present

## 2022-05-27 DIAGNOSIS — E213 Hyperparathyroidism, unspecified: Secondary | ICD-10-CM | POA: Diagnosis not present

## 2022-05-27 DIAGNOSIS — E559 Vitamin D deficiency, unspecified: Secondary | ICD-10-CM | POA: Diagnosis not present

## 2022-05-28 DIAGNOSIS — E213 Hyperparathyroidism, unspecified: Secondary | ICD-10-CM | POA: Diagnosis not present

## 2022-05-28 DIAGNOSIS — E559 Vitamin D deficiency, unspecified: Secondary | ICD-10-CM | POA: Diagnosis not present

## 2022-05-28 DIAGNOSIS — I10 Essential (primary) hypertension: Secondary | ICD-10-CM | POA: Diagnosis not present

## 2022-05-28 DIAGNOSIS — I4891 Unspecified atrial fibrillation: Secondary | ICD-10-CM | POA: Diagnosis not present

## 2022-05-28 DIAGNOSIS — K5090 Crohn's disease, unspecified, without complications: Secondary | ICD-10-CM | POA: Diagnosis not present

## 2022-05-28 DIAGNOSIS — E441 Mild protein-calorie malnutrition: Secondary | ICD-10-CM | POA: Diagnosis not present

## 2022-05-31 DIAGNOSIS — E441 Mild protein-calorie malnutrition: Secondary | ICD-10-CM | POA: Diagnosis not present

## 2022-05-31 DIAGNOSIS — I4891 Unspecified atrial fibrillation: Secondary | ICD-10-CM | POA: Diagnosis not present

## 2022-05-31 DIAGNOSIS — K5090 Crohn's disease, unspecified, without complications: Secondary | ICD-10-CM | POA: Diagnosis not present

## 2022-05-31 DIAGNOSIS — E213 Hyperparathyroidism, unspecified: Secondary | ICD-10-CM | POA: Diagnosis not present

## 2022-05-31 DIAGNOSIS — I10 Essential (primary) hypertension: Secondary | ICD-10-CM | POA: Diagnosis not present

## 2022-05-31 DIAGNOSIS — E559 Vitamin D deficiency, unspecified: Secondary | ICD-10-CM | POA: Diagnosis not present

## 2022-06-04 NOTE — Progress Notes (Signed)
Spoke to pt and instructed to come at 0930 on 06/07/22.  Instructed pt to be NPO after 0000 on 06/07/22.  Instructed pt to have a ride home and someone to stay with her for 24 hours post op.  Stated to take eliquis on day of procedure with a small sip of water.

## 2022-06-07 ENCOUNTER — Ambulatory Visit (HOSPITAL_BASED_OUTPATIENT_CLINIC_OR_DEPARTMENT_OTHER)
Admission: RE | Admit: 2022-06-07 | Discharge: 2022-06-07 | Disposition: A | Discharge status: Home / Self Care | Payer: Medicare Other | Source: Home / Self Care | Attending: Cardiovascular Disease | Admitting: Cardiovascular Disease

## 2022-06-07 ENCOUNTER — Other Ambulatory Visit: Payer: Self-pay

## 2022-06-07 ENCOUNTER — Ambulatory Visit (HOSPITAL_BASED_OUTPATIENT_CLINIC_OR_DEPARTMENT_OTHER): Payer: Medicare Other | Admitting: Certified Registered Nurse Anesthetist

## 2022-06-07 ENCOUNTER — Encounter (HOSPITAL_COMMUNITY): Payer: Self-pay | Admitting: Cardiovascular Disease

## 2022-06-07 ENCOUNTER — Ambulatory Visit (HOSPITAL_COMMUNITY): Payer: Medicare Other | Admitting: Certified Registered Nurse Anesthetist

## 2022-06-07 ENCOUNTER — Encounter (HOSPITAL_COMMUNITY)
Admission: RE | Disposition: A | Discharge status: Home / Self Care | Payer: Self-pay | Source: Home / Self Care | Attending: Cardiovascular Disease

## 2022-06-07 DIAGNOSIS — I4719 Other supraventricular tachycardia: Secondary | ICD-10-CM | POA: Diagnosis not present

## 2022-06-07 DIAGNOSIS — E871 Hypo-osmolality and hyponatremia: Secondary | ICD-10-CM | POA: Diagnosis not present

## 2022-06-07 DIAGNOSIS — I480 Paroxysmal atrial fibrillation: Secondary | ICD-10-CM | POA: Diagnosis not present

## 2022-06-07 DIAGNOSIS — Z7901 Long term (current) use of anticoagulants: Secondary | ICD-10-CM | POA: Insufficient documentation

## 2022-06-07 DIAGNOSIS — E213 Hyperparathyroidism, unspecified: Secondary | ICD-10-CM | POA: Diagnosis present

## 2022-06-07 DIAGNOSIS — I4891 Unspecified atrial fibrillation: Secondary | ICD-10-CM | POA: Diagnosis not present

## 2022-06-07 DIAGNOSIS — D6869 Other thrombophilia: Secondary | ICD-10-CM | POA: Diagnosis not present

## 2022-06-07 DIAGNOSIS — K31811 Angiodysplasia of stomach and duodenum with bleeding: Secondary | ICD-10-CM | POA: Diagnosis not present

## 2022-06-07 DIAGNOSIS — K921 Melena: Secondary | ICD-10-CM | POA: Diagnosis not present

## 2022-06-07 DIAGNOSIS — Z8719 Personal history of other diseases of the digestive system: Secondary | ICD-10-CM | POA: Diagnosis not present

## 2022-06-07 DIAGNOSIS — E892 Postprocedural hypoparathyroidism: Secondary | ICD-10-CM | POA: Diagnosis present

## 2022-06-07 DIAGNOSIS — I10 Essential (primary) hypertension: Secondary | ICD-10-CM | POA: Insufficient documentation

## 2022-06-07 DIAGNOSIS — Z9049 Acquired absence of other specified parts of digestive tract: Secondary | ICD-10-CM | POA: Insufficient documentation

## 2022-06-07 DIAGNOSIS — I498 Other specified cardiac arrhythmias: Secondary | ICD-10-CM | POA: Diagnosis not present

## 2022-06-07 DIAGNOSIS — I4819 Other persistent atrial fibrillation: Secondary | ICD-10-CM

## 2022-06-07 DIAGNOSIS — I442 Atrioventricular block, complete: Secondary | ICD-10-CM | POA: Diagnosis not present

## 2022-06-07 DIAGNOSIS — K5190 Ulcerative colitis, unspecified, without complications: Secondary | ICD-10-CM | POA: Diagnosis not present

## 2022-06-07 DIAGNOSIS — F419 Anxiety disorder, unspecified: Secondary | ICD-10-CM | POA: Diagnosis not present

## 2022-06-07 DIAGNOSIS — I2510 Atherosclerotic heart disease of native coronary artery without angina pectoris: Secondary | ICD-10-CM | POA: Diagnosis not present

## 2022-06-07 DIAGNOSIS — R0683 Snoring: Secondary | ICD-10-CM | POA: Insufficient documentation

## 2022-06-07 DIAGNOSIS — K219 Gastro-esophageal reflux disease without esophagitis: Secondary | ICD-10-CM | POA: Diagnosis not present

## 2022-06-07 DIAGNOSIS — D649 Anemia, unspecified: Secondary | ICD-10-CM | POA: Diagnosis not present

## 2022-06-07 DIAGNOSIS — Z85048 Personal history of other malignant neoplasm of rectum, rectosigmoid junction, and anus: Secondary | ICD-10-CM | POA: Diagnosis not present

## 2022-06-07 DIAGNOSIS — Z08 Encounter for follow-up examination after completed treatment for malignant neoplasm: Secondary | ICD-10-CM | POA: Insufficient documentation

## 2022-06-07 DIAGNOSIS — R55 Syncope and collapse: Secondary | ICD-10-CM | POA: Diagnosis not present

## 2022-06-07 DIAGNOSIS — I495 Sick sinus syndrome: Secondary | ICD-10-CM | POA: Diagnosis not present

## 2022-06-07 DIAGNOSIS — K222 Esophageal obstruction: Secondary | ICD-10-CM | POA: Diagnosis present

## 2022-06-07 DIAGNOSIS — J309 Allergic rhinitis, unspecified: Secondary | ICD-10-CM | POA: Diagnosis not present

## 2022-06-07 DIAGNOSIS — I700 Atherosclerosis of aorta: Secondary | ICD-10-CM | POA: Diagnosis not present

## 2022-06-07 DIAGNOSIS — M810 Age-related osteoporosis without current pathological fracture: Secondary | ICD-10-CM | POA: Diagnosis present

## 2022-06-07 DIAGNOSIS — Z79899 Other long term (current) drug therapy: Secondary | ICD-10-CM | POA: Diagnosis not present

## 2022-06-07 DIAGNOSIS — Z8639 Personal history of other endocrine, nutritional and metabolic disease: Secondary | ICD-10-CM | POA: Diagnosis not present

## 2022-06-07 DIAGNOSIS — K51919 Ulcerative colitis, unspecified with unspecified complications: Secondary | ICD-10-CM | POA: Diagnosis not present

## 2022-06-07 DIAGNOSIS — I459 Conduction disorder, unspecified: Secondary | ICD-10-CM | POA: Diagnosis present

## 2022-06-07 DIAGNOSIS — F411 Generalized anxiety disorder: Secondary | ICD-10-CM | POA: Diagnosis not present

## 2022-06-07 DIAGNOSIS — I441 Atrioventricular block, second degree: Secondary | ICD-10-CM | POA: Diagnosis not present

## 2022-06-07 DIAGNOSIS — J90 Pleural effusion, not elsewhere classified: Secondary | ICD-10-CM | POA: Diagnosis not present

## 2022-06-07 DIAGNOSIS — Z932 Ileostomy status: Secondary | ICD-10-CM | POA: Diagnosis not present

## 2022-06-07 DIAGNOSIS — R001 Bradycardia, unspecified: Secondary | ICD-10-CM | POA: Diagnosis not present

## 2022-06-07 DIAGNOSIS — D62 Acute posthemorrhagic anemia: Secondary | ICD-10-CM | POA: Diagnosis not present

## 2022-06-07 DIAGNOSIS — R911 Solitary pulmonary nodule: Secondary | ICD-10-CM | POA: Diagnosis not present

## 2022-06-07 HISTORY — PX: CARDIOVERSION: SHX1299

## 2022-06-07 SURGERY — CARDIOVERSION
Anesthesia: General

## 2022-06-07 MED ORDER — PROPOFOL 10 MG/ML IV BOLUS
INTRAVENOUS | Status: DC | PRN
  Administered 2022-06-07: 30 mg via INTRAVENOUS

## 2022-06-07 MED ORDER — SODIUM CHLORIDE 0.9 % IV SOLN: INTRAVENOUS | Status: DC

## 2022-06-07 SURGICAL SUPPLY — 1 items: ELECT DEFIB PAD ADLT CADENCE (PAD) ×1 IMPLANT

## 2022-06-07 NOTE — CV Procedure (Signed)
   DIRECT CURRENT CARDIOVERSION  NAME:  Sandy Trujillo    MRN: [id number] DOB:  16-Jan-1933    ADMIT DATE: 06/07/2022  Indication:  Symptomatic atrial fibrillation  Procedure Note:  The patient signed informed consent.  They have had had therapeutic anticoagulation with eliquis greater than 3 weeks.  Anesthesia was administered by Dr. Maple Hudson.  Adequate airway was maintained throughout and vital followed per protocol.  They were cardioverted x 1 with 200J of biphasic synchronized energy.  They converted to NSR.  There were no apparent complications.  The patient had normal neuro status and respiratory status post procedure with vitals stable as recorded elsewhere.    Rhythm after DCCV was sinus bradycardia with intermittent Wenckebach and runs of atrial tachycardia.  Follow up: They will continue on current medical therapy and follow up with cardiology as scheduled.  Gerri Spore T. Flora Lipps, MD, [facility]  [facility]  [address] [place], [place] [address] [phone]  10:13 AM

## 2022-06-07 NOTE — Transfer of Care (Signed)
Immediate Anesthesia Transfer of Care Note  Patient: Sandy Trujillo  Procedure(s) Performed: CARDIOVERSION  Patient Location: Cath Lab  Anesthesia Type:MAC  Level of Consciousness: drowsy  Airway & Oxygen Therapy: Patient Spontanous Breathing  Post-op Assessment: Report given to RN and Post -op Vital signs reviewed and stable  Post vital signs: Reviewed and stable  Last Vitals:  Vitals Value Taken Time  BP 125/85  96 1013    06/07/22  Temp    Pulse 57 1013    06/07/22  Resp 12 1013    06/07/22  SpO2 96 1013    06/07/22    Last Pain:  Vitals:   06/07/22 0918  TempSrc:   PainSc: 0-No pain         Complications: No notable events documented.

## 2022-06-07 NOTE — Interval H&P Note (Signed)
History and Physical Interval Note:  06/07/2022 9:49 AM  Sandy Trujillo  has presented today for surgery, with the diagnosis of afib.  The various methods of treatment have been discussed with the patient and family. After consideration of risks, benefits and other options for treatment, the patient has consented to  Procedure(s): CARDIOVERSION (N/A) as a surgical intervention.  The patient's history has been reviewed, patient examined, no change in status, stable for surgery.  I have reviewed the patient's chart and labs.  Questions were answered to the patient's satisfaction.    NPO for DCCV. On eliquis > 3 weeks. No missed doses.   Gerri Spore T. Flora Lipps, MD, [facility]  [facility]  [address], [address] [place], [place] [address] [phone]  9:49 AM

## 2022-06-08 ENCOUNTER — Encounter (HOSPITAL_COMMUNITY): Payer: Self-pay | Admitting: Cardiovascular Disease

## 2022-06-09 ENCOUNTER — Ambulatory Visit (HOSPITAL_BASED_OUTPATIENT_CLINIC_OR_DEPARTMENT_OTHER)
Admission: RE | Admit: 2022-06-09 | Discharge: 2022-06-09 | Disposition: A | Discharge status: Home / Self Care | Payer: Medicare Other | Source: Ambulatory Visit | Attending: Internal Medicine | Admitting: Internal Medicine

## 2022-06-09 ENCOUNTER — Other Ambulatory Visit: Payer: Self-pay

## 2022-06-09 ENCOUNTER — Emergency Department (HOSPITAL_COMMUNITY): Payer: Medicare Other

## 2022-06-09 ENCOUNTER — Inpatient Hospital Stay (HOSPITAL_COMMUNITY)
Admission: EM | Admit: 2022-06-09 | Discharge: 2022-06-13 | DRG: 378 | Disposition: A | Discharge status: Home Health | Payer: Medicare Other | Attending: Internal Medicine | Admitting: Internal Medicine

## 2022-06-09 VITALS — BP 164/70 | HR 43 | Ht 60.0 in | Wt 132.0 lb

## 2022-06-09 DIAGNOSIS — R0683 Snoring: Secondary | ICD-10-CM | POA: Diagnosis present

## 2022-06-09 DIAGNOSIS — D6869 Other thrombophilia: Secondary | ICD-10-CM

## 2022-06-09 DIAGNOSIS — K921 Melena: Secondary | ICD-10-CM

## 2022-06-09 DIAGNOSIS — I2510 Atherosclerotic heart disease of native coronary artery without angina pectoris: Secondary | ICD-10-CM | POA: Diagnosis present

## 2022-06-09 DIAGNOSIS — Z8639 Personal history of other endocrine, nutritional and metabolic disease: Secondary | ICD-10-CM | POA: Diagnosis not present

## 2022-06-09 DIAGNOSIS — K51919 Ulcerative colitis, unspecified with unspecified complications: Secondary | ICD-10-CM | POA: Diagnosis not present

## 2022-06-09 DIAGNOSIS — F411 Generalized anxiety disorder: Secondary | ICD-10-CM | POA: Diagnosis not present

## 2022-06-09 DIAGNOSIS — D62 Acute posthemorrhagic anemia: Secondary | ICD-10-CM | POA: Diagnosis present

## 2022-06-09 DIAGNOSIS — E892 Postprocedural hypoparathyroidism: Secondary | ICD-10-CM | POA: Diagnosis present

## 2022-06-09 DIAGNOSIS — I4819 Other persistent atrial fibrillation: Secondary | ICD-10-CM | POA: Diagnosis present

## 2022-06-09 DIAGNOSIS — E871 Hypo-osmolality and hyponatremia: Secondary | ICD-10-CM | POA: Diagnosis present

## 2022-06-09 DIAGNOSIS — Z85048 Personal history of other malignant neoplasm of rectum, rectosigmoid junction, and anus: Secondary | ICD-10-CM | POA: Diagnosis not present

## 2022-06-09 DIAGNOSIS — K31811 Angiodysplasia of stomach and duodenum with bleeding: Principal | ICD-10-CM | POA: Diagnosis present

## 2022-06-09 DIAGNOSIS — K222 Esophageal obstruction: Secondary | ICD-10-CM | POA: Diagnosis present

## 2022-06-09 DIAGNOSIS — I10 Essential (primary) hypertension: Secondary | ICD-10-CM | POA: Insufficient documentation

## 2022-06-09 DIAGNOSIS — D649 Anemia, unspecified: Secondary | ICD-10-CM | POA: Diagnosis present

## 2022-06-09 DIAGNOSIS — K219 Gastro-esophageal reflux disease without esophagitis: Secondary | ICD-10-CM | POA: Diagnosis present

## 2022-06-09 DIAGNOSIS — I480 Paroxysmal atrial fibrillation: Secondary | ICD-10-CM | POA: Diagnosis present

## 2022-06-09 DIAGNOSIS — I459 Conduction disorder, unspecified: Principal | ICD-10-CM

## 2022-06-09 DIAGNOSIS — R911 Solitary pulmonary nodule: Secondary | ICD-10-CM | POA: Diagnosis not present

## 2022-06-09 DIAGNOSIS — Z90710 Acquired absence of both cervix and uterus: Secondary | ICD-10-CM

## 2022-06-09 DIAGNOSIS — Z932 Ileostomy status: Secondary | ICD-10-CM

## 2022-06-09 DIAGNOSIS — Z7901 Long term (current) use of anticoagulants: Secondary | ICD-10-CM

## 2022-06-09 DIAGNOSIS — I4891 Unspecified atrial fibrillation: Secondary | ICD-10-CM | POA: Diagnosis not present

## 2022-06-09 DIAGNOSIS — K5521 Angiodysplasia of colon with hemorrhage: Secondary | ICD-10-CM

## 2022-06-09 DIAGNOSIS — Z79899 Other long term (current) drug therapy: Secondary | ICD-10-CM

## 2022-06-09 DIAGNOSIS — I495 Sick sinus syndrome: Secondary | ICD-10-CM | POA: Diagnosis present

## 2022-06-09 DIAGNOSIS — R001 Bradycardia, unspecified: Secondary | ICD-10-CM | POA: Diagnosis not present

## 2022-06-09 DIAGNOSIS — F419 Anxiety disorder, unspecified: Secondary | ICD-10-CM | POA: Diagnosis present

## 2022-06-09 DIAGNOSIS — K5190 Ulcerative colitis, unspecified, without complications: Secondary | ICD-10-CM | POA: Diagnosis present

## 2022-06-09 DIAGNOSIS — I700 Atherosclerosis of aorta: Secondary | ICD-10-CM | POA: Diagnosis present

## 2022-06-09 DIAGNOSIS — Z9049 Acquired absence of other specified parts of digestive tract: Secondary | ICD-10-CM

## 2022-06-09 DIAGNOSIS — J309 Allergic rhinitis, unspecified: Secondary | ICD-10-CM | POA: Diagnosis present

## 2022-06-09 DIAGNOSIS — R55 Syncope and collapse: Secondary | ICD-10-CM

## 2022-06-09 DIAGNOSIS — I441 Atrioventricular block, second degree: Secondary | ICD-10-CM | POA: Diagnosis present

## 2022-06-09 DIAGNOSIS — J90 Pleural effusion, not elsewhere classified: Secondary | ICD-10-CM | POA: Diagnosis not present

## 2022-06-09 DIAGNOSIS — I442 Atrioventricular block, complete: Secondary | ICD-10-CM | POA: Diagnosis not present

## 2022-06-09 DIAGNOSIS — M810 Age-related osteoporosis without current pathological fracture: Secondary | ICD-10-CM | POA: Diagnosis present

## 2022-06-09 DIAGNOSIS — I498 Other specified cardiac arrhythmias: Secondary | ICD-10-CM | POA: Diagnosis not present

## 2022-06-09 DIAGNOSIS — E213 Hyperparathyroidism, unspecified: Secondary | ICD-10-CM | POA: Insufficient documentation

## 2022-06-09 DIAGNOSIS — Z888 Allergy status to other drugs, medicaments and biological substances status: Secondary | ICD-10-CM

## 2022-06-09 DIAGNOSIS — I4719 Other supraventricular tachycardia: Secondary | ICD-10-CM | POA: Diagnosis not present

## 2022-06-09 DIAGNOSIS — Z8719 Personal history of other diseases of the digestive system: Secondary | ICD-10-CM | POA: Diagnosis not present

## 2022-06-09 DIAGNOSIS — K922 Gastrointestinal hemorrhage, unspecified: Secondary | ICD-10-CM | POA: Diagnosis present

## 2022-06-09 HISTORY — DX: Bell's palsy: G51.0

## 2022-06-09 LAB — IRON AND TIBC
Iron: 57 ug/dL (ref 28–170)
Saturation Ratios: 12 % (ref 10.4–31.8)
TIBC: 459 ug/dL — ABNORMAL HIGH (ref 250–450)
UIBC: 402 ug/dL

## 2022-06-09 LAB — COMPREHENSIVE METABOLIC PANEL
ALT: 41 U/L (ref 0–44)
AST: 41 U/L (ref 15–41)
Albumin: 3.6 g/dL (ref 3.5–5.0)
Alkaline Phosphatase: 82 U/L (ref 38–126)
Anion gap: 10 (ref 5–15)
BUN: 44 mg/dL — ABNORMAL HIGH (ref 8–23)
CO2: 23 mmol/L (ref 22–32)
Calcium: 9.6 mg/dL (ref 8.9–10.3)
Chloride: 95 mmol/L — ABNORMAL LOW (ref 98–111)
Creatinine, Ser: 0.83 mg/dL (ref 0.44–1.00)
GFR, Estimated: >60 mL/min (ref >60)
Glucose, Bld: 103 mg/dL — ABNORMAL HIGH (ref 70–99)
Potassium: 4.6 mmol/L (ref 3.5–5.1)
Sodium: 128 mmol/L — ABNORMAL LOW (ref 135–145)
Total Bilirubin: 0.4 mg/dL (ref 0.3–1.2)
Total Protein: 7 g/dL (ref 6.5–8.1)

## 2022-06-09 LAB — CBC WITH DIFFERENTIAL/PLATELET
Abs Immature Granulocytes: 0.03 10*3/uL (ref 0.00–0.07)
Basophils Absolute: 0 10*3/uL (ref 0.0–0.1)
Basophils Relative: 1 %
Eosinophils Absolute: 0.2 10*3/uL (ref 0.0–0.5)
Eosinophils Relative: 3 %
HCT: 25.4 % — ABNORMAL LOW (ref 36.0–46.0)
Hemoglobin: 7.9 g/dL — ABNORMAL LOW (ref 12.0–15.0)
Immature Granulocytes: 1 %
Lymphocytes Relative: 19 %
Lymphs Abs: 1.1 10*3/uL (ref 0.7–4.0)
MCH: 27.6 pg (ref 26.0–34.0)
MCHC: 31.1 g/dL (ref 30.0–36.0)
MCV: 88.8 fL (ref 80.0–100.0)
Monocytes Absolute: 0.4 10*3/uL (ref 0.1–1.0)
Monocytes Relative: 8 %
Neutro Abs: 4 10*3/uL (ref 1.7–7.7)
Neutrophils Relative %: 68 %
Platelets: 211 10*3/uL (ref 150–400)
RBC: 2.86 MIL/uL — ABNORMAL LOW (ref 3.87–5.11)
RDW: 16.5 % — ABNORMAL HIGH (ref 11.5–15.5)
WBC: 5.7 10*3/uL (ref 4.0–10.5)
nRBC: 0 % (ref 0.0–0.2)

## 2022-06-09 LAB — VITAMIN B12: Vitamin B-12: 1117 pg/mL — ABNORMAL HIGH (ref 180–914)

## 2022-06-09 LAB — I-STAT CHEM 8, ED
BUN: 55 mg/dL — ABNORMAL HIGH (ref 8–23)
Calcium, Ion: 1.08 mmol/L — ABNORMAL LOW (ref 1.15–1.40)
Chloride: 99 mmol/L (ref 98–111)
Creatinine, Ser: 0.9 mg/dL (ref 0.44–1.00)
Glucose, Bld: 105 mg/dL — ABNORMAL HIGH (ref 70–99)
HCT: 25 % — ABNORMAL LOW (ref 36.0–46.0)
Hemoglobin: 8.5 g/dL — ABNORMAL LOW (ref 12.0–15.0)
Potassium: 5 mmol/L (ref 3.5–5.1)
Sodium: 126 mmol/L — ABNORMAL LOW (ref 135–145)
TCO2: 24 mmol/L (ref 22–32)

## 2022-06-09 LAB — PROTIME-INR
INR: 1.3 — ABNORMAL HIGH (ref 0.8–1.2)
Prothrombin Time: 16.1 seconds — ABNORMAL HIGH (ref 11.4–15.2)

## 2022-06-09 LAB — MAGNESIUM: Magnesium: 2.2 mg/dL (ref 1.7–2.4)

## 2022-06-09 LAB — CBC
HCT: 20.6 % — ABNORMAL LOW (ref 36.0–46.0)
Hemoglobin: 6.6 g/dL — CL (ref 12.0–15.0)
MCH: 27.4 pg (ref 26.0–34.0)
MCHC: 32 g/dL (ref 30.0–36.0)
MCV: 85.5 fL (ref 80.0–100.0)
Platelets: 189 10*3/uL (ref 150–400)
RBC: 2.41 MIL/uL — ABNORMAL LOW (ref 3.87–5.11)
RDW: 16.3 % — ABNORMAL HIGH (ref 11.5–15.5)
WBC: 4.2 10*3/uL (ref 4.0–10.5)
nRBC: 0 % (ref 0.0–0.2)

## 2022-06-09 LAB — CREATININE, SERUM
Creatinine, Ser: 0.83 mg/dL (ref 0.44–1.00)
GFR, Estimated: >60 mL/min (ref >60)

## 2022-06-09 LAB — FERRITIN: Ferritin: 35 ng/mL (ref 11–307)

## 2022-06-09 LAB — FOLATE: Folate: 19.1 ng/mL (ref >5.9)

## 2022-06-09 MED ORDER — LISINOPRIL 10 MG PO TABS
10.0000 mg | ORAL_TABLET | Freq: Every day | ORAL | Status: DC
  Administered 2022-06-11 – 2022-06-13 (×3): 10 mg via ORAL
  Filled 2022-06-09 (×4): qty 1

## 2022-06-09 MED ORDER — HEPARIN SODIUM (PORCINE) 5000 UNIT/ML IJ SOLN
5000.0000 [IU] | Freq: Three times a day (TID) | INTRAMUSCULAR | Status: DC
  Filled 2022-06-09: qty 1

## 2022-06-09 MED ORDER — ACETAMINOPHEN 325 MG PO TABS
650.0000 mg | ORAL_TABLET | ORAL | Status: DC | PRN
  Administered 2022-06-13 (×2): 650 mg via ORAL
  Filled 2022-06-09 (×2): qty 2

## 2022-06-09 MED ORDER — LORAZEPAM 2 MG/ML IJ SOLN
0.5000 mg | Freq: Once | INTRAMUSCULAR | Status: AC
  Administered 2022-06-09: 0.5 mg via INTRAVENOUS
  Filled 2022-06-09: qty 1

## 2022-06-09 MED ORDER — ONDANSETRON HCL 4 MG/2ML IJ SOLN
4.0000 mg | Freq: Four times a day (QID) | INTRAMUSCULAR | Status: DC | PRN
  Administered 2022-06-11: 4 mg via INTRAVENOUS
  Filled 2022-06-09: qty 2

## 2022-06-09 NOTE — ED Provider Notes (Signed)
[facility] EMERGENCY DEPARTMENT AT [facility] Provider Note   CSN: [id number] Arrival date & time: 06/09/22  1552     History  Chief Complaint  Patient presents with   Bradycardia    Sandy Trujillo is a 89 y.o. female history of A-fib on Eliquis, here presenting with abnormal heart rhythm.  Patient was noted to be in A-fib and had cardioversion performed 2 days ago.  She has not feel well since then.  She had a follow-up visit with cardiology today.  Patient was noted to be in high degree heart block and was sent here for further evaluation.  Patient states that she just feels dizzy.  She also feels anxious.  Denies any chest pain or shortness of breath.  The history is provided by the patient.       Home Medications Prior to Admission medications   Medication Sig Start Date End Date Taking? Authorizing Provider  acetaminophen (TYLENOL) 500 MG tablet Take 500-1,000 mg by mouth every 6 (six) hours as needed (pain.).    [provider]  apixaban (ELIQUIS) 5 MG TABS tablet Take 1 tablet (5 mg total) by mouth 2 (two) times daily. 05/25/22 06/24/22  Eustace Pen, PA-C  Cholecalciferol (VITAMIN D3 SUPER STRENGTH) 50 MCG (2000 UT) TABS Take 2,000 Units by mouth 4 (four) times a week.    [provider]  ferrous sulfate 325 (65 FE) MG tablet Take 325 mg by mouth daily at 12 noon.    [provider]  lisinopril (ZESTRIL) 10 MG tablet Take 10 mg by mouth in the morning and at bedtime.    [provider]  loperamide (IMODIUM) 2 MG capsule Take 2-4 mg by mouth 4 (four) times daily as needed for diarrhea or loose stools.    [provider]  loratadine (CLARITIN) 5 MG chewable tablet Chew 5 mg by mouth daily.    [provider]  Magnesium 250 MG TABS Take 250 mg by mouth 3 (three) times a week. Patient not taking: Reported on 06/09/2022    [provider]  Multiple Vitamins-Minerals (CENTRUM SILVER PO) Take 1 tablet by  mouth daily.    [provider]  ORAL ELECTROLYTES PO Take 1 tablet by mouth daily as needed (electrolytes replenishment).    [provider]  ORAL ELECTROLYTES PO Take 500 mLs by mouth as needed (hydration). Nuun Sport Hydration Electrolyte Powder Drink Mix    [provider]      Allergies    Actonel [risedronate], Buspar [buspirone], and Sertraline    Review of Systems   Review of Systems  Neurological:  Positive for dizziness.  All other systems reviewed and are negative.   Physical Exam Updated Vital Signs BP (!) 159/55   Pulse 75   Resp 15   SpO2 100%  Physical Exam Vitals and nursing note reviewed.  Constitutional:      Comments: Chronically ill-appearing  HENT:     Head: Normocephalic.     Nose: Nose normal.     Mouth/Throat:     Mouth: Mucous membranes are moist.  Eyes:     Extraocular Movements: Extraocular movements intact.     Pupils: Pupils are equal, round, and reactive to light.  Cardiovascular:     Rate and Rhythm: Rhythm irregular.     Comments: Bradycardic and irregular Pulmonary:     Effort: Pulmonary effort is normal.     Breath sounds: Normal breath sounds.  Abdominal:     General:  Abdomen is flat.     Palpations: Abdomen is soft.  Musculoskeletal:        General: Normal range of motion.     Cervical back: Normal range of motion and neck supple.  Skin:    General: Skin is warm.     Capillary Refill: Capillary refill takes less than 2 seconds.  Neurological:     General: No focal deficit present.     Mental Status: She is oriented to person, place, and time.  Psychiatric:        Mood and Affect: Mood normal.        Behavior: Behavior normal.     ED Results / Procedures / Treatments   Labs (all labs ordered are listed, but only abnormal results are displayed) Labs Reviewed  CBC WITH DIFFERENTIAL/PLATELET - Abnormal; Notable for the following components:      Result Value   RBC 2.86 (*)    Hemoglobin 7.9 (*)     HCT 25.4 (*)    RDW 16.5 (*)    All other components within normal limits  PROTIME-INR - Abnormal; Notable for the following components:   Prothrombin Time 16.1 (*)    INR 1.3 (*)    All other components within normal limits  I-STAT CHEM 8, ED - Abnormal; Notable for the following components:   Sodium 126 (*)    BUN 55 (*)    Glucose, Bld 105 (*)    Calcium, Ion 1.08 (*)    Hemoglobin 8.5 (*)    HCT 25.0 (*)    All other components within normal limits  COMPREHENSIVE METABOLIC PANEL  MAGNESIUM    EKG EKG Interpretation  Date/Time:  Wednesday Jun 09 2022 16:06:40 EDT Ventricular Rate:  73 PR Interval:    QRS Duration: 88 QT Interval:  418 QTC Calculation: 460 R Axis:   98 Text Interpretation: Junctional rhythm Rightward axis Borderline ECG When compared with ECG of 09-Jun-2022 14:02, PREVIOUS ECG IS PRESENT Confirmed by Richardean Canal [phone]) on 06/09/2022 4:21:59 PM  Radiology DG Chest Portable 1 View  Result Date: 06/09/2022 CLINICAL DATA:  Bradycardia EXAM: PORTABLE CHEST 1 VIEW COMPARISON:  X-ray 05/09/2022. FINDINGS: Small bilateral pleural effusions with some adjacent opacities, similar to previous. Mild thickening along the minor fissure. Stable cardiopericardial silhouette. No edema or pneumothorax. Surgical clips in the upper thorax. Dense left-sided lung nodules consistent with old granulomatous disease. Degenerative changes of the spine. Overlapping cardiac leads and defibrillator pads. IMPRESSION: Stable x-ray. Small effusions and adjacent opacities. Chronic changes elsewhere Electronically Signed   By: Karen Kays M.D.   On: 06/09/2022 16:51    Procedures Procedures    CRITICAL CARE Performed by: Richardean Canal   Total critical care time: 38 minutes  Critical care time was exclusive of separately billable procedures and treating other patients.  Critical care was necessary to treat or prevent imminent or life-threatening deterioration.  Critical care was  time spent personally by me on the following activities: development of treatment plan with patient and/or surrogate as well as nursing, discussions with consultants, evaluation of patient's response to treatment, examination of patient, obtaining history from patient or surrogate, ordering and performing treatments and interventions, ordering and review of laboratory studies, ordering and review of radiographic studies, pulse oximetry and re-evaluation of patient's condition.   Medications Ordered in ED Medications  LORazepam (ATIVAN) injection 0.5 mg (0.5 mg Intravenous Given 06/09/22 1721)    ED Course/ Medical Decision Making/ A&P  Medical Decision Making KEYONA PRUDHOMME is a 89 y.o. female here presenting with bradycardia.  Patient is bradycardic to the 40s.  I reviewed EKG from the office and it appears to be in second-degree heart block.  Patient had multiple EKGs in the ED and goes from junctional block to secondary heart block.  Will consult cardiology for pacemaker.  Patient is not hypotensive and mentating currently.  7:04 PM Cardiology saw patient and will admit for pacemaker placement  Problems Addressed: Heart block: acute illness or injury  Amount and/or Complexity of Data Reviewed Labs: ordered. Decision-making details documented in ED Course. Radiology: ordered and independent interpretation performed. Decision-making details documented in ED Course. ECG/medicine tests: ordered and independent interpretation performed. Decision-making details documented in ED Course.  Risk Prescription drug management. Decision regarding hospitalization.   Final Clinical Impression(s) / ED Diagnoses Final diagnoses:  None    Rx / DC Orders ED Discharge Orders     None         Charlynne Pander, MD 06/09/22 1907

## 2022-06-09 NOTE — ED Triage Notes (Addendum)
Pt arrives via POV. Pt recently had a synchronized cardioversion on the 13th for her Afib. Pt had a follow up appointment today at the afib clinic and was bradycardic. Pt reports weakness, fatigue, and states almost passed out this morning. Pt is AxOx4. Pt denies cp or sob.

## 2022-06-09 NOTE — H&P (Signed)
Cardiology Admission History and Physical   Patient ID: Sandy Trujillo MRN: [id number]; DOB: 02-11-1932   Admission date: 06/09/2022  PCP:  Thana Ates, MD   [facility] Providers Cardiologist:  None        Chief Complaint:  presyncope  Patient Profile:   Sandy Trujillo is a 89 y.o. female with persistent atrial fibrillation s/p DC cardioversion 06/07/2022 who is being seen 06/09/2022 for the evaluation of fatigue and presyncope related to second-degree AV block.  History of Present Illness:   Sandy Trujillo is an 89 year old woman who lives independently, with a history of hypertension on lisinopril, previous hyperparathyroidism status post right inferior parathyroidectomy, GERD, history of rectal cancer treated with surgical resection and ileostomy (since 2007), who was recently diagnosed with atrial fibrillation based on reports from her Apple Watch.  While in atrial fibrillation she did experience fatigue and shortness of breath.  She was started on anticoagulation with Eliquis and underwent cardioversion successfully on 06/07/2022.  She was noted to have periods of second-degree AV block Mobitz type I immediately following cardioversion.  Ever since the cardioversion she has been extremely weak and dizzy and has had several episodes where she is afraid that she would pass out if she does not sit down.  She has not had other focal neurological complaints.  She has not had any bleeding problems on Eliquis.  She has not had any true syncope or any falls.  She denies orthopnea, PND or chest discomfort.  She takes lisinopril for hypertension and does not take any medications with negative chronotropic effect.  In the emergency room she has periods of sinus rhythm with 1: 1 AV conduction with very long first-degree AV block, periods of second-degree AV block Mobitz type I with heart rates as low as the mid 40s as well as periods of junctional rhythm with retrograde P waves at  rates of about 60 bpm.  The episodes of junctional rhythm typically start after a dropped beat at the end of a Wenckebach cycle, and then can be sustained for several seconds with consistent retrograde 1:1 VA conduction.  She is very anxious.  She has received a small dose of benzodiazepine and is slightly sleeping and less anxious.  She is able to hold a coherent conversation and is fully oriented.  Her daughter Sandy Trujillo was also here for the entire conversation providing additional details.  Prior to the problems with atrial fibrillation Mrs. Trujillo was living independently, taking care of her household chores, with frequent "check-in's" by her 3 children who live here in town.  She is a widow.   Past Medical History:  Diagnosis Date   Anemia    Anxiety    GERD (gastroesophageal reflux disease)    Hyperparathyroidism (HCC) 2019   Hypertension    Ileostomy in place [facility])    Osteoporosis 2019   Pneumonia    hx of   rectal ca 2007   surg only    Past Surgical History:  Procedure Laterality Date   ABDOMINAL HYSTERECTOMY     BREAST EXCISIONAL BIOPSY Bilateral    benign   CARDIOVERSION N/A 06/07/2022   Procedure: CARDIOVERSION;  Surgeon: Sande Rives, MD;  Location: [facility] INVASIVE CV LAB;  Service: Cardiovascular;  Laterality: N/A;   COLON SURGERY  2007   PARATHYROIDECTOMY N/A 05/26/2021   Procedure: NECK EXPLORATION WITH RIGHT INFERIOR PARATHYROIDECTOMY;  Surgeon: Darnell Level, MD;  Location: [facility] ORS;  Service: General;  Laterality: N/A;   surgery for  spin al stenosis      bone spur touching nerve per pt     Medications Prior to Admission: Prior to Admission medications   Medication Sig Start Date End Date Taking? Authorizing Provider  acetaminophen (TYLENOL) 500 MG tablet Take 500-1,000 mg by mouth every 6 (six) hours as needed (pain.).    [provider]  apixaban (ELIQUIS) 5 MG TABS tablet Take 1 tablet (5 mg total) by mouth 2 (two) times daily. 05/25/22 06/24/22   Eustace Pen, PA-C  Cholecalciferol (VITAMIN D3 SUPER STRENGTH) 50 MCG (2000 UT) TABS Take 2,000 Units by mouth 4 (four) times a week.    [provider]  ferrous sulfate 325 (65 FE) MG tablet Take 325 mg by mouth daily at 12 noon.    [provider]  lisinopril (ZESTRIL) 10 MG tablet Take 10 mg by mouth in the morning and at bedtime.    [provider]  loperamide (IMODIUM) 2 MG capsule Take 2-4 mg by mouth 4 (four) times daily as needed for diarrhea or loose stools.    [provider]  loratadine (CLARITIN) 5 MG chewable tablet Chew 5 mg by mouth daily.    [provider]  Magnesium 250 MG TABS Take 250 mg by mouth 3 (three) times a week. Patient not taking: Reported on 06/09/2022    [provider]  Multiple Vitamins-Minerals (CENTRUM SILVER PO) Take 1 tablet by mouth daily.    [provider]  ORAL ELECTROLYTES PO Take 1 tablet by mouth daily as needed (electrolytes replenishment).    [provider]  ORAL ELECTROLYTES PO Take 500 mLs by mouth as needed (hydration). Nuun Sport Hydration Electrolyte Powder Drink Mix    [provider]     Allergies:    Allergies  Allergen Reactions   Actonel [Risedronate] Other (See Comments)    Lowered BP   Buspar [Buspirone] Other (See Comments)    Increased risk for Afib   Sertraline Diarrhea    Social History:   Social History   Socioeconomic History   Marital status: Widowed    Spouse name: Not on file   Number of children: Not on file   Years of education: Not on file   Highest education level: Not on file  Occupational History   Not on file  Tobacco Use   Smoking status: Never   Smokeless tobacco: Never   Tobacco comments:    Never smoke 05/25/22  Vaping Use   Vaping Use: Never used  Substance and Sexual Activity   Alcohol use: Not Currently   Drug use: Never   Sexual activity: Not on file  Other Topics Concern   Not on file  Social History  Narrative   Not on file   Social Determinants of Health   Financial Resource Strain: Not on file  Food Insecurity: No Food Insecurity (05/09/2022)   Hunger Vital Sign    Worried About Running Out of Food in the Last Year: Never true    Ran Out of Food in the Last Year: Never true  Transportation Needs: No Transportation Needs (05/09/2022)   PRAPARE - Administrator, Civil Service (Medical): No    Lack of Transportation (Non-Medical): No  Physical Activity: Not on file  Stress: Not on file  Social Connections: Not on file  Intimate Partner Violence: Not At Risk (05/09/2022)   Humiliation, Afraid, Rape, and Kick questionnaire    Fear of Current or Ex-Partner: No    Emotionally  Abused: No    Physically Abused: No    Sexually Abused: No    Family History:   The patient's family history significantly negative for early onset coronary/vascular disease or arrhythmia  ROS:  Please see the history of present illness.  All other ROS reviewed and negative.     Physical Exam/Data:   Vitals:   06/09/22 1602 06/09/22 1625 06/09/22 1645 06/09/22 1715  BP: (!) 177/88 (!) 180/106 (!) 172/63 (!) 159/55  Pulse: 76 (!) 55 (!) 57 75  Resp: 17 16 16 15   SpO2: 100% 100% 100% 100%   No intake or output data in the 24 hours ending 06/09/22 1810    06/09/2022    1:53 PM 06/07/2022    8:58 AM 05/25/2022    2:20 PM  Last 3 Weights  Weight (lbs) 132 lb 130 lb 127 lb 12.8 oz  Weight (kg) 59.875 kg 58.968 kg 57.97 kg     There is no height or weight on file to calculate BMI.  General:  Well nourished, well developed, in no acute distress.  She appears weak and a little sleepy. HEENT: normal Neck: no JVD, but she does have intermittent cannon A waves. Vascular: No carotid bruits; Distal pulses 2+ bilaterally   Cardiac:  normal S1, S2; RRR; 2/6 early peaking aortic ejection murmur, 1/6 apical holosystolic murmur, no diastolic murmur or gallop Lungs:  clear to auscultation bilaterally, no  wheezing, rhonchi or rales  Abd: soft, nontender, no hepatomegaly.  Has an ileostomy.  The site appears healthy. Ext: no edema Musculoskeletal:  No deformities, BUE and BLE strength normal and equal Skin: warm and dry  Neuro:  CNs 2-12 intact, no focal abnormalities noted Psych:  Normal affect    EKG: Multiple ECGs have been performed today and are reviewed.  Initial ECGs show sinus rhythm with first-degree AV block Mobitz type I and bradycardia at 43 bpm.  A couple of junctional beats with retrograde P waves are seen.  Subsequent ECG showed accelerated junctional rhythm with retrograde P waves at about 75 bpm.  Relevant CV Studies: Echocardiogram 05/10/2022    1. Left ventricular ejection fraction, by estimation, is 50 to 55%. The  left ventricle has low normal function. The left ventricle has no regional  wall motion abnormalities. Left ventricular diastolic parameters are  indeterminate.   2. Right ventricular systolic function is mildly reduced. The right  ventricular size is mildly enlarged. There is mildly elevated pulmonary  artery systolic pressure.   3. Left atrial size was severely dilated.   4. Right atrial size was moderately dilated.   5. The mitral valve is normal in structure. Moderate mitral valve  regurgitation. No evidence of mitral stenosis.   6. Tricuspid valve regurgitation is moderate.   7. The aortic valve is tricuspid. There is mild calcification of the  aortic valve. There is mild thickening of the aortic valve. Aortic valve  regurgitation is not visualized. No aortic stenosis is present.   8. The inferior vena cava is normal in size with greater than 50%  respiratory variability, suggesting right atrial pressure of 3 mmHg.   Laboratory Data:  High Sensitivity Troponin:  No results for input(s): "TROPONINIHS" in the last 720 hours.    Chemistry Recent Labs  Lab 06/09/22 1626 06/09/22 1638  NA 128* 126*  K 4.6 5.0  CL 95* 99  CO2 23  --   GLUCOSE  103* 105*  BUN 44* 55*  CREATININE 0.83 0.90  CALCIUM 9.6  --  MG 2.2  --   GFRNONAA >60  --   ANIONGAP 10  --     Recent Labs  Lab 06/09/22 1626  PROT 7.0  ALBUMIN 3.6  AST 41  ALT 41  ALKPHOS 82  BILITOT 0.4   Lipids No results for input(s): "CHOL", "TRIG", "HDL", "LABVLDL", "LDLCALC", "CHOLHDL" in the last 168 hours. Hematology Recent Labs  Lab 06/09/22 1626 06/09/22 1638  WBC 5.7  --   RBC 2.86*  --   HGB 7.9* 8.5*  HCT 25.4* 25.0*  MCV 88.8  --   MCH 27.6  --   MCHC 31.1  --   RDW 16.5*  --   PLT 211  --    Thyroid No results for input(s): "TSH", "FREET4" in the last 168 hours. BNPNo results for input(s): "BNP", "PROBNP" in the last 168 hours.  DDimer No results for input(s): "DDIMER" in the last 168 hours.   Radiology/Studies:  DG Chest Portable 1 View  Result Date: 06/09/2022 CLINICAL DATA:  Bradycardia EXAM: PORTABLE CHEST 1 VIEW COMPARISON:  X-ray 05/09/2022. FINDINGS: Small bilateral pleural effusions with some adjacent opacities, similar to previous. Mild thickening along the minor fissure. Stable cardiopericardial silhouette. No edema or pneumothorax. Surgical clips in the upper thorax. Dense left-sided lung nodules consistent with old granulomatous disease. Degenerative changes of the spine. Overlapping cardiac leads and defibrillator pads. IMPRESSION: Stable x-ray. Small effusions and adjacent opacities. Chronic changes elsewhere Electronically Signed   By: Karen Kays M.D.   On: 06/09/2022 16:51     Assessment and Plan:   Symptomatic bradycardia due to second-degree AV block Mobitz type I: Not attributable to medications or to a thyroid disorder.  She will require dual-chamber permanent pacemaker.  Ideally, we would wait for 30 days following her cardioversion so that we can stop her anticoagulant completely, but she is quite symptomatic.  Her daughter does not think she can return home in the current status.  Discussed with EP.  Will hold tonight's and  tomorrow morning's dose of anticoagulation to reduce the risk of bleeding complications with pacemaker implantation, but would plan to resume anticoagulation promptly after the procedure if there is no bleeding.  We discussed the pros and cons of pacemaker implantation, device longevity and monitoring, potential complications at the time of implantation including the risks of lead dislodgment and need for reoperation, pneumothorax, perforation, infection and a small risk of death due to complications.  Both the patient and her daughter asked several questions and would like to go ahead with the procedure. Persistent atrial fibrillation: She had spontaneous rate control when she was in atrial fibrillation in keeping with the presence of significant AV node disease.  Has not had any overt bleeding complications taking Eliquis over the last month. Anemia: She was mildly anemic even before initiation of anticoagulation at 11.6 in April 2023 and 9.6-9.9 in mid April 2024.  She has normocytic normochromic indices.  There has been a slight decline in hemoglobin since then with a hemoglobin today of 7.9.  She has not had any overt bleeding.  Will check for nutritional deficiencies, including iron deficiency. Hyponatremia: Likewise this is a chronic abnormality with low sodium levels documented as far back as 2015.  The etiology is unclear.  Will monitor. History of rectal cancer: She had surgery only with ileostomy.  She has not had any clinical evidence of malignancy recurrence since the initial surgery in 2007. History of hyperparathyroidism: Calcium levels have been normal since she had her partial parathyroidectomy.  Aortic atherosclerosis/coronary calcifications: This was incidentally noted on aCT urogram of the chest performed to exclude pulmonary embolism a month ago, when she was first diagnosed with atrial fibrillation.  On my review the degree of vascular calcification is probably average for an octogenarian.   She has not had angina or other clinical evidence of significant CAD.  She does not have ischemic changes on the ECG.  The aorta is normal in caliber.  There was no evidence of pulmonary embolism. GERD: Note is made on the CT of her chest that she has a patulous esophagus with fluid and debris extending into the upper esophagus.  She would therefore be considered at risk for aspiration. Anxiety: A longstanding issue for her.   Risk Assessment/Risk Scores:         CHA2DS2-VASc Score = 5   This indicates a 7.2% annual risk of stroke. The patient's score is based upon: CHF History: 0 HTN History: 1 Diabetes History: 0 Stroke History: 0 Vascular Disease History: 1 Age Score: 2 Gender Score: 1      Severity of Illness: The appropriate patient status for this patient is INPATIENT. Inpatient status is judged to be reasonable and necessary in order to provide the required intensity of service to ensure the patient's safety. The patient's presenting symptoms, physical exam findings, and initial radiographic and laboratory data in the context of their chronic comorbidities is felt to place them at high risk for further clinical deterioration. Furthermore, it is not anticipated that the patient will be medically stable for discharge from the hospital within 2 midnights of admission.   * I certify that at the point of admission it is my clinical judgment that the patient will require inpatient hospital care spanning beyond 2 midnights from the point of admission due to high intensity of service, high risk for further deterioration and high frequency of surveillance required.*   For questions or updates, please contact [facility] Please consult [email or web address] for contact info under     Signed, Thurmon Fair, MD  06/09/2022 6:10 PM

## 2022-06-09 NOTE — ED Notes (Signed)
Attempt made to contact admit MD regarding patient's critical lab value.  Waiting for return call at this time

## 2022-06-09 NOTE — Progress Notes (Signed)
Primary Care Physician: Thana Ates, MD Primary Cardiologist: None Primary Electrophysiologist: None Referring Physician: [facility]   Sandy Trujillo is a 89 y.o. female with a history of rectal cancer s/p colectomy with ileostomy, anxiety, GERD, aortic atherosclerosis, hyperparathyroidism, HTN, and atrial fibrillation who presents for consultation in the [facility]. The patient was initially diagnosed with atrial fibrillation upon presenting to urgent care due to Apple Watch reporting Afib over several consecutive days in the setting of increased stress/anxiety and chronic fatigue. She was sent from urgent care to ED and admitted 4/14-15/24. She was discharged still in rate controlled Afib with no nodal agent given; sent home on Eliquis 5 mg BID. Patient is on Eliquis 5 mg BID for a CHADS2VASC score of 5.  On evaluation 05/25/22, she is currently in rate controlled Afib. She feels tired sometimes but denies any chest pain. Some short of breath but not always. She began taking two full doses of Eliquis on 4/16.   She is compliant with anticoagulation and has not missed any doses. She has no bleeding concerns.  On follow up 06/09/22, she is currently in Wenckebach. She is s/p DCCV on 5/13 with successful conversion to NSR with 1 shock. Post DCCV noted by Dr. Flora Lipps to have sinus bradycardia with intermittent Wenckebach and runs of atrial tachycardia. She tells me today she feels anxious. Daughters in room note she has been very tired and lethargic since cardioversion. This morning, daughter specifically tells me patient did not want to get out of bed because she felt pre syncopal. She has not missed any doses of anticoagulation.   Today, she denies symptoms of palpitations, chest pain, orthopnea, PND, lower extremity edema, dizziness, presyncope, syncope, snoring, daytime somnolence, bleeding, or neurologic sequela. The patient is tolerating medications without  difficulties and is otherwise without complaint today.   Atrial Fibrillation Risk Factors:  she does have symptoms or diagnosis of sleep apnea. she does not have a history of rheumatic fever. she does not have a history of alcohol use. The patient does not have a history of early familial atrial fibrillation or other arrhythmias.  she has a BMI of Body mass index is 25.78 kg/m.Marland Kitchen Filed Weights   06/09/22 1353  Weight: 59.9 kg     No family history on file.   Atrial Fibrillation Management history:  Previous antiarrhythmic drugs: None Previous cardioversions: None Previous ablations: None Anticoagulation history: Eliquis 5 mg BID   Past Medical History:  Diagnosis Date   Anemia    Anxiety    GERD (gastroesophageal reflux disease)    Hyperparathyroidism (HCC) 2019   Hypertension    Ileostomy in place [facility])    Osteoporosis 2019   Pneumonia    hx of   rectal ca 2007   surg only   Past Surgical History:  Procedure Laterality Date   ABDOMINAL HYSTERECTOMY     BREAST EXCISIONAL BIOPSY Bilateral    benign   CARDIOVERSION N/A 06/07/2022   Procedure: CARDIOVERSION;  Surgeon: Sande Rives, MD;  Location: [facility] INVASIVE CV LAB;  Service: Cardiovascular;  Laterality: N/A;   COLON SURGERY  2007   PARATHYROIDECTOMY N/A 05/26/2021   Procedure: NECK EXPLORATION WITH RIGHT INFERIOR PARATHYROIDECTOMY;  Surgeon: Darnell Level, MD;  Location: [facility] ORS;  Service: General;  Laterality: N/A;   surgery for spin al stenosis      bone spur touching nerve per pt    Current Outpatient Medications  Medication Sig Dispense Refill  acetaminophen (TYLENOL) 500 MG tablet Take 500-1,000 mg by mouth every 6 (six) hours as needed (pain.).     apixaban (ELIQUIS) 5 MG TABS tablet Take 1 tablet (5 mg total) by mouth 2 (two) times daily. 60 tablet 4   Cholecalciferol (VITAMIN D3 SUPER STRENGTH) 50 MCG (2000 UT) TABS Take 2,000 Units by mouth 4 (four) times a week.     ferrous sulfate 325 (65 FE)  MG tablet Take 325 mg by mouth daily at 12 noon.     lisinopril (ZESTRIL) 10 MG tablet Take 10 mg by mouth in the morning and at bedtime.     loperamide (IMODIUM) 2 MG capsule Take 2-4 mg by mouth 4 (four) times daily as needed for diarrhea or loose stools.     loratadine (CLARITIN) 5 MG chewable tablet Chew 5 mg by mouth daily.     Multiple Vitamins-Minerals (CENTRUM SILVER PO) Take 1 tablet by mouth daily.     ORAL ELECTROLYTES PO Take 1 tablet by mouth daily as needed (electrolytes replenishment).     ORAL ELECTROLYTES PO Take 500 mLs by mouth as needed (hydration). Nuun Sport Hydration Electrolyte Powder Drink Mix     Magnesium 250 MG TABS Take 250 mg by mouth 3 (three) times a week. (Patient not taking: Reported on 06/09/2022)     No current facility-administered medications for this encounter.    Allergies  Allergen Reactions   Actonel [Risedronate] Other (See Comments)    Lowered BP   Buspar [Buspirone] Other (See Comments)    Increased risk for Afib   Sertraline Diarrhea    Social History   Socioeconomic History   Marital status: Widowed    Spouse name: Not on file   Number of children: Not on file   Years of education: Not on file   Highest education level: Not on file  Occupational History   Not on file  Tobacco Use   Smoking status: Never   Smokeless tobacco: Never   Tobacco comments:    Never smoke 05/25/22  Vaping Use   Vaping Use: Never used  Substance and Sexual Activity   Alcohol use: Not Currently   Drug use: Never   Sexual activity: Not on file  Other Topics Concern   Not on file  Social History Narrative   Not on file   Social Determinants of Health   Financial Resource Strain: Not on file  Food Insecurity: No Food Insecurity (05/09/2022)   Hunger Vital Sign    Worried About Running Out of Food in the Last Year: Never true    Ran Out of Food in the Last Year: Never true  Transportation Needs: No Transportation Needs (05/09/2022)   PRAPARE -  Administrator, Civil Service (Medical): No    Lack of Transportation (Non-Medical): No  Physical Activity: Not on file  Stress: Not on file  Social Connections: Not on file  Intimate Partner Violence: Not At Risk (05/09/2022)   Humiliation, Afraid, Rape, and Kick questionnaire    Fear of Current or Ex-Partner: No    Emotionally Abused: No    Physically Abused: No    Sexually Abused: No     ROS- All systems are reviewed and negative except as per the HPI above.  Physical Exam: Vitals:   06/09/22 1353  BP: (!) 164/70  Pulse: (!) 43  Weight: 59.9 kg  Height: 5' (1.524 m)     GEN- The patient is well appearing, alert and oriented x 3 today.  Head- normocephalic, atraumatic Eyes-  Sclera clear, conjunctiva pink Ears- hearing intact Oropharynx- clear Neck- supple, no JVP Lymph- no cervical lymphadenopathy Lungs- Clear to ausculation bilaterally, normal work of breathing Heart- Bradycardic rate and rhythm, no murmurs, rubs or gallops, PMI not laterally displaced GI- soft, NT, ND, + BS Extremities- no clubbing, cyanosis, or edema MS- no significant deformity or atrophy Skin- no rash or lesion Psych- euthymic mood, full affect Neuro- strength and sensation are intact   Wt Readings from Last 3 Encounters:  06/09/22 59.9 kg  06/07/22 59 kg  05/25/22 58 kg    EKG today demonstrates  Vent. rate 43 BPM PR interval * ms QRS duration 92 ms QT/QTcB 442/373 ms P-R-T axes 69 98 58 Undetermined rhythm Rightward axis Incomplete right bundle branch block Borderline ECG When compared with ECG of 07-Jun-2022 10:19, PREVIOUS ECG IS PRESENT  Echo 05/10/22 demonstrated:  1. Left ventricular ejection fraction, by estimation, is 50 to 55%. The  left ventricle has low normal function. The left ventricle has no regional  wall motion abnormalities. Left ventricular diastolic parameters are  indeterminate.   2. Right ventricular systolic function is mildly reduced. The  right  ventricular size is mildly enlarged. There is mildly elevated pulmonary  artery systolic pressure.   3. Left atrial size was severely dilated.   4. Right atrial size was moderately dilated.   5. The mitral valve is normal in structure. Moderate mitral valve  regurgitation. No evidence of mitral stenosis.   6. Tricuspid valve regurgitation is moderate.   7. The aortic valve is tricuspid. There is mild calcification of the  aortic valve. There is mild thickening of the aortic valve. Aortic valve  regurgitation is not visualized. No aortic stenosis is present.   8. The inferior vena cava is normal in size with greater than 50%  respiratory variability, suggesting right atrial pressure of 3 mmHg.   Epic records are reviewed at length today.  CHA2DS2-VASc Score = 5  The patient's score is based upon: CHF History: 0 HTN History: 1 Diabetes History: 0 Stroke History: 0 Vascular Disease History: 1 Age Score: 2 Gender Score: 1       ASSESSMENT AND PLAN: Persistent Atrial Fibrillation (ICD10:  I48.19) The patient's CHA2DS2-VASc score is 5, indicating a 7.2% annual risk of stroke.   S/p successful DCCV on 06/07/22.   She is currently in what appears to be Wenckebach rhythm.   Dr. Flora Lipps noted sinus brady into the 40s after DCCV with lots of Wenckebach and runs of atrial tachycardia. She is symptomatic today and pre syncopal earlier today. Given these new symptoms and AV block, discussion with EP for plan of care. After discussion, agreed that patient should be sent to the ED for admission for consideration of urgent pacemaker implantation.   Going forward in the future for Afib treatment options, Tikosyn and amiodarone were briefly discussed as options for medication therapy. CrCl 46 mL/min would make her eligible for Tikosyn 250 mcg BID.  2. Secondary Hypercoagulable State (ICD10:  D68.69) The patient is at significant risk for stroke/thromboembolism based upon her CHA2DS2-VASc  Score of 5.  Continue Apixaban (Eliquis).  Continue without interruption. No missed doses.  Of note, she weighs 59.9 kg. Will continue at current dosage.  3. Snoring with concern for Obstructive sleep apnea Sleep study ordered previously.  4. HTN Elevated today, advised to please trend at home.    Patient sent to ED.    Lake Bells, PA-C [facility]  [facility] [address] [place], [place] [address] [phone] 06/09/2022 3:02 PM

## 2022-06-09 NOTE — ED Provider Triage Note (Signed)
Emergency Medicine Provider Triage Evaluation Note  Sandy Trujillo , a 89 y.o. female  was evaluated in triage.  Pt complains of bradycardia. Evaluated with found to be on in atrial flutter and afib? She is anticoagulated. Patient is on Eliquis 5 mg BID for a CHADS2VASC score of 5. She was sent here for urgent pacemaker placement.    Review of Systems  Positive: Fatigue, sob Negative:   Physical Exam  BP (!) 177/88 (BP Location: Right Arm)   Pulse 76   Resp 17   SpO2 100%  Gen:   Awake, no distress   Resp:  Normal effort  MSK:   Moves extremities without difficulty Other:    Medical Decision Making  Medically screening exam initiated at 4:04 PM.  Appropriate orders placed.  NASRO GILLYARD was informed that the remainder of the evaluation will be completed by another provider, this initial triage assessment does not replace that evaluation, and the importance of remaining in the ED until their evaluation is complete.  PATIENT urgently moved to room [address]   Sandy Trujillo, [place] 06/09/22 1608

## 2022-06-09 NOTE — ED Notes (Signed)
Lisinopril held due to low BP while sleeping.  Patient woken from sleep and BP rechecked.  Improvement noted.  Patient alert and oriented.  Will continue to monitor

## 2022-06-09 NOTE — ED Notes (Signed)
Diagnosed with afib 4 weeks ago and is on blood thinners 3 weeks ago. Seen at afib clinic a few days ago and was cardioverted. Pt came today as pt felt like she was going to pass out this morning. Feels tired and nauseated. Pt is alert and oriented x 4. Bradycardic in the 40s from Afib clinic EKG. Cardiac monitoring in place. Zoll monitor placed on pt. Family at bedside.

## 2022-06-10 ENCOUNTER — Encounter (HOSPITAL_COMMUNITY)
Admission: EM | Disposition: A | Discharge status: Home / Self Care | Payer: Self-pay | Source: Home / Self Care | Attending: Internal Medicine

## 2022-06-10 ENCOUNTER — Other Ambulatory Visit: Payer: Self-pay

## 2022-06-10 ENCOUNTER — Encounter (HOSPITAL_COMMUNITY): Payer: Self-pay | Admitting: Cardiovascular Disease

## 2022-06-10 DIAGNOSIS — Z7901 Long term (current) use of anticoagulants: Secondary | ICD-10-CM | POA: Diagnosis not present

## 2022-06-10 DIAGNOSIS — J309 Allergic rhinitis, unspecified: Secondary | ICD-10-CM

## 2022-06-10 DIAGNOSIS — Z932 Ileostomy status: Secondary | ICD-10-CM

## 2022-06-10 DIAGNOSIS — I10 Essential (primary) hypertension: Secondary | ICD-10-CM

## 2022-06-10 DIAGNOSIS — Z8719 Personal history of other diseases of the digestive system: Secondary | ICD-10-CM

## 2022-06-10 DIAGNOSIS — K922 Gastrointestinal hemorrhage, unspecified: Secondary | ICD-10-CM | POA: Diagnosis present

## 2022-06-10 DIAGNOSIS — K921 Melena: Secondary | ICD-10-CM

## 2022-06-10 DIAGNOSIS — I4891 Unspecified atrial fibrillation: Secondary | ICD-10-CM | POA: Diagnosis not present

## 2022-06-10 DIAGNOSIS — F411 Generalized anxiety disorder: Secondary | ICD-10-CM

## 2022-06-10 DIAGNOSIS — E871 Hypo-osmolality and hyponatremia: Secondary | ICD-10-CM | POA: Diagnosis not present

## 2022-06-10 DIAGNOSIS — K51919 Ulcerative colitis, unspecified with unspecified complications: Secondary | ICD-10-CM | POA: Diagnosis not present

## 2022-06-10 DIAGNOSIS — I441 Atrioventricular block, second degree: Secondary | ICD-10-CM

## 2022-06-10 DIAGNOSIS — D649 Anemia, unspecified: Secondary | ICD-10-CM

## 2022-06-10 LAB — LIPID PANEL
Cholesterol: 189 mg/dL (ref 0–200)
HDL: 75 mg/dL (ref >40)
LDL Cholesterol: 98 mg/dL (ref 0–99)
Total CHOL/HDL Ratio: 2.5 RATIO
Triglycerides: 80 mg/dL (ref <150)
VLDL: 16 mg/dL (ref 0–40)

## 2022-06-10 LAB — TYPE AND SCREEN
Antibody Screen: NEGATIVE
Unit division: 0

## 2022-06-10 LAB — URINALYSIS, ROUTINE W REFLEX MICROSCOPIC
Bilirubin Urine: NEGATIVE
Glucose, UA: NEGATIVE mg/dL
Hgb urine dipstick: NEGATIVE
Ketones, ur: NEGATIVE mg/dL
Leukocytes,Ua: NEGATIVE
Nitrite: NEGATIVE
Protein, ur: NEGATIVE mg/dL
Specific Gravity, Urine: 1.012 (ref 1.005–1.030)
pH: 5 (ref 5.0–8.0)

## 2022-06-10 LAB — CBC WITH DIFFERENTIAL/PLATELET
Abs Immature Granulocytes: 0.03 10*3/uL (ref 0.00–0.07)
Basophils Absolute: 0 10*3/uL (ref 0.0–0.1)
Basophils Relative: 1 %
Eosinophils Absolute: 0.2 10*3/uL (ref 0.0–0.5)
Eosinophils Relative: 3 %
HCT: 26.7 % — ABNORMAL LOW (ref 36.0–46.0)
Hemoglobin: 8.4 g/dL — ABNORMAL LOW (ref 12.0–15.0)
Immature Granulocytes: 1 %
Lymphocytes Relative: 14 %
Lymphs Abs: 0.8 10*3/uL (ref 0.7–4.0)
MCH: 26.9 pg (ref 26.0–34.0)
MCHC: 31.5 g/dL (ref 30.0–36.0)
MCV: 85.6 fL (ref 80.0–100.0)
Monocytes Absolute: 0.4 10*3/uL (ref 0.1–1.0)
Monocytes Relative: 7 %
Neutro Abs: 4.4 10*3/uL (ref 1.7–7.7)
Neutrophils Relative %: 74 %
Platelets: 192 10*3/uL (ref 150–400)
RBC: 3.12 MIL/uL — ABNORMAL LOW (ref 3.87–5.11)
RDW: 17.1 % — ABNORMAL HIGH (ref 11.5–15.5)
WBC: 5.8 10*3/uL (ref 4.0–10.5)
nRBC: 0 % (ref 0.0–0.2)

## 2022-06-10 LAB — CORTISOL-AM, BLOOD: Cortisol - AM: 12.8 ug/dL (ref 6.7–22.6)

## 2022-06-10 LAB — BPAM RBC: Unit Type and Rh: 9500

## 2022-06-10 LAB — BASIC METABOLIC PANEL
Anion gap: 10 (ref 5–15)
BUN: 37 mg/dL — ABNORMAL HIGH (ref 8–23)
CO2: 23 mmol/L (ref 22–32)
Calcium: 9 mg/dL (ref 8.9–10.3)
Chloride: 99 mmol/L (ref 98–111)
Creatinine, Ser: 0.77 mg/dL (ref 0.44–1.00)
GFR, Estimated: >60 mL/min (ref >60)
Glucose, Bld: 80 mg/dL (ref 70–99)
Potassium: 4.6 mmol/L (ref 3.5–5.1)
Sodium: 132 mmol/L — ABNORMAL LOW (ref 135–145)

## 2022-06-10 LAB — HEMOGLOBIN AND HEMATOCRIT, BLOOD
HCT: 21.3 % — ABNORMAL LOW (ref 36.0–46.0)
Hemoglobin: 6.8 g/dL — CL (ref 12.0–15.0)

## 2022-06-10 LAB — PREPARE RBC (CROSSMATCH)

## 2022-06-10 LAB — CREATININE, URINE, RANDOM: Creatinine, Urine: 38 mg/dL

## 2022-06-10 LAB — CBC
HCT: 27.6 % — ABNORMAL LOW (ref 36.0–46.0)
Hemoglobin: 8.9 g/dL — ABNORMAL LOW (ref 12.0–15.0)
MCH: 27.1 pg (ref 26.0–34.0)
MCHC: 32.2 g/dL (ref 30.0–36.0)
MCV: 84.1 fL (ref 80.0–100.0)
Platelets: 197 10*3/uL (ref 150–400)
RBC: 3.28 MIL/uL — ABNORMAL LOW (ref 3.87–5.11)
RDW: 17.5 % — ABNORMAL HIGH (ref 11.5–15.5)
WBC: 6.2 10*3/uL (ref 4.0–10.5)
nRBC: 0 % (ref 0.0–0.2)

## 2022-06-10 LAB — OSMOLALITY: Osmolality: 292 mOsm/kg (ref 275–295)

## 2022-06-10 LAB — TSH: TSH: 2.662 u[IU]/mL (ref 0.350–4.500)

## 2022-06-10 LAB — SODIUM, URINE, RANDOM: Sodium, Ur: 31 mmol/L

## 2022-06-10 LAB — OSMOLALITY, URINE: Osmolality, Ur: 448 mOsm/kg (ref 300–900)

## 2022-06-10 LAB — MAGNESIUM: Magnesium: 1.9 mg/dL (ref 1.7–2.4)

## 2022-06-10 LAB — POC OCCULT BLOOD, ED: Fecal Occult Bld: POSITIVE — AB

## 2022-06-10 SURGERY — PACEMAKER IMPLANT

## 2022-06-10 MED ORDER — PANTOPRAZOLE INFUSION (NEW) - SIMPLE MED
8.0000 mg/h | INTRAVENOUS | Status: DC
  Administered 2022-06-10 – 2022-06-12 (×5): 8 mg/h via INTRAVENOUS
  Filled 2022-06-10 (×9): qty 100

## 2022-06-10 MED ORDER — SODIUM CHLORIDE 0.9% IV SOLUTION: Freq: Once | INTRAVENOUS | Status: AC

## 2022-06-10 MED ORDER — PANTOPRAZOLE SODIUM 40 MG IV SOLR: 40.0000 mg | Freq: Two times a day (BID) | INTRAVENOUS | Status: DC

## 2022-06-10 MED ORDER — VITAMIN D 25 MCG (1000 UNIT) PO TABS
2000.0000 [IU] | ORAL_TABLET | ORAL | Status: DC
  Administered 2022-06-12 – 2022-06-13 (×2): 2000 [IU] via ORAL
  Filled 2022-06-10 (×2): qty 2

## 2022-06-10 MED ORDER — LORATADINE 10 MG PO TABS
5.0000 mg | ORAL_TABLET | Freq: Every day | ORAL | Status: DC
  Administered 2022-06-11 – 2022-06-13 (×3): 5 mg via ORAL
  Filled 2022-06-10 (×3): qty 1

## 2022-06-10 MED ORDER — SODIUM CHLORIDE 0.9 % IV SOLN: INTRAVENOUS | Status: DC

## 2022-06-10 MED ORDER — PANTOPRAZOLE 80MG IVPB - SIMPLE MED
80.0000 mg | Freq: Once | INTRAVENOUS | Status: AC
  Administered 2022-06-10: 80 mg via INTRAVENOUS
  Filled 2022-06-10: qty 100

## 2022-06-10 NOTE — Consult Note (Addendum)
ELECTROPHYSIOLOGY CONSULT NOTE    Patient ID: Sandy Trujillo MRN: [id number], DOB/AGE: Jun 13, 1932 89 y.o.  Admit date: 06/09/2022 Date of Consult: 06/10/2022  Primary Physician: Thana Ates, MD Primary Cardiologist: None  Electrophysiologist: Dr. Nelly Laurence   Referring Provider: Dr. Royann Shivers  Patient Profile: Sandy Trujillo is a 89 y.o. female with a history of persistent AF, aortic atherosclerosis, HTN, rectal cancer s/p colectomy with ileostomy, anxiety, hyperparathyroidism & GERD who is being seen today for the evaluation of symptomatic bradycardia at the request of Dr. Royann Shivers.  HPI:  Sandy Trujillo is a 89 y.o. female who presented to [facility] ER on 5/15 with reports of weakness, fatigue, near syncope am of presentation.   She was recently admitted 4/14-4/15/24 for atrial fibrillation. She was alerted to the issue by her Apple watch.  She reportedly had acute situation stress/anxiety over those days.  She was discharged in rate controlled AF with no nodal agent, on Eliquis 5mg  BID.  CHADS2VASC of 5.  She followed up in the AF Clinic on 4/30 & was in rate controlled AF - reported fatigue and occasional SOB.  She endorsed compliance with her anticoagulation. She underwent DCCV on 5/13 with successful cardioversion to NSR with 1 AVB after 1 shock. She followed up 5/15 post DCCV she was bradycardic with intermittent Wenckebach & runs of atrial tachycardia. She reported tiredness/lethargy since cardioversion. So much so that she was afraid to get out of bed for fear of passing out / pre-syncopal feelings.  Given symptoms in clinic, she was admitted 5/15 for further evaluation.   Reports weakness, fatigue, SOB. Reports she has been feeling poorly since December 2023.  Had a significantly stressful interaction with her neighbor and since that time has felt weak, tired.  Initially thought it was related to the stress of the event. Family noted change in appetite, decreased intake ("eating  like a bird") and decreased activity level. Pt reports her stool output has been dark for some time.   She denies chest pain, palpitations, dyspnea, PND, orthopnea, nausea, vomiting, dizziness, syncope, edema, weight gain, or early satiety.     Labs Potassium5.0 (05/15 1638) Magnesium  2.2 (05/15 1626) Creatinine, ser  0.83 (05/15 2150) PLT  189 (05/15 2150) HGB  6.8* (05/15 2330) WBC 4.2 (05/15 2150)  .    Past Medical History:  Diagnosis Date   Anemia    Anxiety    GERD (gastroesophageal reflux disease)    Hyperparathyroidism (HCC) 2019   Hypertension    Ileostomy in place [facility])    Osteoporosis 2019   Pneumonia    hx of   rectal ca 2007   surg only     Surgical History:  Past Surgical History:  Procedure Laterality Date   ABDOMINAL HYSTERECTOMY     BREAST EXCISIONAL BIOPSY Bilateral    benign   CARDIOVERSION N/A 06/07/2022   Procedure: CARDIOVERSION;  Surgeon: Sande Rives, MD;  Location[facility] INVASIVE CV LAB;  Service: Cardiovascular;  Laterality: N/A;   COLON SURGERY  2007   PARATHYROIDECTOMY N/A 05/26/2021   Procedure: NECK EXPLORATION WITH RIGHT INFERIOR PARATHYROIDECTOMY;  Surgeon: Darnell Level, MD;  Location: [facility] ORS;  Service: General;  Laterality: N/A;   surgery for spin al stenosis      bone spur touching nerve per pt     (Not in a hospital admission)   Inpatient Medications:   lisinopril  10 mg Oral Daily    Allergies:  Allergies  Allergen Reactions   Actonel [  Risedronate] Other (See Comments)    Lowered BP   Buspar [Buspirone] Other (See Comments)    Increased risk for Afib   Sertraline Diarrhea    No family history on file.   Physical Exam: Vitals:   06/10/22 0645 06/10/22 0700 06/10/22 0715 06/10/22 0715  BP: (!) 154/81 (!) 142/82 (!) 158/80   Pulse: 81 82 80   Resp: 16 17 17    Temp:    97.7 F (36.5 C)  TempSrc:    Oral  SpO2: 100% 100% 99%     GEN- frail elderly female lying in bed in NAD, daughter at bedside Neuro: A&O x  3, flat affect HEENT: Normocephalic, atraumatic Lungs- Few crackles that clear with cough, Normal effort.  Heart- Regular rate and rhythm, No M/G/R.  GI- Soft, NT, ND.  Extremities- No clubbing, cyanosis, or edema   Radiology/Studies: DG Chest Portable 1 View  Result Date: 06/09/2022 CLINICAL DATA:  Bradycardia EXAM: PORTABLE CHEST 1 VIEW COMPARISON:  X-ray 05/09/2022. FINDINGS: Small bilateral pleural effusions with some adjacent opacities, similar to previous. Mild thickening along the minor fissure. Stable cardiopericardial silhouette. No edema or pneumothorax. Surgical clips in the upper thorax. Dense left-sided lung nodules consistent with old granulomatous disease. Degenerative changes of the spine. Overlapping cardiac leads and defibrillator pads. IMPRESSION: Stable x-ray. Small effusions and adjacent opacities. Chronic changes elsewhere Electronically Signed   By: Karen Kays M.D.   On: 06/09/2022 16:51   EP STUDY  Result Date: 06/07/2022 See surgical note for result.   EKG: 5/15 SB rate 43, marked 1AVB with variable PR, some element of Mobitz I vs blocked PAC's.  Intermittent Accelerated Junctional, narrow QRS at 92ms (personally reviewed)  5/15 Accelerated Junctional, narrow QRS, single sinus beat at end  TELEMETRY: Variable NSR with 1 AVB, variable PR, accelerated junctional   (personally reviewed)  DEVICE HISTORY:  None  AF Hx AF, dx April 2024, s/p DCCV 06/07/22 with NSR after 1 shock, then brady with intermittent Mobitz I & junctional tachycardia. Started on Eliquis, first dose 05/11/22.    Assessment/Plan:  Bradycardia with 1AVB, Intermittent Junctional & Mobitz I Pre-Syncope No AV nodal blocking agents.  On Eliquis since 05/11/22. Noted hgb drift from 9.7 to 6.6 since starting anticoagulation, concern anemia may be contributing more to her fatigue/lethargy rather than rhythm currently.  -hold PPM placement until medically more stable -hold anticoagulation for  now   Persistent Atrial Fibrillation  -hold anticoagulation, discussed risks of being off anticoagulation with patient/daughter but currently not a candidate with active bleeding / symptomatic anemia  -SCD's for DVT prophylaxis  -tele monitoring   Symptomatic Normocytic Anemia  Baseline Hgb prior to starting Eliquis 9.7, Hgb on 5/15 down to 6.6 (confirmed with repeat lab > 6.8), tx 1 unit PRBC am of 5/16.   -consult [facility] for medical care -trend CBC  -transfuse for Hgb <7% or concern of active bleeding -anticipate she will need GI evaluation     [facility] consulted for primary medicine 5/16, will assume primary role as of 5/17.  EP will follow.      For questions or updates, please contact [facility] Please consult [email or web address] for contact info under Cardiology/STEMI.  Signed, Canary Brim, MSN, APRN, NP-C, AGACNP-BC [facility] - Electrophysiology  06/10/2022, 8:43 AM

## 2022-06-10 NOTE — Consult Note (Signed)
[facility] Medical Consultation  ULDENE SERFASS BJY:[id number] DOB: Nov 13, 1932 DOA: 06/09/2022 PCP: Thana Ates, MD   Requesting physician: Dr.Croitoru Date of consultation: 06/10/2022 Reason for consultation: Symptomatic anemia  Impression/Recommendations Principal Problem:   AV block, 2nd degree Active Problems:   PAF (paroxysmal atrial fibrillation) (HCC)   Symptomatic anemia   GIB (gastrointestinal bleeding)   ANXIETY   Essential hypertension   Ileostomy in place (HCC)   Anemia   Allergic rhinitis   COLITIS, ULCERATIVE   Hyponatremia   Hypocalcemia    Symptomatic anemia/probable upper GI bleed with melanotic stools.       -Patient presenting with weakness and fatigue, initially felt likely cardiac etiology.  Hemoglobin noted initially on presentation of 7.9 will repeat later on at 6.6 and subsequently 6.8. -Patient noted on examination to have dark melanotic stools in ileostomy bag., patient noted to have elevated BUN 55. -Anemia panel with iron of 57, TIBC of 459, folate of 19.1, ferritin of 35. -Patient with prior history of rectal cancer status post ileostomy, history of ulcerative colitis. -Patient also noted to be on oral iron supplementation prior to admission. -Status post transfusion 1 unit packed red blood cells. -Check a CBC now, serial CBCs. -Place on a Protonix drip, IV fluids, keep n.p.o. except sips with water and ice chips. -Transfusion threshold hemoglobin < 7 -Hold Eliquis, GI to advise when Eliquis may be resumed. -Consult GI for further evaluation and management.  2.  Acute on chronic hyponatremia -Likely secondary to hypovolemic hyponatremia. -Patient noted to have a sodium level of 131 on 05/09/2022. -Check a UA, urine sodium, urine creatinine, TSH, random cortisol level, urine osmolality, serum osmolality. -Placed on IV fluids. -Repeat labs. -If worsening hyponatremia may need input from nephrology, follow for now.  3.   Hypertension -Continue home regimen lisinopril.  4.  Anxiety -Placed on as needed Ativan.  5.  Symptomatic bradycardia due to second-degree AV block Mobitz type I -Patient's status post cardioversion 06/07/2022. -Patient admitted to the cardiology service, EP consulted who are following. -Per cardiology and EP.  6.  Persistent atrial fibrillation -Status post cardioversion 06/07/2022. -Eliquis on hold due to concerns of acute GI bleed. -GI to advise when Eliquis may be resumed. -Per cardiology.  7.  History of rectal cancer status post ileostomy/history of ulcerative colitis -Outpatient follow-up with PCP. -GI consultation pending due to problem #1.  8.  History of hyperparathyroidism -Status post partial prior ectomy. -Outpatient follow-up.  9.  Aortic atherosclerosis/coronary calcifications -Per cardiology. I will followup again tomorrow. Please contact me if I can be of assistance in the meanwhile. Thank you for this consultation.  10.  Allergic rhinitis -Resume home regimen Claritin.  Chief Complaint: Weakness/fatigue  HPI:  Patient is a pleasant 89 year old female history of paroxysmal atrial fibrillation status post DC cardioversion 06/07/2022, hypertension on lisinopril, prior hyperparathyroidism status post right inferior thyroidectomy, GERD (patient denies), history of rectal cancer status post surgical resection end ileostomy since 2007, history of ulcerative colitis, recently diagnosed with A-fib based on reports from [employer] with some associated fatigue and shortness of breath.  Patient noted to have been started on anticoagulation with Eliquis underwent cardioversion successfully 06/07/2022.  Per cardiology note it was noted that patient did have some periods of second-degree AV block Mobitz type I immediately following cardioversion.  Post cardioversion patient presented back with extreme weakness, dizziness, presyncopal episodes and denied any overt bleeding.   Patient denies any fevers, no chills, no emesis, no constipation, no hematemesis denies  any melena, no overt bleeding.  Patient does endorse some nausea ongoing for for the past 4 to 5 weeks.  Patient also endorses some diarrhea going on for several weeks which is attributed somewhat to her anxiolytics and antidepressant medications.  Daughter states she is changed patient's ileostomy for the first time in a while and noticed some dark stools however unable to tell whether melanotic or tarry stools.  Patient does endorse some lightheadedness, decreased oral intake.  No syncopal episodes. Patient seen in the ED and admitted to cardiology due to concerns for rhythm issues.  Patient noted in the ED to have periods of one-to-one AV conduction with very long first-degree AV block, periods of second-degree AV block Mobitz type I with heart rates lows in the mid 40s as well as junctional rhythm and retrograde P waves at rates of about 60 bpm.  Per cardiology note episodes of junctional rhythm typically started after dropped beat at the end of a Wenckebach cycle and can be sustained for several seconds with consistent retrograde one-to-one conduction. Patient admitted by the cardiology team, was being worked up due to concerns for needing a possible PPM, EP consulted to assess patient.  Patient noted to have an anemia with a hemoglobin initially on presentation at 7.9 which dropped down to 6.6 with repeat at 6.8. -Patient transfused 1 unit packed red blood cells. -Hospitalist were called to consult on patient due to concerns for symptomatic anemia in addition to management of other chronic medical issues and take over care and resume primary attending on patient.  Review of Systems:  As stated above in HPI otherwise negative.  Past Medical History:  Diagnosis Date   Anemia    Anxiety    Bell's palsy    GERD (gastroesophageal reflux disease)    Hyperparathyroidism (HCC) 2019   Hypertension    Ileostomy in  place [facility])    Osteoporosis 2019   Pneumonia    hx of   rectal ca 2007   surg only   Past Surgical History:  Procedure Laterality Date   ABDOMINAL HYSTERECTOMY     BREAST EXCISIONAL BIOPSY Bilateral    benign   CARDIOVERSION N/A 06/07/2022   Procedure: CARDIOVERSION;  Surgeon: Sande Rives, MD;  Location: [facility] INVASIVE CV LAB;  Service: Cardiovascular;  Laterality: N/A;   COLON SURGERY  2007   PARATHYROIDECTOMY N/A 05/26/2021   Procedure: NECK EXPLORATION WITH RIGHT INFERIOR PARATHYROIDECTOMY;  Surgeon: Darnell Level, MD;  Location: [facility] ORS;  Service: General;  Laterality: N/A;   surgery for spin al stenosis      bone spur touching nerve per pt   Social History:  reports that she has never smoked. She has never used smokeless tobacco. She reports that she does not currently use alcohol. She reports that she does not use drugs.  Allergies  Allergen Reactions   Actonel [Risedronate] Other (See Comments)    Lowered BP   Buspar [Buspirone] Other (See Comments)    Increased risk for Afib   Sertraline Diarrhea   No family history on file.  Prior to Admission medications   Medication Sig Start Date End Date Taking? Authorizing Provider  acetaminophen (TYLENOL) 500 MG tablet Take 500-1,000 mg by mouth every 6 (six) hours as needed (pain.).   Yes [provider]  apixaban (ELIQUIS) 5 MG TABS tablet Take 1 tablet (5 mg total) by mouth 2 (two) times daily. 05/25/22 06/24/22 Yes Eustace Pen, PA-C  Cholecalciferol (VITAMIN D3 SUPER STRENGTH) 50 MCG [phone]  UT) TABS Take 2,000 Units by mouth 4 (four) times a week.   Yes [provider]  ferrous sulfate 325 (65 FE) MG tablet Take 325 mg by mouth daily at 12 noon.   Yes [provider]  lisinopril (ZESTRIL) 10 MG tablet Take 10 mg by mouth in the morning and at bedtime.   Yes [provider]  loperamide (IMODIUM) 2 MG capsule Take 2-4 mg by mouth 4 (four) times daily as needed for diarrhea or loose  stools.   Yes [provider]  loratadine (CLARITIN) 5 MG chewable tablet Chew 5 mg by mouth daily.   Yes [provider]  Magnesium 250 MG TABS Take 250 mg by mouth 3 (three) times a week.   Yes [provider]  Multiple Vitamins-Minerals (CENTRUM SILVER PO) Take 1 tablet by mouth daily.   Yes [provider]  ORAL ELECTROLYTES PO Take 1 tablet by mouth daily as needed (electrolytes replenishment).   Yes [provider]  ORAL ELECTROLYTES PO Take 500 mLs by mouth as needed (hydration). Nuun Sport Hydration Electrolyte Powder Drink Mix   Yes [provider]   Physical Exam: Blood pressure (!) 164/76, pulse (!) 48, temperature 97.7 F (36.5 C), temperature source Oral, resp. rate 18, SpO2 99 %. Vitals:   06/10/22 0815 06/10/22 0900  BP: (!) 157/79 (!) 164/76  Pulse: 81 (!) 48  Resp: 16 18  Temp:    SpO2: 100% 99%    General: NAD.  Dry mucous membranes. Eyes: PERRLA. EOMI ENT: Oropharynx is clear, no lesions, no exudate. Neck: Supple.  No lymphadenopathy. Cardiovascular: RRR no murmurs rubs or gallops.  No JVD.  No lower extremity edema. Respiratory: Clear to auscultation bilaterally.  No wheezes, no crackles, no rhonchi.  Fair air movement.  Speaking in full sentences. Abdomen: Soft, nontender, nondistended, positive bowel sounds.  No rebound.  No guarding.  Ileostomy bag in place with melanotic stool/dark tarry stools. Skin: No lesions, no rashes. Musculoskeletal: 4/5 BUE and BLE strength. Psychiatric: Judgment and insight normal.  Mood and affect appropriate. Neurologic: Alert and oriented.  Cranial nerves II through XII grossly intact.  No focal neurological deficits.  Moving extremities spontaneously.  Labs on Admission:  Basic Metabolic Panel: Recent Labs  Lab 06/09/22 1626 06/09/22 1638 06/09/22 2150  NA 128* 126*  --   K 4.6 5.0  --   CL 95* 99  --   CO2 23  --   --   GLUCOSE 103* 105*  --   BUN 44* 55*  --    CREATININE 0.83 0.90 0.83  CALCIUM 9.6  --   --   MG 2.2  --   --    Liver Function Tests: Recent Labs  Lab 06/09/22 1626  AST 41  ALT 41  ALKPHOS 82  BILITOT 0.4  PROT 7.0  ALBUMIN 3.6   No results for input(s): "LIPASE", "AMYLASE" in the last 168 hours. No results for input(s): "AMMONIA" in the last 168 hours. CBC: Recent Labs  Lab 06/09/22 1626 06/09/22 1638 06/09/22 2150 06/09/22 2330  WBC 5.7  --  4.2  --   NEUTROABS 4.0  --   --   --   HGB 7.9* 8.5* 6.6* 6.8*  HCT 25.4* 25.0* 20.6* 21.3*  MCV 88.8  --  85.5  --   PLT 211  --  189  --    Cardiac Enzymes: No results for input(s): "CKTOTAL", "CKMB", "CKMBINDEX", "TROPONINI" in the last 168 hours. BNP:  Invalid input(s): "POCBNP" CBG: No results for input(s): "GLUCAP" in the last 168 hours.  Radiological Exams on Admission: DG Chest Portable 1 View  Result Date: 06/09/2022 CLINICAL DATA:  Bradycardia EXAM: PORTABLE CHEST 1 VIEW COMPARISON:  X-ray 05/09/2022. FINDINGS: Small bilateral pleural effusions with some adjacent opacities, similar to previous. Mild thickening along the minor fissure. Stable cardiopericardial silhouette. No edema or pneumothorax. Surgical clips in the upper thorax. Dense left-sided lung nodules consistent with old granulomatous disease. Degenerative changes of the spine. Overlapping cardiac leads and defibrillator pads. IMPRESSION: Stable x-ray. Small effusions and adjacent opacities. Chronic changes elsewhere Electronically Signed   By: Karen Kays M.D.   On: 06/09/2022 16:51    EKG: Independently reviewed.  Initial EKG with normal sinus rhythm with first-degree AV block Mobitz type I with bradycardia at 43 bpm.  Some junctional beats noted.  Time spent: 65 minutes  Ramiro Harvest [facility] Pager [phone]  If 7PM-7AM, please contact night-coverage [email or web address] Password [facility] 06/10/2022, 10:26 AM

## 2022-06-10 NOTE — H&P (View-Only) (Signed)
 Referring Provider: Dr. Thompson, TRH Primary Care Physician:  Raju, Sneha P, MD Primary Gastroenterologist:  Dr. Kaplan   Reason for Consultation:  Anemia  HPI: Sandy Trujillo is a 89 y.o. female with history of paroxysmal atrial fibrillation status post DC cardioversion 06/07/2022, hypertension, prior hyperparathyroidism status post right inferior thyroidectomy, GERD, history of ulcerative colitis and rectal cancer status post surgical resection (colectomy) and end ileostomy since 2007, recently diagnosed with A-fib.  Patient noted to have been started on anticoagulation with Eliquis underwent cardioversion successfully 06/07/2022.  Per cardiology note it was noted that patient did have some periods of second-degree AV block Mobitz type I immediately following cardioversion.  Post cardioversion patient presented back with extreme weakness, dizziness, presyncopal episodes and denied any overt bleeding.  Patient does endorse some nausea ongoing for for the past 4 to 5 weeks that family thought was due to the atrial fibrillation and diet.  They say that she has a lot of anxiety and had some issues recently with a neighbor.  Patient does not know when the black stools started.  Says that she did not take much notice to them and thought it was just something that she was eating.  Hgb 6.8 grams, transfused a unit of PRBCs and Hgb increased to 8.4 grams.  BUN up at 55 yesterday.  Of note CT angio of the chest last month showed a patulous esophagus containing fluid and debris extending into the upper esophagus.   Past Medical History:  Diagnosis Date   Anemia    Anxiety    Bell's palsy    GERD (gastroesophageal reflux disease)    Hyperparathyroidism (HCC) 2019   Hypertension    Ileostomy in place (HCC)    Osteoporosis 2019   Pneumonia    hx of   rectal ca 2007   surg only    Past Surgical History:  Procedure Laterality Date   ABDOMINAL HYSTERECTOMY     BREAST EXCISIONAL BIOPSY  Bilateral    benign   CARDIOVERSION N/A 06/07/2022   Procedure: CARDIOVERSION;  Surgeon: O'Neal, [place] Thomas, MD;  Location: [facility] INVASIVE CV LAB;  Service: Cardiovascular;  Laterality: N/A;   COLON SURGERY  2007   PARATHYROIDECTOMY N/A 05/26/2021   Procedure: NECK EXPLORATION WITH RIGHT INFERIOR PARATHYROIDECTOMY;  Surgeon: Gerkin, Todd, MD;  Location: [facility] ORS;  Service: General;  Laterality: N/A;   surgery for spin al stenosis      bone spur touching nerve per pt    Prior to Admission medications   Medication Sig Start Date End Date Taking? Authorizing Provider  acetaminophen (TYLENOL) 500 MG tablet Take 500-1,000 mg by mouth every 6 (six) hours as needed (pain.).   Yes [provider]  apixaban (ELIQUIS) 5 MG TABS tablet Take 1 tablet (5 mg total) by mouth 2 (two) times daily. 05/25/22 06/24/22 Yes Suarez, Joseph J, PA-C  Cholecalciferol (VITAMIN D3 SUPER STRENGTH) 50 MCG (2000 UT) TABS Take 2,000 Units by mouth 4 (four) times a week.   Yes [provider]  ferrous sulfate 325 (65 FE) MG tablet Take 325 mg by mouth daily at 12 noon.   Yes [provider]  lisinopril (ZESTRIL) 10 MG tablet Take 10 mg by mouth in the morning and at bedtime.   Yes [provider]  loperamide (IMODIUM) 2 MG capsule Take 2-4 mg by mouth 4 (four) times daily as needed for diarrhea or loose stools.   Yes [provider]  loratadine (CLARITIN) 5 MG chewable tablet Chew   5 mg by mouth daily.   Yes [provider]  Magnesium 250 MG TABS Take 250 mg by mouth 3 (three) times a week.   Yes [provider]  Multiple Vitamins-Minerals (CENTRUM SILVER PO) Take 1 tablet by mouth daily.   Yes [provider]  ORAL ELECTROLYTES PO Take 1 tablet by mouth daily as needed (electrolytes replenishment).   Yes [provider]  ORAL ELECTROLYTES PO Take 500 mLs by mouth as needed (hydration). Nuun Sport Hydration Electrolyte Powder Drink Mix   Yes [provider]    Current Facility-Administered Medications  Medication Dose Route Frequency Provider Last Rate Last Admin   0.9 %  sodium chloride infusion   Intravenous Continuous Thompson, Daniel V, MD 100 mL/hr at 06/10/22 1109 New Bag at 06/10/22 1109   acetaminophen (TYLENOL) tablet 650 mg  650 mg Oral Q4H PRN Johnson, Kathleen R, PA-C       cholecalciferol (VITAMIN D3) 25 MCG (1000 UNIT) tablet 2,000 Units  2,000 Units Oral Once per day on Sun Tue Thu Sat Thompson, Daniel V, MD       lisinopril (ZESTRIL) tablet 10 mg  10 mg Oral Daily Johnson, Kathleen R, PA-C       loratadine (CLARITIN) tablet 5 mg  5 mg Oral Daily Thompson, Daniel V, MD       ondansetron (ZOFRAN) injection 4 mg  4 mg Intravenous Q6H PRN Johnson, Kathleen R, PA-C       [START ON 06/13/2022] pantoprazole (PROTONIX) injection 40 mg  40 mg Intravenous Q12H Thompson, Daniel V, MD       pantoprozole (PROTONIX) 80 mg /NS 100 mL infusion  8 mg/hr Intravenous Continuous Thompson, Daniel V, MD       Current Outpatient Medications  Medication Sig Dispense Refill   acetaminophen (TYLENOL) 500 MG tablet Take 500-1,000 mg by mouth every 6 (six) hours as needed (pain.).     apixaban (ELIQUIS) 5 MG TABS tablet Take 1 tablet (5 mg total) by mouth 2 (two) times daily. 60 tablet 4   Cholecalciferol (VITAMIN D3 SUPER STRENGTH) 50 MCG (2000 UT) TABS Take 2,000 Units by mouth 4 (four) times a week.     ferrous sulfate 325 (65 FE) MG tablet Take 325 mg by mouth daily at 12 noon.     lisinopril (ZESTRIL) 10 MG tablet Take 10 mg by mouth in the morning and at bedtime.     loperamide (IMODIUM) 2 MG capsule Take 2-4 mg by mouth 4 (four) times daily as needed for diarrhea or loose stools.     loratadine (CLARITIN) 5 MG chewable tablet Chew 5 mg by mouth daily.     Magnesium 250 MG TABS Take 250 mg by mouth 3 (three) times a week.     Multiple Vitamins-Minerals (CENTRUM SILVER PO) Take 1 tablet by mouth daily.     ORAL ELECTROLYTES PO Take 1  tablet by mouth daily as needed (electrolytes replenishment).     ORAL ELECTROLYTES PO Take 500 mLs by mouth as needed (hydration). Nuun Sport Hydration Electrolyte Powder Drink Mix      Allergies as of 06/09/2022 - Review Complete 06/09/2022  Allergen Reaction Noted   Actonel [risedronate] Other (See Comments) 03/24/2020   Buspar [buspirone] Other (See Comments) 05/09/2022   Sertraline Diarrhea 05/09/2022    No family history on file.  Social History   Socioeconomic History   Marital status: Widowed    Spouse name: Not on file   Number of children: Not   on file   Years of education: Not on file   Highest education level: Not on file  Occupational History   Not on file  Tobacco Use   Smoking status: Never   Smokeless tobacco: Never   Tobacco comments:    Never smoke 05/25/22  Vaping Use   Vaping Use: Never used  Substance and Sexual Activity   Alcohol use: Not Currently   Drug use: Never   Sexual activity: Not on file  Other Topics Concern   Not on file  Social History Narrative   Not on file   Social Determinants of Health   Financial Resource Strain: Not on file  Food Insecurity: No Food Insecurity (06/10/2022)   Hunger Vital Sign    Worried About Running Out of Food in the Last Year: Never true    Ran Out of Food in the Last Year: Never true  Transportation Needs: No Transportation Needs (06/10/2022)   PRAPARE - Transportation    Lack of Transportation (Medical): No    Lack of Transportation (Non-Medical): No  Physical Activity: Not on file  Stress: Not on file  Social Connections: Not on file  Intimate Partner Violence: Not At Risk (06/10/2022)   Humiliation, Afraid, Rape, and Kick questionnaire    Fear of Current or Ex-Partner: No    Emotionally Abused: No    Physically Abused: No    Sexually Abused: No    Review of Systems: ROS is O/W negative except as mentioned in HPI.  Physical Exam: Vital signs in last 24 hours: Temp:  [97.4 F (36.3 C)-97.7 F  (36.5 C)] 97.6 F (36.4 C) (05/16 1202) Pulse Rate:  [25-146] 53 (05/16 1200) Resp:  [14-20] 17 (05/16 1200) BP: (83-180)/(47-106) 146/74 (05/16 1200) SpO2:  [97 %-100 %] 99 % (05/16 1200) Weight:  [59.9 kg] 59.9 kg (05/15 1353)   General:  Alert, Well-developed, well-nourished, pleasant and cooperative in NAD Head:  Normocephalic and atraumatic. Eyes:  Sclera clear, no icterus.  Conjunctiva pink. Ears:  Normal auditory acuity. Mouth:  No deformity or lesions.   Lungs:  Clear throughout to auscultation.  No wheezes, crackles, or rhonchi.  Heart:  Irregular; no murmurs, clicks, rubs, or gallops. Abdomen:  Soft, non-distended.  BS present.  Non-tender.  Ostomy in place on the right abdomen with black stool noted.   Msk:  Symmetrical without gross deformities. Pulses:  Normal pulses noted. Extremities:  Without clubbing or edema. Neurologic:  Alert and oriented x 4;  grossly normal neurologically. Skin:  Intact without significant lesions or rashes. Psych:  Alert and cooperative. Normal mood and affect.  Lab Results: Recent Labs    06/09/22 1626 06/09/22 1638 06/09/22 2150 06/09/22 2330 06/10/22 1036  WBC 5.7  --  4.2  --  5.8  HGB 7.9*   < > 6.6* 6.8* 8.4*  HCT 25.4*   < > 20.6* 21.3* 26.7*  PLT 211  --  189  --  192   < > = values in this interval not displayed.   BMET Recent Labs    06/09/22 1626 06/09/22 1638 06/09/22 2150 06/10/22 1036  NA 128* 126*  --  132*  K 4.6 5.0  --  4.6  CL 95* 99  --  99  CO2 23  --   --  23  GLUCOSE 103* 105*  --  80  BUN 44* 55*  --  37*  CREATININE 0.83 0.90 0.83 0.77  CALCIUM 9.6  --   --  9.0     LFT Recent Labs    06/09/22 1626  PROT 7.0  ALBUMIN 3.6  AST 41  ALT 41  ALKPHOS 82  BILITOT 0.4   PT/INR Recent Labs    06/09/22 1626  LABPROT 16.1*  INR 1.3*   Studies/Results: DG Chest Portable 1 View  Result Date: 06/09/2022 CLINICAL DATA:  Bradycardia EXAM: PORTABLE CHEST 1 VIEW COMPARISON:  X-ray 05/09/2022.  FINDINGS: Small bilateral pleural effusions with some adjacent opacities, similar to previous. Mild thickening along the minor fissure. Stable cardiopericardial silhouette. No edema or pneumothorax. Surgical clips in the upper thorax. Dense left-sided lung nodules consistent with old granulomatous disease. Degenerative changes of the spine. Overlapping cardiac leads and defibrillator pads. IMPRESSION: Stable x-ray. Small effusions and adjacent opacities. Chronic changes elsewhere Electronically Signed   By: Ashok  Gupta M.D.   On: 06/09/2022 16:51    IMPRESSION:  *Symptomatic anemia/probable upper GI bleed with melenic stool in ostomy:  Hgb 6.8 grams, transfused a unit of PRBCs and Hgb increased to 8.4 grams.  BUN up at 55 yesterday. *Remote history of Ulcerative colitis and rectal cancer:  S/p colectomy with Ileostomy in place.  Black stool in ostomy bag. *Atrial fibrillation on Eliquis:  Eliquis on hold.  PLAN: -Pantoprazole 40 mg IV twice daily is in place.  Continue. -Monitor hemoglobin and transfuse further if needed. -EGD on 5/17. -Ok for clear liquids for now then NPO after midnight.  Jessica D. Zehr  06/10/2022, 12:44 PM   I have taken an interval history, thoroughly reviewed the chart and examined the patient. I agree with the Advanced Practitioner's note, impression and recommendations, and have recorded additional findings, impressions and recommendations below. I performed a substantive portion of this encounter (>50% time spent), including a complete performance of the medical decision making.  My additional thoughts are as follows:  Recent subacute GI bleeding, worsened in recent days now with marked anemia of blood loss.  Overall clinical picture favors an upper GI source. Exacerbated by oral anticoagulation with last dose yesterday.  Recent DCCV for A-fib.  Transfused up to hemoglobin of 8.4 today, elevated BUN.  Hemodynamically stable and looks well on exam.  Plan is for  upper endoscopy tomorrow, and she was agreeable after thorough discussion of procedure and risks.  The benefits and risks of the planned procedure were described in detail with the patient or (when appropriate) their health care proxy.  Risks were outlined as including, but not limited to, bleeding, infection, perforation, adverse medication reaction leading to cardiac or pulmonary decompensation, pancreatitis (if ERCP).  The limitation of incomplete mucosal visualization was also discussed.  No guarantees or warranties were given.  Patient at increased risk for cardiopulmonary complications of procedure due to medical comorbidities.  Her daughter was at the bedside for my entire visit and all their questions were answered.  Meanwhile, clear liquids until midnight, twice daily PPI and hold OAC  Please check CBC tonight and tomorrow morning   Lativia Velie L Danis III Office:[phone]    

## 2022-06-10 NOTE — Care Management (Addendum)
  Transition of Care [facility]) Screening Note   Patient Details  Name: Sandy Trujillo Date of Birth: 1932/11/16   Transition of Care [facility]) CM/SW Contact:    Lockie Pares, RN Phone Number: 06/10/2022, 2:22 PM    Transition of Care Department [facility]) has reviewed patient and no TOC needs have been identified at this time. We will continue to monitor patient advancement through interdisciplinary progression rounds. If new patient transition needs arise, please place a TOC consult.

## 2022-06-10 NOTE — ED Notes (Signed)
Patient reports no current pain.  No bleeding that she is aware of.  States she does take Iron, but has not had any today.  Will continue to monitor

## 2022-06-10 NOTE — Progress Notes (Addendum)
Referring Provider: Dr. Janee Morn, [facility] Primary Care Physician:  Thana Ates, MD Primary Gastroenterologist:  Dr. Arlyce Dice   Reason for Consultation:  Anemia  HPI: Sandy Trujillo is a 89 y.o. female with history of paroxysmal atrial fibrillation status post DC cardioversion 06/07/2022, hypertension, prior hyperparathyroidism status post right inferior thyroidectomy, GERD, history of ulcerative colitis and rectal cancer status post surgical resection (colectomy) and end ileostomy since 2007, recently diagnosed with A-fib.  Patient noted to have been started on anticoagulation with Eliquis underwent cardioversion successfully 06/07/2022.  Per cardiology note it was noted that patient did have some periods of second-degree AV block Mobitz type I immediately following cardioversion.  Post cardioversion patient presented back with extreme weakness, dizziness, presyncopal episodes and denied any overt bleeding.  Patient does endorse some nausea ongoing for for the past 4 to 5 weeks that family thought was due to the atrial fibrillation and diet.  They say that she has a lot of anxiety and had some issues recently with a neighbor.  Patient does not know when the black stools started.  Says that she did not take much notice to them and thought it was just something that she was eating.  Hgb 6.8 grams, transfused a unit of PRBCs and Hgb increased to 8.4 grams.  BUN up at 55 yesterday.  Of note CT angio of the chest last month showed a patulous esophagus containing fluid and debris extending into the upper esophagus.   Past Medical History:  Diagnosis Date   Anemia    Anxiety    Bell's palsy    GERD (gastroesophageal reflux disease)    Hyperparathyroidism (HCC) 2019   Hypertension    Ileostomy in place [facility])    Osteoporosis 2019   Pneumonia    hx of   rectal ca 2007   surg only    Past Surgical History:  Procedure Laterality Date   ABDOMINAL HYSTERECTOMY     BREAST EXCISIONAL BIOPSY  Bilateral    benign   CARDIOVERSION N/A 06/07/2022   Procedure: CARDIOVERSION;  Surgeon: Sande Rives, MD;  Location: [facility] INVASIVE CV LAB;  Service: Cardiovascular;  Laterality: N/A;   COLON SURGERY  2007   PARATHYROIDECTOMY N/A 05/26/2021   Procedure: NECK EXPLORATION WITH RIGHT INFERIOR PARATHYROIDECTOMY;  Surgeon: Darnell Level, MD;  Location: [facility] ORS;  Service: General;  Laterality: N/A;   surgery for spin al stenosis      bone spur touching nerve per pt    Prior to Admission medications   Medication Sig Start Date End Date Taking? Authorizing Provider  acetaminophen (TYLENOL) 500 MG tablet Take 500-1,000 mg by mouth every 6 (six) hours as needed (pain.).   Yes [provider]  apixaban (ELIQUIS) 5 MG TABS tablet Take 1 tablet (5 mg total) by mouth 2 (two) times daily. 05/25/22 06/24/22 Yes Eustace Pen, PA-C  Cholecalciferol (VITAMIN D3 SUPER STRENGTH) 50 MCG (2000 UT) TABS Take 2,000 Units by mouth 4 (four) times a week.   Yes [provider]  ferrous sulfate 325 (65 FE) MG tablet Take 325 mg by mouth daily at 12 noon.   Yes [provider]  lisinopril (ZESTRIL) 10 MG tablet Take 10 mg by mouth in the morning and at bedtime.   Yes [provider]  loperamide (IMODIUM) 2 MG capsule Take 2-4 mg by mouth 4 (four) times daily as needed for diarrhea or loose stools.   Yes [provider]  loratadine (CLARITIN) 5 MG chewable tablet Chew  5 mg by mouth daily.   Yes [provider]  Magnesium 250 MG TABS Take 250 mg by mouth 3 (three) times a week.   Yes [provider]  Multiple Vitamins-Minerals (CENTRUM SILVER PO) Take 1 tablet by mouth daily.   Yes [provider]  ORAL ELECTROLYTES PO Take 1 tablet by mouth daily as needed (electrolytes replenishment).   Yes [provider]  ORAL ELECTROLYTES PO Take 500 mLs by mouth as needed (hydration). Nuun Sport Hydration Electrolyte Powder Drink Mix   Yes [provider]    Current Facility-Administered Medications  Medication Dose Route Frequency Provider Last Rate Last Admin   0.9 %  sodium chloride infusion   Intravenous Continuous Rodolph Bong, MD 100 mL/hr at 06/10/22 1109 New Bag at 06/10/22 1109   acetaminophen (TYLENOL) tablet 650 mg  650 mg Oral Q4H PRN Jonita Albee, PA-C       cholecalciferol (VITAMIN D3) 25 MCG (1000 UNIT) tablet 2,000 Units  2,000 Units Oral Once per day on Sun Tue Thu Sat Rodolph Bong, MD       lisinopril (ZESTRIL) tablet 10 mg  10 mg Oral Daily Jonita Albee, PA-C       loratadine (CLARITIN) tablet 5 mg  5 mg Oral Daily Rodolph Bong, MD       ondansetron [facility]) injection 4 mg  4 mg Intravenous Q6H PRN Jonita Albee, [place]       [START ON 06/13/2022] pantoprazole (PROTONIX) injection 40 mg  40 mg Intravenous Q12H Rodolph Bong, MD       pantoprozole (PROTONIX) 80 mg /NS 100 mL infusion  8 mg/hr Intravenous Continuous Rodolph Bong, MD       Current Outpatient Medications  Medication Sig Dispense Refill   acetaminophen (TYLENOL) 500 MG tablet Take 500-1,000 mg by mouth every 6 (six) hours as needed (pain.).     apixaban (ELIQUIS) 5 MG TABS tablet Take 1 tablet (5 mg total) by mouth 2 (two) times daily. 60 tablet 4   Cholecalciferol (VITAMIN D3 SUPER STRENGTH) 50 MCG (2000 UT) TABS Take 2,000 Units by mouth 4 (four) times a week.     ferrous sulfate 325 (65 FE) MG tablet Take 325 mg by mouth daily at 12 noon.     lisinopril (ZESTRIL) 10 MG tablet Take 10 mg by mouth in the morning and at bedtime.     loperamide (IMODIUM) 2 MG capsule Take 2-4 mg by mouth 4 (four) times daily as needed for diarrhea or loose stools.     loratadine (CLARITIN) 5 MG chewable tablet Chew 5 mg by mouth daily.     Magnesium 250 MG TABS Take 250 mg by mouth 3 (three) times a week.     Multiple Vitamins-Minerals (CENTRUM SILVER PO) Take 1 tablet by mouth daily.     ORAL ELECTROLYTES PO Take 1  tablet by mouth daily as needed (electrolytes replenishment).     ORAL ELECTROLYTES PO Take 500 mLs by mouth as needed (hydration). Nuun Sport Hydration Electrolyte Powder Drink Mix      Allergies as of 06/09/2022 - Review Complete 06/09/2022  Allergen Reaction Noted   Actonel [risedronate] Other (See Comments) 03/24/2020   Buspar [buspirone] Other (See Comments) 05/09/2022   Sertraline Diarrhea 05/09/2022    No family history on file.  Social History   Socioeconomic History   Marital status: Widowed    Spouse name: Not on file   Number of children: Not  on file   Years of education: Not on file   Highest education level: Not on file  Occupational History   Not on file  Tobacco Use   Smoking status: Never   Smokeless tobacco: Never   Tobacco comments:    Never smoke 05/25/22  Vaping Use   Vaping Use: Never used  Substance and Sexual Activity   Alcohol use: Not Currently   Drug use: Never   Sexual activity: Not on file  Other Topics Concern   Not on file  Social History Narrative   Not on file   Social Determinants of Health   Financial Resource Strain: Not on file  Food Insecurity: No Food Insecurity (06/10/2022)   Hunger Vital Sign    Worried About Running Out of Food in the Last Year: Never true    Ran Out of Food in the Last Year: Never true  Transportation Needs: No Transportation Needs (06/10/2022)   PRAPARE - Administrator, Civil Service (Medical): No    Lack of Transportation (Non-Medical): No  Physical Activity: Not on file  Stress: Not on file  Social Connections: Not on file  Intimate Partner Violence: Not At Risk (06/10/2022)   Humiliation, Afraid, Rape, and Kick questionnaire    Fear of Current or Ex-Partner: No    Emotionally Abused: No    Physically Abused: No    Sexually Abused: No    Review of Systems: ROS is O/W negative except as mentioned in HPI.  Physical Exam: Vital signs in last 24 hours: Temp:  [97.4 F (36.3 C)-97.7 F  (36.5 C)] 97.6 F (36.4 C) (05/16 1202) Pulse Rate:  [25-146] 53 (05/16 1200) Resp:  [14-20] 17 (05/16 1200) BP: (83-180)/(47-106) 146/74 (05/16 1200) SpO2:  [97 %-100 %] 99 % (05/16 1200) Weight:  [59.9 kg] 59.9 kg (05/15 1353)   General:  Alert, Well-developed, well-nourished, pleasant and cooperative in NAD Head:  Normocephalic and atraumatic. Eyes:  Sclera clear, no icterus.  Conjunctiva pink. Ears:  Normal auditory acuity. Mouth:  No deformity or lesions.   Lungs:  Clear throughout to auscultation.  No wheezes, crackles, or rhonchi.  Heart:  Irregular; no murmurs, clicks, rubs, or gallops. Abdomen:  Soft, non-distended.  BS present.  Non-tender.  Ostomy in place on the right abdomen with black stool noted.   Msk:  Symmetrical without gross deformities. Pulses:  Normal pulses noted. Extremities:  Without clubbing or edema. Neurologic:  Alert and oriented x 4;  grossly normal neurologically. Skin:  Intact without significant lesions or rashes. Psych:  Alert and cooperative. Normal mood and affect.  Lab Results: Recent Labs    06/09/22 1626 06/09/22 1638 06/09/22 2150 06/09/22 2330 06/10/22 1036  WBC 5.7  --  4.2  --  5.8  HGB 7.9*   < > 6.6* 6.8* 8.4*  HCT 25.4*   < > 20.6* 21.3* 26.7*  PLT 211  --  189  --  192   < > = values in this interval not displayed.   BMET Recent Labs    06/09/22 1626 06/09/22 1638 06/09/22 2150 06/10/22 1036  NA 128* 126*  --  132*  K 4.6 5.0  --  4.6  CL 95* 99  --  99  CO2 23  --   --  23  GLUCOSE 103* 105*  --  80  BUN 44* 55*  --  37*  CREATININE 0.83 0.90 0.83 0.77  CALCIUM 9.6  --   --  9.0  LFT Recent Labs    06/09/22 1626  PROT 7.0  ALBUMIN 3.6  AST 41  ALT 41  ALKPHOS 82  BILITOT 0.4   PT/INR Recent Labs    06/09/22 1626  LABPROT 16.1*  INR 1.3*   Studies/Results: DG Chest Portable 1 View  Result Date: 06/09/2022 CLINICAL DATA:  Bradycardia EXAM: PORTABLE CHEST 1 VIEW COMPARISON:  X-ray 05/09/2022.  FINDINGS: Small bilateral pleural effusions with some adjacent opacities, similar to previous. Mild thickening along the minor fissure. Stable cardiopericardial silhouette. No edema or pneumothorax. Surgical clips in the upper thorax. Dense left-sided lung nodules consistent with old granulomatous disease. Degenerative changes of the spine. Overlapping cardiac leads and defibrillator pads. IMPRESSION: Stable x-ray. Small effusions and adjacent opacities. Chronic changes elsewhere Electronically Signed   By: Karen Kays M.D.   On: 06/09/2022 16:51    IMPRESSION:  *Symptomatic anemia/probable upper GI bleed with melenic stool in ostomy:  Hgb 6.8 grams, transfused a unit of PRBCs and Hgb increased to 8.4 grams.  BUN up at 55 yesterday. *Remote history of Ulcerative colitis and rectal cancer:  S/p colectomy with Ileostomy in place.  Black stool in ostomy bag. *Atrial fibrillation on Eliquis:  Eliquis on hold.  PLAN: -Pantoprazole 40 mg IV twice daily is in place.  Continue. -Monitor hemoglobin and transfuse further if needed. -EGD on 5/17. -Ok for clear liquids for now then NPO after midnight.  Princella Pellegrini. Zehr  06/10/2022, 12:44 PM   I have taken an interval history, thoroughly reviewed the chart and examined the patient. I agree with the Advanced Practitioner's note, impression and recommendations, and have recorded additional findings, impressions and recommendations below. I performed a substantive portion of this encounter (>50% time spent), including a complete performance of the medical decision making.  My additional thoughts are as follows:  Recent subacute GI bleeding, worsened in recent days now with marked anemia of blood loss.  Overall clinical picture favors an upper GI source. Exacerbated by oral anticoagulation with last dose yesterday.  Recent DCCV for A-fib.  Transfused up to hemoglobin of 8.4 today, elevated BUN.  Hemodynamically stable and looks well on exam.  Plan is for  upper endoscopy tomorrow, and she was agreeable after thorough discussion of procedure and risks.  The benefits and risks of the planned procedure were described in detail with the patient or (when appropriate) their health care proxy.  Risks were outlined as including, but not limited to, bleeding, infection, perforation, adverse medication reaction leading to cardiac or pulmonary decompensation, pancreatitis (if ERCP).  The limitation of incomplete mucosal visualization was also discussed.  No guarantees or warranties were given.  Patient at increased risk for cardiopulmonary complications of procedure due to medical comorbidities.  Her daughter was at the bedside for my entire visit and all their questions were answered.  Meanwhile, clear liquids until midnight, twice daily PPI and hold OAC  Please check CBC tonight and tomorrow morning   Charlie Pitter III [phone]

## 2022-06-10 NOTE — Consult Note (Signed)
ERROR

## 2022-06-11 ENCOUNTER — Encounter (HOSPITAL_COMMUNITY)
Admission: EM | Disposition: A | Discharge status: Home / Self Care | Payer: Self-pay | Source: Home / Self Care | Attending: Internal Medicine

## 2022-06-11 ENCOUNTER — Inpatient Hospital Stay (HOSPITAL_COMMUNITY): Payer: Medicare Other | Admitting: Anesthesiology

## 2022-06-11 ENCOUNTER — Encounter (HOSPITAL_COMMUNITY): Payer: Self-pay | Admitting: Cardiovascular Disease

## 2022-06-11 DIAGNOSIS — K31811 Angiodysplasia of stomach and duodenum with bleeding: Secondary | ICD-10-CM

## 2022-06-11 DIAGNOSIS — D649 Anemia, unspecified: Secondary | ICD-10-CM | POA: Diagnosis not present

## 2022-06-11 DIAGNOSIS — I4891 Unspecified atrial fibrillation: Secondary | ICD-10-CM

## 2022-06-11 DIAGNOSIS — D62 Acute posthemorrhagic anemia: Secondary | ICD-10-CM

## 2022-06-11 DIAGNOSIS — I441 Atrioventricular block, second degree: Secondary | ICD-10-CM | POA: Diagnosis not present

## 2022-06-11 DIAGNOSIS — K921 Melena: Secondary | ICD-10-CM

## 2022-06-11 DIAGNOSIS — F419 Anxiety disorder, unspecified: Secondary | ICD-10-CM

## 2022-06-11 DIAGNOSIS — I10 Essential (primary) hypertension: Secondary | ICD-10-CM | POA: Diagnosis not present

## 2022-06-11 DIAGNOSIS — K5521 Angiodysplasia of colon with hemorrhage: Secondary | ICD-10-CM

## 2022-06-11 HISTORY — PX: HEMOSTASIS CLIP PLACEMENT: SHX6857

## 2022-06-11 HISTORY — PX: ESOPHAGOGASTRODUODENOSCOPY (EGD) WITH PROPOFOL: SHX5813

## 2022-06-11 HISTORY — PX: HOT HEMOSTASIS: SHX5433

## 2022-06-11 LAB — CBC WITH DIFFERENTIAL/PLATELET
Abs Immature Granulocytes: 0.04 10*3/uL (ref 0.00–0.07)
Basophils Absolute: 0 10*3/uL (ref 0.0–0.1)
Basophils Relative: 1 %
Eosinophils Absolute: 0.2 10*3/uL (ref 0.0–0.5)
Eosinophils Relative: 3 %
HCT: 25.1 % — ABNORMAL LOW (ref 36.0–46.0)
Hemoglobin: 7.9 g/dL — ABNORMAL LOW (ref 12.0–15.0)
Immature Granulocytes: 1 %
Lymphocytes Relative: 18 %
Lymphs Abs: 1 10*3/uL (ref 0.7–4.0)
MCH: 26.9 pg (ref 26.0–34.0)
MCHC: 31.5 g/dL (ref 30.0–36.0)
MCV: 85.4 fL (ref 80.0–100.0)
Monocytes Absolute: 0.5 10*3/uL (ref 0.1–1.0)
Monocytes Relative: 8 %
Neutro Abs: 3.9 10*3/uL (ref 1.7–7.7)
Neutrophils Relative %: 69 %
Platelets: 182 10*3/uL (ref 150–400)
RBC: 2.94 MIL/uL — ABNORMAL LOW (ref 3.87–5.11)
RDW: 17.5 % — ABNORMAL HIGH (ref 11.5–15.5)
WBC: 5.6 10*3/uL (ref 4.0–10.5)
nRBC: 0 % (ref 0.0–0.2)

## 2022-06-11 LAB — URINE CULTURE: Culture: <10000 — AB

## 2022-06-11 LAB — COMPREHENSIVE METABOLIC PANEL
ALT: 35 U/L (ref 0–44)
AST: 36 U/L (ref 15–41)
Albumin: 3.1 g/dL — ABNORMAL LOW (ref 3.5–5.0)
Alkaline Phosphatase: 67 U/L (ref 38–126)
Anion gap: 9 (ref 5–15)
BUN: 30 mg/dL — ABNORMAL HIGH (ref 8–23)
CO2: 22 mmol/L (ref 22–32)
Calcium: 8.5 mg/dL — ABNORMAL LOW (ref 8.9–10.3)
Chloride: 100 mmol/L (ref 98–111)
Creatinine, Ser: 0.77 mg/dL (ref 0.44–1.00)
GFR, Estimated: >60 mL/min (ref >60)
Glucose, Bld: 62 mg/dL — ABNORMAL LOW (ref 70–99)
Potassium: 4.2 mmol/L (ref 3.5–5.1)
Sodium: 131 mmol/L — ABNORMAL LOW (ref 135–145)
Total Bilirubin: 1.2 mg/dL (ref 0.3–1.2)
Total Protein: 6.2 g/dL — ABNORMAL LOW (ref 6.5–8.1)

## 2022-06-11 LAB — TYPE AND SCREEN: ABO/RH(D): A POS

## 2022-06-11 LAB — BPAM RBC
Blood Product Expiration Date: 202405172359
ISSUE DATE / TIME: 202405160425

## 2022-06-11 LAB — MAGNESIUM: Magnesium: 1.7 mg/dL (ref 1.7–2.4)

## 2022-06-11 SURGERY — ESOPHAGOGASTRODUODENOSCOPY (EGD) WITH PROPOFOL
Anesthesia: Monitor Anesthesia Care

## 2022-06-11 MED ORDER — SODIUM CHLORIDE 0.9 % IV SOLN: INTRAVENOUS | Status: DC

## 2022-06-11 MED ORDER — GLUCAGON HCL RDNA (DIAGNOSTIC) 1 MG IJ SOLR
INTRAMUSCULAR | Status: AC
  Filled 2022-06-11: qty 1

## 2022-06-11 MED ORDER — HYDROXYZINE HCL 10 MG PO TABS
10.0000 mg | ORAL_TABLET | Freq: Three times a day (TID) | ORAL | Status: DC | PRN
  Administered 2022-06-11 – 2022-06-12 (×2): 10 mg via ORAL
  Filled 2022-06-11 (×3): qty 1

## 2022-06-11 MED ORDER — PROPOFOL 500 MG/50ML IV EMUL
INTRAVENOUS | Status: DC | PRN
  Administered 2022-06-11: 100 ug/kg/min via INTRAVENOUS

## 2022-06-11 MED ORDER — GLUCAGON HCL RDNA (DIAGNOSTIC) 1 MG IJ SOLR
INTRAMUSCULAR | Status: DC | PRN
  Administered 2022-06-11: .5 mg via INTRAVENOUS

## 2022-06-11 MED ORDER — PROPOFOL 10 MG/ML IV BOLUS
INTRAVENOUS | Status: DC | PRN
  Administered 2022-06-11 (×2): 10 mg via INTRAVENOUS

## 2022-06-11 MED ORDER — IPRATROPIUM-ALBUTEROL 0.5-2.5 (3) MG/3ML IN SOLN: 3.0000 mL | RESPIRATORY_TRACT | Status: DC | PRN

## 2022-06-11 MED ORDER — SODIUM CHLORIDE 0.9 % IV SOLN
250.0000 mg | Freq: Once | INTRAVENOUS | Status: AC
  Administered 2022-06-11: 250 mg via INTRAVENOUS
  Filled 2022-06-11: qty 20

## 2022-06-11 SURGICAL SUPPLY — 15 items

## 2022-06-11 NOTE — Progress Notes (Signed)
Approximately 0745--Pt left floor for EGD. Pt's daughter at bedside. RN to assess pt and administer meds as ordered upon return to floor. Report provided to EGD by previous Nightshift RN. Telemetry notified.

## 2022-06-11 NOTE — Op Note (Addendum)
[facility] Patient Name: Sandy Trujillo Procedure Date : 06/11/2022 MRN: [id number] Attending MD: Starr Lake. Myrtie Neither , MD, [id number] Date of Birth: [birth year] CSN: [id number] Age: 89 Admit Type: Inpatient Procedure:                Upper GI endoscopy Indications:              Acute post hemorrhagic anemia, Iron deficiency                            anemia secondary to chronic blood loss, Melena Providers:                Sherilyn Cooter L. Myrtie Neither, MD, Lorenza Evangelist, RN, Priscella Mann, Technician Referring MD:             Thurmon Fair, MD and [facility] hospitalist service Medicines:                Monitored Anesthesia Care, also glucagon 0.5 mg IV Complications:            No immediate complications. Estimated Blood Loss:     Estimated blood loss was minimal. Procedure:                Pre-Anesthesia Assessment:                           - Prior to the procedure, a History and Physical                            was performed, and patient medications and                            allergies were reviewed. The patient's tolerance of                            previous anesthesia was also reviewed. The risks                            and benefits of the procedure and the sedation                            options and risks were discussed with the patient.                            All questions were answered, and informed consent                            was obtained. Prior Anticoagulants: The patient has                            taken Eliquis (apixaban), last dose was 2 days                            prior to procedure. ASA Grade Assessment: III - A  patient with severe systemic disease. After                            reviewing the risks and benefits, the patient was                            deemed in satisfactory condition to undergo the                            procedure.                           After obtaining  informed consent, the endoscope was                            passed under direct vision. Throughout the                            procedure, the patient's blood pressure, pulse, and                            oxygen saturations were monitored continuously. The                            [id number]-H190 ([id number]) Olympus endoscope was introduced                            through the mouth, and advanced to the second part                            of duodenum. The upper GI endoscopy was                            accomplished without difficulty. The patient                            tolerated the procedure well. Scope In: Scope Out: Findings:      The larynx was normal.      The esophagus was normal except for a mild Schatzki ring.      The stomach was normal except for some black bilious fluid.      The cardia and gastric fundus were normal on retroflexion.      Red blood was found in the second portion of the duodenum. IV glucagon       was administered to decrease intestinal motility. With that and lavage,       the bleeding site was identified.      A single diminutive angioectasia with bleeding was found in the second       portion of the duodenum. Coagulation for hemostasis using argon plasma       at 0.5 liters/minute and 20 watts was successful. To prevent bleeding       post-intervention, three hemostatic clips were successfully placed (MR       conditional). Clip manufacturer: [employer]. There was no       bleeding at the end of the procedure.  The exam of the duodenum was otherwise normal. Impression:               - Normal larynx.                           - Normal esophagus except for mild Schatzki ring.                           - Normal stomach.                           - Blood in the second portion of the duodenum.                           - A single bleeding angioectasia in the duodenum.                            Treated with argon plasma coagulation (APC).  Clips                            (MR conditional) were placed. Clip manufacturer:                            [employer].                           - No specimens collected. Recommendation:           - Return patient to hospital ward for ongoing care.                           - Resume regular diet.                           - This patient would benefit from intravenous iron                            prior to discharge. She will also then need oral                            iron post discharge.                           After discharge, hemoglobin should be checked a                            week later by primary care or cardiology.                           If OAC needs to be resumed after recent DCCV for                            A-fib, it can be resumed 3 days from now at regular  dose.                           Pantoprazole 40 mg once daily for 14 days. Procedure Code(s):        --- Professional ---                           [phone], Esophagogastroduodenoscopy, flexible,                            transoral; with control of bleeding, any method Diagnosis Code(s):        --- Professional ---                           K92.2, Gastrointestinal hemorrhage, unspecified                           K31.811, Angiodysplasia of stomach and duodenum                            with bleeding                           D62, Acute posthemorrhagic anemia                           D50.0, Iron deficiency anemia secondary to blood                            loss (chronic)                           K92.1, Melena (includes Hematochezia) CPT copyright 2022 American Medical Association. All rights reserved. The codes documented in this report are preliminary and upon coder review may  be revised to meet current compliance requirements. Shirin Echeverry L. Myrtie Neither, MD 06/11/2022 9:28:21 AM This report has been signed electronically. Number of Addenda: 0

## 2022-06-11 NOTE — Interval H&P Note (Signed)
History and Physical Interval Note:  06/11/2022 8:38 AM  Sandy Trujillo  has presented today for surgery, with the diagnosis of Anemia and melena.  The various methods of treatment have been discussed with the patient and family. After consideration of risks, benefits and other options for treatment, the patient has consented to  Procedure(s): ESOPHAGOGASTRODUODENOSCOPY (EGD) WITH PROPOFOL (N/A) as a surgical intervention.  The patient's history has been reviewed, patient examined, no change in status, stable for surgery.  I have reviewed the patient's chart and labs.  Questions were answered to the patient's satisfaction.     Charlie Pitter III

## 2022-06-11 NOTE — Plan of Care (Signed)
  Problem: Education: Goal: Knowledge of cardiac device and self-care will improve Outcome: Progressing Goal: Ability to safely manage health related needs after discharge will improve Outcome: Progressing Goal: Individualized Educational Video(s) Outcome: Progressing   Problem: Cardiac: Goal: Ability to achieve and maintain adequate cardiopulmonary perfusion will improve Outcome: Progressing   Problem: Education: Goal: Knowledge of General Education information will improve Description: Including pain rating scale, medication(s)/side effects and non-pharmacologic comfort measures Outcome: Progressing   Problem: Health Behavior/Discharge Planning: Goal: Ability to manage health-related needs will improve Outcome: Progressing   Problem: Clinical Measurements: Goal: Ability to maintain clinical measurements within normal limits will improve Outcome: Progressing Goal: Will remain free from infection Outcome: Progressing Goal: Diagnostic test results will improve Outcome: Progressing Goal: Cardiovascular complication will be avoided Outcome: Progressing   Problem: Activity: Goal: Risk for activity intolerance will decrease Outcome: Progressing   Problem: Nutrition: Goal: Adequate nutrition will be maintained Outcome: Progressing   Problem: Coping: Goal: Level of anxiety will decrease Outcome: Progressing   Problem: Elimination: Goal: Will not experience complications related to urinary retention Outcome: Progressing   Problem: Pain Managment: Goal: General experience of comfort will improve Outcome: Progressing   Problem: Safety: Goal: Ability to remain free from injury will improve Outcome: Progressing   Problem: Skin Integrity: Goal: Risk for impaired skin integrity will decrease Outcome: Progressing

## 2022-06-11 NOTE — Anesthesia Preprocedure Evaluation (Addendum)
Anesthesia Evaluation  Patient identified by MRN, date of birth, ID band Patient awake    Reviewed: Allergy & Precautions, NPO status , Patient's Chart, lab work & pertinent test results  Airway Mallampati: II  TM Distance: >3 FB Neck ROM: Full    Dental no notable dental hx.    Pulmonary neg pulmonary ROS   Pulmonary exam normal        Cardiovascular hypertension, Pt. on medications Normal cardiovascular exam+ dysrhythmias Atrial Fibrillation      Neuro/Psych   Anxiety      Neuromuscular disease    GI/Hepatic Neg liver ROS, PUD,,,  Endo/Other  negative endocrine ROS    Renal/GU negative Renal ROS     Musculoskeletal negative musculoskeletal ROS (+)    Abdominal   Peds  Hematology  (+) Blood dyscrasia (Eliquis), anemia   Anesthesia Other Findings Anemia and melena  Reproductive/Obstetrics                             Anesthesia Physical Anesthesia Plan  ASA: 3  Anesthesia Plan: MAC   Post-op Pain Management:    Induction: Intravenous  PONV Risk Score and Plan: 2 and Propofol infusion and Treatment may vary due to age or medical condition  Airway Management Planned: Nasal Cannula  Additional Equipment:   Intra-op Plan:   Post-operative Plan:   Informed Consent: I have reviewed the patients History and Physical, chart, labs and discussed the procedure including the risks, benefits and alternatives for the proposed anesthesia with the patient or authorized representative who has indicated his/her understanding and acceptance.     Dental advisory given  Plan Discussed with: CRNA  Anesthesia Plan Comments:         Anesthesia Quick Evaluation

## 2022-06-11 NOTE — Progress Notes (Signed)
Rounding Note    Patient Name: Sandy Trujillo Date of Encounter: 06/11/2022  [facility] Cardiologist: None   Subjective   A little anxious, but otherwise no complaints. EGD identified bleeding duodenal AVM which was successfully treated. Continues to have periods of bradycardia related to second-degree Mobitz type I AV block, but frequently has an accelerated junctional rhythm at 75 bpm.  Inpatient Medications    Scheduled Meds:  cholecalciferol  2,000 Units Oral Once per day on Sun Tue Thu Sat   lisinopril  10 mg Oral Daily   loratadine  5 mg Oral Daily   [START ON 06/13/2022] pantoprazole  40 mg Intravenous Q12H   Continuous Infusions:  sodium chloride 75 mL/hr at 06/11/22 0953   ferric gluconate (FERRLECIT) IVPB     pantoprazole 8 mg/hr (06/11/22 1102)   PRN Meds: acetaminophen, ipratropium-albuterol, ondansetron (ZOFRAN) IV   Vital Signs    Vitals:   06/11/22 0945 06/11/22 0950 06/11/22 0955 06/11/22 1057  BP:  129/64  (!) 157/65  Pulse: 65 75 64   Resp: 17 14 17    Temp:      TempSrc:      SpO2: 96% 95% 96%   Weight:      Height:        Intake/Output Summary (Last 24 hours) at 06/11/2022 1141 Last data filed at 06/11/2022 1057 Gross per 24 hour  Intake 2471.64 ml  Output 1651 ml  Net 820.64 ml      06/11/2022    5:12 AM 06/10/2022    1:04 PM 06/09/2022    1:53 PM  Last 3 Weights  Weight (lbs) 130 lb 15.3 oz 128 lb 4.9 oz 132 lb  Weight (kg) 59.4 kg 58.2 kg 59.875 kg      Telemetry    Sinus rhythm with second-degree AV block Mobitz type I and periods of accelerated junctional rhythm- Personally Reviewed  ECG    No new tracing- Personally Reviewed  Physical Exam  Elderly, frail GEN: No acute distress.   Neck: No JVD Cardiac: RRR.'s of bradycardia and irregularity, no murmurs, rubs, or gallops.  Respiratory: Clear to auscultation bilaterally. GI: Soft, nontender, non-distended  MS: No edema; No deformity. Neuro:  Nonfocal   Psych: Normal affect   Labs    High Sensitivity Troponin:  No results for input(s): "TROPONINIHS" in the last 720 hours.   Chemistry Recent Labs  Lab 06/09/22 1626 06/09/22 1638 06/09/22 2150 06/10/22 1036 06/11/22 0053  NA 128* 126*  --  132* 131*  K 4.6 5.0  --  4.6 4.2  CL 95* 99  --  99 100  CO2 23  --   --  23 22  GLUCOSE 103* 105*  --  80 62*  BUN 44* 55*  --  37* 30*  CREATININE 0.83 0.90 0.83 0.77 0.77  CALCIUM 9.6  --   --  9.0 8.5*  MG 2.2  --   --  1.9 1.7  PROT 7.0  --   --   --  6.2*  ALBUMIN 3.6  --   --   --  3.1*  AST 41  --   --   --  36  ALT 41  --   --   --  35  ALKPHOS 82  --   --   --  67  BILITOT 0.4  --   --   --  1.2  GFRNONAA >60  --  >60 >60 >60  ANIONGAP 10  --   --  10 9    Lipids  Recent Labs  Lab 06/10/22 1024  CHOL 189  TRIG 80  HDL 75  LDLCALC 98  CHOLHDL 2.5    Hematology Recent Labs  Lab 06/10/22 1036 06/10/22 1751 06/11/22 0053  WBC 5.8 6.2 5.6  RBC 3.12* 3.28* 2.94*  HGB 8.4* 8.9* 7.9*  HCT 26.7* 27.6* 25.1*  MCV 85.6 84.1 85.4  MCH 26.9 27.1 26.9  MCHC 31.5 32.2 31.5  RDW 17.1* 17.5* 17.5*  PLT 192 197 182   Thyroid  Recent Labs  Lab 06/10/22 1036  TSH 2.662    BNPNo results for input(s): "BNP", "PROBNP" in the last 168 hours.  DDimer No results for input(s): "DDIMER" in the last 168 hours.   Radiology    DG Chest Portable 1 View  Result Date: 06/09/2022 CLINICAL DATA:  Bradycardia EXAM: PORTABLE CHEST 1 VIEW COMPARISON:  X-ray 05/09/2022. FINDINGS: Small bilateral pleural effusions with some adjacent opacities, similar to previous. Mild thickening along the minor fissure. Stable cardiopericardial silhouette. No edema or pneumothorax. Surgical clips in the upper thorax. Dense left-sided lung nodules consistent with old granulomatous disease. Degenerative changes of the spine. Overlapping cardiac leads and defibrillator pads. IMPRESSION: Stable x-ray. Small effusions and adjacent opacities. Chronic changes  elsewhere Electronically Signed   By: Karen Kays M.D.   On: 06/09/2022 16:51    Cardiac Studies   Echocardiogram 05/10/2022     1. Left ventricular ejection fraction, by estimation, is 50 to 55%. The  left ventricle has low normal function. The left ventricle has no regional  wall motion abnormalities. Left ventricular diastolic parameters are  indeterminate.   2. Right ventricular systolic function is mildly reduced. The right  ventricular size is mildly enlarged. There is mildly elevated pulmonary  artery systolic pressure.   3. Left atrial size was severely dilated.   4. Right atrial size was moderately dilated.   5. The mitral valve is normal in structure. Moderate mitral valve  regurgitation. No evidence of mitral stenosis.   6. Tricuspid valve regurgitation is moderate.   7. The aortic valve is tricuspid. There is mild calcification of the  aortic valve. There is mild thickening of the aortic valve. Aortic valve  regurgitation is not visualized. No aortic stenosis is present.   8. The inferior vena cava is normal in size with greater than 50%  respiratory variability, suggesting right atrial pressure of 3 mmHg.     Patient Profile     89 y.o. female without major structural cardiac problems underwent elective cardioversion for atrial fibrillation with slow ventricular response 06/09/2022 after which she developed symptomatic bradycardia due to sinus rhythm with second-degree AV block Mobitz type I, but also rapidly worsening anemia after starting oral anticoagulation.  He demonstrated bleeding duodenal AVM, successfully cauterized/clipped 06/11/2022  Assessment & Plan    Persistent atrial fibrillation: No recurrence since 06/09/2018 for cardioversion.  Unfortunately we have had to interrupt anticoagulants due to active GI bleeding.  Will resume anticoagulants on 06/14/2022. Symptomatic bradycardia/second-degree AV block Mobitz type I: She had spontaneously rate controlled  atrial fibrillation before her cardioversion and continues to show evidence of AV node disease.  Thankfully also has lengthy periods of sustained accelerated junctional rhythm at about 75 bpm.  Has not had syncope.  Will eventually need a pacemaker.  Since she is currently off anticoagulants, it would be an ideal opportunity to place the pacemaker now before she is discharged home. Iron deficiency anemia: Precedes initiation of anticoagulation but became substantially  worse at a rapid pace on Eliquis.  The culprit bleeding source appears to be identified and treated.  Will require continued iron supplementation. Abdominal arteriovenous malformation: Appreciate Dr. Myrtie Neither treatment and recommendations.  Hold anticoagulation for another 3 days Hyponatremia: Chronic abnormality dating back at least 2015, varying send degrees of severity.  Appears to be asymptomatic.  Monitor while in the hospital. History of rectal cancer: She had surgery only with ileostomy.  She has not had any clinical evidence of malignancy recurrence since the initial surgery in 2007. History of hyperparathyroidism: Calcium levels have been normal since she had her partial parathyroidectomy. Aortic atherosclerosis/coronary calcifications: This was incidentally noted on aCT urogram of the chest performed to exclude pulmonary embolism a month ago, when she was first diagnosed with atrial fibrillation.  On my review the degree of vascular calcification is probably average for an octogenarian.  She has not had angina or other clinical evidence of significant CAD.  She does not have ischemic changes on the ECG.  The aorta is normal in caliber.  There was no evidence of pulmonary embolism. GERD: Note is made on the CT of her chest that she has a patulous esophagus with fluid and debris extending into the upper esophagus.  She would therefore be considered at risk for aspiration. Anxiety: A longstanding issue for her.     For questions or  updates, please contact [facility] Please consult [email or web address] for contact info under        Signed, Thurmon Fair, MD  06/11/2022, 11:41 AM

## 2022-06-11 NOTE — Progress Notes (Addendum)
Telemetry reviewed Pt in endoscopy SR, intermittent Mobitz one with some transient rates 49-50, as well as her longstanding known accel junctional 70's-80's No AFib No significant bradycardia or high AV block noted  Given DCCV 06/17/22, not ideal to have her off her OAC though with marked anemia, bleeding, no options. To resume ASAP once safe/cleared to by IM/GI services  Ep will continue to follow from afar  Francis Dowse, PA-C  ADDEND: Report from Dr. Leda Quail bleeding duodenal AVM cauterized and clipped.Recommend some IV iron prior to discharge  OK to resume OAC in 3 days  Francis Dowse, PA-C  ADDEND: Telemetry re-reviewed  She is now in a rate controlled AFib She has not had any advanced heart block or significant bradycardia D/w Dr. Nelly Laurence No plans for PPM this admission EP follow up will be arranged for her  Francis Dowse, PA-C

## 2022-06-11 NOTE — Transfer of Care (Signed)
Immediate Anesthesia Transfer of Care Note  Patient: Sandy Trujillo  Procedure(s) Performed: ESOPHAGOGASTRODUODENOSCOPY (EGD) WITH PROPOFOL HOT HEMOSTASIS (ARGON PLASMA COAGULATION/BICAP) HEMOSTASIS CLIP PLACEMENT  Patient Location: Endoscopy Unit  Anesthesia Type:MAC  Level of Consciousness: awake and drowsy  Airway & Oxygen Therapy: Patient Spontanous Breathing and Patient connected to nasal cannula oxygen  Post-op Assessment: Report given to RN and Post -op Vital signs reviewed and stable  Post vital signs: Reviewed and stable  Last Vitals: SEE POSTOP VITALS Vitals Value Taken Time  BP    Temp    Pulse    Resp    SpO2 96     Last Pain:  Vitals:   06/11/22 0753  TempSrc: Temporal  PainSc: 0-No pain         Complications: No notable events documented.

## 2022-06-11 NOTE — Progress Notes (Signed)
PROGRESS NOTE    Sandy Trujillo  ZOX:[id number] DOB: 10-03-1932 DOA: 06/09/2022 PCP: Thana Ates, MD   Brief Narrative:  89 year old female history of paroxysmal atrial fibrillation status post DC cardioversion 06/07/2022 with subsequent second-degree AV block Mobitz type I following cardioversion, hypertension, prior hyperparathyroidism status post right inferior parathyroidectomy, GERD (patient denies), history of rectal cancer status post surgical resection end ileostomy since 2007, history of ulcerative colitis was admitted under cardiology service due to concerns for rhythm issues.  EP was consulted as well.  She was noted to have drop in hemoglobin up to 6.6 needing packed red cell transfusion.  TRH was consulted on 06/10/2022.  She was started on IV Protonix.  Subsequently GI was consulted.  Care transferred to [facility] service from 06/11/2022 onwards.  Assessment & Plan:   Symptomatic anemia/acute on chronic blood loss anemia Probable upper GI bleeding with melanotic stools - noted to have drop in hemoglobin up to 6.6 needing packed red cell transfusion.  TRH was consulted on 06/10/2022.  She was started on IV Protonix.  Subsequently GI was consulted.  Care transferred to [facility] service from 06/11/2022 onwards. -Hemoglobin 7.9 this morning.  Monitor H&H.  GI planning for upper GI endoscopy today.  Eliquis on hold.  Symptomatic bradycardia due to second-degree A-V block Mobitz type I Persistent A-fib status post cardioversion on 06/07/2022 -Cardiology/EP following.  Currently rate controlled.  Will resume Eliquis once cleared by GI.  Chronic hyponatremia -Sodium stable.  Decrease normal saline to 75 cc an hour.  Repeat a.m. labs.  Hypertension -Continue lisinopril.  Monitor blood pressure  History of rectal cancer status post ileostomy History of ulcerative colitis -Outpatient follow-up with PCP/oncology.  GI following  History of hyperparathyroidism status post partial  parathyroidectomy -Outpatient follow-up  Aortic atherosclerosis/coronary calcifications -Cardiology following  DVT prophylaxis: Eliquis on hold. Code Status: Full Family Communication: daughter at bedside Disposition Plan: Status is: Inpatient Remains inpatient appropriate because: Of severity of illness    Consultants: Cardiology/EP/GI  Procedures: None  Antimicrobials: None   Subjective: Patient seen and examined at bedside.  No fever, chest pain, worsening shortness of breath reported.  Objective: Vitals:   06/11/22 0945 06/11/22 0950 06/11/22 0955 06/11/22 1057  BP:  129/64  (!) 157/65  Pulse: 65 75 64   Resp: 17 14 17    Temp:      TempSrc:      SpO2: 96% 95% 96%   Weight:      Height:        Intake/Output Summary (Last 24 hours) at 06/11/2022 1102 Last data filed at 06/11/2022 1057 Gross per 24 hour  Intake 2471.64 ml  Output 1651 ml  Net 820.64 ml   Filed Weights   06/10/22 1304 06/11/22 0512  Weight: 58.2 kg 59.4 kg    Examination:  General exam: Appears calm and comfortable.  On 3 L oxygen via nasal cannula.  Looks chronically ill and deconditioned. Respiratory system: Bilateral decreased breath sounds at bases with scattered crackles Cardiovascular system: S1 & S2 heard, Rate controlled Gastrointestinal system: Abdomen is nondistended, soft and nontender. Normal bowel sounds heard. Extremities: No cyanosis, clubbing; trace lower extremity edema Central nervous system: Alert and oriented. No focal neurological deficits. Moving extremities Skin: No rashes, lesions or ulcers Psychiatry: Flat affect; not agitated.    Data Reviewed: I have personally reviewed following labs and imaging studies  CBC: Recent Labs  Lab 06/09/22 1626 06/09/22 1638 06/09/22 2150 06/09/22 2330 06/10/22 1036 06/10/22 1751 06/11/22 0053  WBC  5.7  --  4.2  --  5.8 6.2 5.6  NEUTROABS 4.0  --   --   --  4.4  --  3.9  HGB 7.9*   < > 6.6* 6.8* 8.4* 8.9* 7.9*  HCT  25.4*   < > 20.6* 21.3* 26.7* 27.6* 25.1*  MCV 88.8  --  85.5  --  85.6 84.1 85.4  PLT 211  --  189  --  192 197 182   < > = values in this interval not displayed.   Basic Metabolic Panel: Recent Labs  Lab 06/09/22 1626 06/09/22 1638 06/09/22 2150 06/10/22 1036 06/11/22 0053  NA 128* 126*  --  132* 131*  K 4.6 5.0  --  4.6 4.2  CL 95* 99  --  99 100  CO2 23  --   --  23 22  GLUCOSE 103* 105*  --  80 62*  BUN 44* 55*  --  37* 30*  CREATININE 0.83 0.90 0.83 0.77 0.77  CALCIUM 9.6  --   --  9.0 8.5*  MG 2.2  --   --  1.9 1.7   GFR: Estimated Creatinine Clearance: 40 mL/min (by C-G formula based on SCr of 0.77 mg/dL). Liver Function Tests: Recent Labs  Lab 06/09/22 1626 06/11/22 0053  AST 41 36  ALT 41 35  ALKPHOS 82 67  BILITOT 0.4 1.2  PROT 7.0 6.2*  ALBUMIN 3.6 3.1*   No results for input(s): "LIPASE", "AMYLASE" in the last 168 hours. No results for input(s): "AMMONIA" in the last 168 hours. Coagulation Profile: Recent Labs  Lab 06/09/22 1626  INR 1.3*   Cardiac Enzymes: No results for input(s): "CKTOTAL", "CKMB", "CKMBINDEX", "TROPONINI" in the last 168 hours. BNP (last 3 results) No results for input(s): "PROBNP" in the last 8760 hours. HbA1C: No results for input(s): "HGBA1C" in the last 72 hours. CBG: No results for input(s): "GLUCAP" in the last 168 hours. Lipid Profile: Recent Labs    06/10/22 1024  CHOL 189  HDL 75  LDLCALC 98  TRIG 80  CHOLHDL 2.5   Thyroid Function Tests: Recent Labs    06/10/22 1036  TSH 2.662   Anemia Panel: Recent Labs    06/09/22 1900  VITAMINB12 1,117*  FOLATE 19.1  FERRITIN 35  TIBC 459*  IRON 57   Sepsis Labs: No results for input(s): "PROCALCITON", "LATICACIDVEN" in the last 168 hours.  Recent Results (from the past 240 hour(s))  Urine Culture (for pregnant, neutropenic or urologic patients or patients with an indwelling urinary catheter)     Status: Abnormal   Collection Time: 06/10/22 10:24 AM    Specimen: Urine, Clean Catch  Result Value Ref Range Status   Specimen Description URINE, CLEAN CATCH  Final   Special Requests NONE  Final   Culture (A)  Final    <10,000 COLONIES/mL INSIGNIFICANT GROWTH Performed at [facility], [address] [place] [facility] [place], [place] [address]    Report Status 06/11/2022 FINAL  Final         Radiology Studies: DG Chest Portable 1 View  Result Date: 06/09/2022 CLINICAL DATA:  Bradycardia EXAM: PORTABLE CHEST 1 VIEW COMPARISON:  X-ray 05/09/2022. FINDINGS: Small bilateral pleural effusions with some adjacent opacities, similar to previous. Mild thickening along the minor fissure. Stable cardiopericardial silhouette. No edema or pneumothorax. Surgical clips in the upper thorax. Dense left-sided lung nodules consistent with old granulomatous disease. Degenerative changes of the spine. Overlapping cardiac leads and defibrillator pads. IMPRESSION: Stable x-ray.  Small effusions and adjacent opacities. Chronic changes elsewhere Electronically Signed   By: Karen Kays M.D.   On: 06/09/2022 16:51        Scheduled Meds:  cholecalciferol  2,000 Units Oral Once per day on Sun Tue Thu Sat   lisinopril  10 mg Oral Daily   loratadine  5 mg Oral Daily   [START ON 06/13/2022] pantoprazole  40 mg Intravenous Q12H   Continuous Infusions:  sodium chloride 75 mL/hr at 06/11/22 0953   pantoprazole 8 mg/hr (06/11/22 0538)          Glade Lloyd, MD [facility] 06/11/2022, 11:02 AM

## 2022-06-12 DIAGNOSIS — K921 Melena: Secondary | ICD-10-CM | POA: Diagnosis not present

## 2022-06-12 DIAGNOSIS — I480 Paroxysmal atrial fibrillation: Secondary | ICD-10-CM | POA: Diagnosis not present

## 2022-06-12 DIAGNOSIS — I441 Atrioventricular block, second degree: Secondary | ICD-10-CM | POA: Diagnosis not present

## 2022-06-12 LAB — MAGNESIUM: Magnesium: 1.8 mg/dL (ref 1.7–2.4)

## 2022-06-12 LAB — CBC WITH DIFFERENTIAL/PLATELET
Abs Immature Granulocytes: 0.03 10*3/uL (ref 0.00–0.07)
Basophils Absolute: 0 10*3/uL (ref 0.0–0.1)
Basophils Relative: 1 %
Eosinophils Absolute: 0.1 10*3/uL (ref 0.0–0.5)
Eosinophils Relative: 1 %
HCT: 25.9 % — ABNORMAL LOW (ref 36.0–46.0)
Hemoglobin: 8.2 g/dL — ABNORMAL LOW (ref 12.0–15.0)
Immature Granulocytes: 0 %
Lymphocytes Relative: 12 %
Lymphs Abs: 0.8 10*3/uL (ref 0.7–4.0)
MCH: 27.1 pg (ref 26.0–34.0)
MCHC: 31.7 g/dL (ref 30.0–36.0)
MCV: 85.5 fL (ref 80.0–100.0)
Monocytes Absolute: 0.6 10*3/uL (ref 0.1–1.0)
Monocytes Relative: 8 %
Neutro Abs: 5.4 10*3/uL (ref 1.7–7.7)
Neutrophils Relative %: 78 %
Platelets: 192 10*3/uL (ref 150–400)
RBC: 3.03 MIL/uL — ABNORMAL LOW (ref 3.87–5.11)
RDW: 18 % — ABNORMAL HIGH (ref 11.5–15.5)
WBC: 7 10*3/uL (ref 4.0–10.5)
nRBC: 0 % (ref 0.0–0.2)

## 2022-06-12 LAB — BASIC METABOLIC PANEL
Anion gap: 7 (ref 5–15)
BUN: 25 mg/dL — ABNORMAL HIGH (ref 8–23)
CO2: 21 mmol/L — ABNORMAL LOW (ref 22–32)
Calcium: 8.2 mg/dL — ABNORMAL LOW (ref 8.9–10.3)
Chloride: 106 mmol/L (ref 98–111)
Creatinine, Ser: 0.74 mg/dL (ref 0.44–1.00)
GFR, Estimated: >60 mL/min (ref >60)
Glucose, Bld: 83 mg/dL (ref 70–99)
Potassium: 4.1 mmol/L (ref 3.5–5.1)
Sodium: 134 mmol/L — ABNORMAL LOW (ref 135–145)

## 2022-06-12 MED ORDER — PANTOPRAZOLE SODIUM 40 MG PO TBEC
40.0000 mg | DELAYED_RELEASE_TABLET | Freq: Every day | ORAL | Status: DC
  Administered 2022-06-12 – 2022-06-13 (×2): 40 mg via ORAL
  Filled 2022-06-12 (×2): qty 1

## 2022-06-12 NOTE — Progress Notes (Signed)
PROGRESS NOTE    Sandy Trujillo  WUJ:[id number] DOB: 04/27/1932 DOA: 06/09/2022 PCP: Thana Ates, MD   Brief Narrative:  89 year old female history of paroxysmal atrial fibrillation status post DC cardioversion 06/07/2022 with subsequent second-degree AV block Mobitz type I following cardioversion, hypertension, prior hyperparathyroidism status post right inferior parathyroidectomy, GERD (patient denies), history of rectal cancer status post surgical resection end ileostomy since 2007, history of ulcerative colitis was admitted under cardiology service due to concerns for rhythm issues.  EP was consulted as well.  She was noted to have drop in hemoglobin up to 6.6 needing packed red cell transfusion.  TRH was consulted on 06/10/2022.  She was started on IV Protonix.  Subsequently GI was consulted.  Care transferred to [facility] service from 06/11/2022 onwards.  She underwent EGD on 06/11/2022.  Assessment & Plan:   Symptomatic anemia/acute on chronic blood loss anemia Probable upper GI bleeding with melanotic stools - noted to have drop in hemoglobin up to 6.6 needing packed red cell transfusion.  TRH was consulted on 06/10/2022.  She was started on IV Protonix.  Subsequently GI was consulted.  Care transferred to [facility] service from 06/11/2022 onwards. -Status post EGD on 06/11/2022: Showed a single bleeding angioectasia in the duodenum which was treated with APC and clips.  Subsequently IV iron was given as per GI recommendations.  GI recommends Protonix 40 mg once daily for 14 days and oral anticoagulation can be resumed 2 days after the procedure. -Hemoglobin 8.2 this morning.  Monitor H&H.  Eliquis on hold.  Symptomatic bradycardia due to second-degree A-V block Mobitz type I Persistent A-fib status post cardioversion on 06/07/2022 -Cardiology/EP following.  Currently rate controlled.  Eliquis plan as above.  EP planning for outpatient follow-up.  Family has questions regarding the same.  Will follow  further recommendations from cardiology/EP.  Chronic hyponatremia -Sodium stable.  DC IV fluids.  Repeat a.m. labs.  Hypertension -Continue lisinopril.  Monitor blood pressure  History of rectal cancer status post ileostomy History of ulcerative colitis -Outpatient follow-up with PCP/oncology.  GI following  History of hyperparathyroidism status post partial parathyroidectomy -Outpatient follow-up  Aortic atherosclerosis/coronary calcifications -Cardiology following  Physical deconditioning -PT eval  DVT prophylaxis: Eliquis on hold. Code Status: Full Family Communication: daughters at bedside Disposition Plan: Status is: Inpatient Remains inpatient appropriate because: Of severity of illness    Consultants: Cardiology/EP/GI  Procedures: EGD on 06/11/2022 Impression:               - Normal larynx.                           - Normal esophagus except for mild Schatzki ring.                           - Normal stomach.                           - Blood in the second portion of the duodenum.                           - A single bleeding angioectasia in the duodenum.                            Treated with argon plasma coagulation (APC). Clips                            (  MR conditional) were placed. Clip manufacturer:                            [employer].                           - No specimens collected. Recommendation:           - Return patient to hospital ward for ongoing care.                           - Resume regular diet.                           - This patient would benefit from intravenous iron                            prior to discharge. She will also then need oral                            iron post discharge.                           After discharge, hemoglobin should be checked a                            week later by primary care or cardiology.                           If OAC needs to be resumed after recent DCCV for                             A-fib, it can be resumed 3 days from now at regular                            dose.                           Pantoprazole 40 mg once daily for 14 days.  Antimicrobials: None   Subjective: Patient seen and examined at bedside.  Feels slightly better.  Still feels weak.  No black or bloody ostomy output noted as per nursing staff.  No fever or vomiting reported. Objective: Vitals:   06/11/22 1935 06/12/22 0040 06/12/22 0529 06/12/22 0739  BP: 139/68 126/61 (!) 109/50 129/70  Pulse: 65 65 78 75  Resp: 19 15 19 20   Temp: 97.6 F (36.4 C) 97.6 F (36.4 C) 97.7 F (36.5 C) (!) 97 F (36.1 C)  TempSrc: Oral Oral Oral Axillary  SpO2: 96% 97% 92% 94%  Weight:   63.1 kg   Height:        Intake/Output Summary (Last 24 hours) at 06/12/2022 0905 Last data filed at 06/12/2022 0527 Gross per 24 hour  Intake 2181.19 ml  Output 1750 ml  Net 431.19 ml    Filed Weights   06/10/22 1304 06/11/22 0512 06/12/22 0529  Weight: 58.2 kg 59.4 kg 63.1 kg    Examination:  General: On room air.  No distress.  Looks  chronically ill and deconditioned. ENT/neck: No thyromegaly.  JVD is not elevated  respiratory: Decreased breath sounds at bases bilaterally with some crackles; no wheezing  CVS: S1-S2 heard, rate controlled Abdominal: Soft, nontender, slightly distended; no organomegaly, normal bowel sounds are heard.  Ostomy bag in place. Extremities: Trace lower extremity edema; no cyanosis  CNS: Awake and alert.  Slow to respond.  No focal neurologic deficit.  Moves extremities Lymph: No obvious lymphadenopathy Skin: No obvious ecchymosis/lesions  psych: Smiles intermittently. musculoskeletal: No obvious joint swelling/deformity     Data Reviewed: I have personally reviewed following labs and imaging studies  CBC: Recent Labs  Lab 06/09/22 1626 06/09/22 1638 06/09/22 2150 06/09/22 2330 06/10/22 1036 06/10/22 1751 06/11/22 0053 06/12/22 0053  WBC 5.7  --  4.2  --  5.8 6.2 5.6 7.0   NEUTROABS 4.0  --   --   --  4.4  --  3.9 5.4  HGB 7.9*   < > 6.6* 6.8* 8.4* 8.9* 7.9* 8.2*  HCT 25.4*   < > 20.6* 21.3* 26.7* 27.6* 25.1* 25.9*  MCV 88.8  --  85.5  --  85.6 84.1 85.4 85.5  PLT 211  --  189  --  192 197 182 192   < > = values in this interval not displayed.    Basic Metabolic Panel: Recent Labs  Lab 06/09/22 1626 06/09/22 1638 06/09/22 2150 06/10/22 1036 06/11/22 0053 06/12/22 0053  NA 128* 126*  --  132* 131* 134*  K 4.6 5.0  --  4.6 4.2 4.1  CL 95* 99  --  99 100 106  CO2 23  --   --  23 22 21*  GLUCOSE 103* 105*  --  80 62* 83  BUN 44* 55*  --  37* 30* 25*  CREATININE 0.83 0.90 0.83 0.77 0.77 0.74  CALCIUM 9.6  --   --  9.0 8.5* 8.2*  MG 2.2  --   --  1.9 1.7 1.8    GFR: Estimated Creatinine Clearance: 41.1 mL/min (by C-G formula based on SCr of 0.74 mg/dL). Liver Function Tests: Recent Labs  Lab 06/09/22 1626 06/11/22 0053  AST 41 36  ALT 41 35  ALKPHOS 82 67  BILITOT 0.4 1.2  PROT 7.0 6.2*  ALBUMIN 3.6 3.1*    No results for input(s): "LIPASE", "AMYLASE" in the last 168 hours. No results for input(s): "AMMONIA" in the last 168 hours. Coagulation Profile: Recent Labs  Lab 06/09/22 1626  INR 1.3*    Cardiac Enzymes: No results for input(s): "CKTOTAL", "CKMB", "CKMBINDEX", "TROPONINI" in the last 168 hours. BNP (last 3 results) No results for input(s): "PROBNP" in the last 8760 hours. HbA1C: No results for input(s): "HGBA1C" in the last 72 hours. CBG: No results for input(s): "GLUCAP" in the last 168 hours. Lipid Profile: Recent Labs    06/10/22 1024  CHOL 189  HDL 75  LDLCALC 98  TRIG 80  CHOLHDL 2.5    Thyroid Function Tests: Recent Labs    06/10/22 1036  TSH 2.662    Anemia Panel: Recent Labs    06/09/22 1900  VITAMINB12 1,117*  FOLATE 19.1  FERRITIN 35  TIBC 459*  IRON 57    Sepsis Labs: No results for input(s): "PROCALCITON", "LATICACIDVEN" in the last 168 hours.  Recent Results (from the past 240  hour(s))  Urine Culture (for pregnant, neutropenic or urologic patients or patients with an indwelling urinary catheter)     Status: Abnormal   Collection Time:  06/10/22 10:24 AM   Specimen: Urine, Clean Catch  Result Value Ref Range Status   Specimen Description URINE, CLEAN CATCH  Final   Special Requests NONE  Final   Culture (A)  Final    <10,000 COLONIES/mL INSIGNIFICANT GROWTH Performed at [facility], [address] [place], [place] [address]    Report Status 06/11/2022 FINAL  Final         Radiology Studies: No results found.      Scheduled Meds:  cholecalciferol  2,000 Units Oral Once per day on Sun Tue Thu Sat   lisinopril  10 mg Oral Daily   loratadine  5 mg Oral Daily   [START ON 06/13/2022] pantoprazole  40 mg Intravenous Q12H   Continuous Infusions:  sodium chloride 50 mL/hr at 06/12/22 0527   pantoprazole 8 mg/hr (06/12/22 0527)          Glade Lloyd, MD [facility] 06/12/2022, 9:05 AM

## 2022-06-12 NOTE — Plan of Care (Signed)

## 2022-06-12 NOTE — Anesthesia Preprocedure Evaluation (Signed)
Anesthesia Evaluation  Patient identified by MRN, date of birth, ID band Patient awake    Reviewed: Allergy & Precautions, NPO status , Patient's Chart, lab work & pertinent test results  History of Anesthesia Complications Negative for: history of anesthetic complications  Airway Mallampati: III  TM Distance: >3 FB Neck ROM: Full    Dental  (+) Dental Advisory Given   Pulmonary neg sleep apnea, neg COPD, neg recent URI   breath sounds clear to auscultation       Cardiovascular hypertension, Pt. on medications + dysrhythmias Atrial Fibrillation  Rhythm:Irregular     Neuro/Psych  PSYCHIATRIC DISORDERS Anxiety      Neuromuscular disease    GI/Hepatic PUD,GERD  ,,  Endo/Other    Renal/GU      Musculoskeletal   Abdominal   Peds  Hematology  (+) Blood dyscrasia, anemia   Anesthesia Other Findings   Reproductive/Obstetrics                              Anesthesia Physical Anesthesia Plan  ASA: 3  Anesthesia Plan: General   Post-op Pain Management: Minimal or no pain anticipated   Induction: Intravenous  PONV Risk Score and Plan: 3 and Treatment may vary due to age or medical condition  Airway Management Planned: Mask  Additional Equipment: None  Intra-op Plan:   Post-operative Plan:   Informed Consent: I have reviewed the patients History and Physical, chart, labs and discussed the procedure including the risks, benefits and alternatives for the proposed anesthesia with the patient or authorized representative who has indicated his/her understanding and acceptance.     Dental advisory given  Plan Discussed with: CRNA  Anesthesia Plan Comments:          Anesthesia Quick Evaluation

## 2022-06-12 NOTE — Evaluation (Signed)
Physical Therapy Evaluation Patient Details Name: Sandy Trujillo MRN: [id number] DOB: Dec 28, 1932 Today's Date: 06/12/2022  History of Present Illness  89 yo female was admitted to hosp 5/15 with fatigue and presyncope after having cardioversion for a-fib on 5/13 with second degree heart block.  Has felt dizzy and lightheaded since cardioversion, has required sitting down suddenly since then.  Having bradycardia upon admission, anxiety, and will receive a pacemaker for management. Noted also with EGD has upper GI bleed.  PMHx:  atherosclerosis, GERD, anxiety, anemia, HTN, hyponatremia, anemia, a-fib, R inferior thyroidectomy, rectal cancer, resection end ileostomy, ulcerative colitis,  Clinical Impression  Pt was seen for progression from bed to walking and declining to remain up in chair.  Her family has determined they want to take her directly home due to pt having such anxiety, but will need a family member to assist her initially 24/7 for safety.  Recommend her to have therapy follow up and instruct her in safety and management of walker, with current recommendation of RW if leaving before rollator can be attempted with pt.  Family was told rollator is too high level a skill for pt currently but can follow up with therapy to see if this is practical.  Follow acutely for goals of PT and assess her need for RW vs rollator, instruct balance corrections and monitor her O2 sats with HR.  Pt remained above 90% during therapy today, as monitored on continual meter.        Recommendations for follow up therapy are one component of a multi-disciplinary discharge planning process, led by the attending physician.  Recommendations may be updated based on patient status, additional functional criteria and insurance authorization.  Follow Up Recommendations       Assistance Recommended at Discharge Frequent or constant Supervision/Assistance  Patient can return home with the following  A little help with  walking and/or transfers;A little help with bathing/dressing/bathroom;Assistance with cooking/housework;Assist for transportation;Help with stairs or ramp for entrance    Equipment Recommendations Rolling walker (2 wheels);Rollator (4 wheels) (Need to determine which by DC)  Recommendations for Other Services       Functional Status Assessment Patient has had a recent decline in their functional status and demonstrates the ability to make significant improvements in function in a reasonable and predictable amount of time.     Precautions / Restrictions Precautions Precautions: Fall Precaution Comments: monitor HR and sats Restrictions Weight Bearing Restrictions: No      Mobility  Bed Mobility Overal bed mobility: Needs Assistance Bed Mobility: Supine to Sit, Sit to Supine     Supine to sit: Min guard, Min assist Sit to supine: Min assist        Transfers Overall transfer level: Needs assistance Equipment used: Rolling walker (2 wheels) Transfers: Sit to/from Stand Sit to Stand: Min guard           General transfer comment: pt able to stand but is getting minor cues for sequence of mobility    Ambulation/Gait Ambulation/Gait assistance: Min guard, Min assist Gait Distance (Feet): 45 Feet (15+30) Assistive device: Rolling walker (2 wheels) Gait Pattern/deviations: Step-through pattern, Step-to pattern, Decreased stride length, Wide base of support Gait velocity: reduced Gait velocity interpretation: <1.31 ft/sec, indicative of household ambulator Pre-gait activities: standing balance ck General Gait Details: Pt walked a short trip initially then walked to door with IV pole and RW  Stairs            Wheelchair Mobility    Modified  Rankin (Stroke Patients Only)       Balance Overall balance assessment: Needs assistance Sitting-balance support: Feet supported Sitting balance-Leahy Scale: Fair     Standing balance support: Bilateral upper extremity  supported, During functional activity Standing balance-Leahy Scale: Poor Standing balance comment: needs light standing support on walker                             Pertinent Vitals/Pain Pain Assessment Pain Assessment: Faces Faces Pain Scale: No hurt    Home Living Family/patient expects to be discharged to:: Private residence Living Arrangements: Alone Available Help at Discharge: Family;Available PRN/intermittently Type of Home: House Home Access: Stairs to enter Entrance Stairs-Rails: Doctor, general practice of Steps: 4   Home Layout: One level Home Equipment: Cane - single point;Standard Walker;Grab bars - tub/shower;Shower seat;BSC/3in1 (family has 3 in one)      Prior Function Prior Level of Function : Independent/Modified Independent             Mobility Comments: has begun needing more assist but family is watching, used std walker mainly       Hand Dominance        Extremity/Trunk Assessment   Upper Extremity Assessment Upper Extremity Assessment: Overall WFL for tasks assessed    Lower Extremity Assessment Lower Extremity Assessment: Generalized weakness    Cervical / Trunk Assessment Cervical / Trunk Assessment: Kyphotic (mild)  Communication   Communication: No difficulties  Cognition Arousal/Alertness: Awake/alert Behavior During Therapy: Anxious Overall Cognitive Status: Within Functional Limits for tasks assessed                                 General Comments: pt is participating in history and family adding to informatoin        General Comments General comments (skin integrity, edema, etc.): Pt is managing with help on RW but discussion was had about previous request to get rollator.  May be able to do this but today requires more help and would consider waiting to decide at dc    Exercises     Assessment/Plan    PT Assessment Patient needs continued PT services  PT Problem List Decreased  strength;Decreased activity tolerance;Decreased balance;Decreased mobility;Decreased coordination;Decreased knowledge of use of DME;Decreased safety awareness       PT Treatment Interventions DME instruction;Gait training;Functional mobility training;Therapeutic activities;Therapeutic exercise;Balance training;Neuromuscular re-education;Patient/family education;Stair training    PT Goals (Current goals can be found in the Care Plan section)  Acute Rehab PT Goals Patient Stated Goal: to get some rest and go home PT Goal Formulation: With patient/family Time For Goal Achievement: 06/26/22 Potential to Achieve Goals: Good    Frequency Min 3X/week     Co-evaluation               AM-PAC PT "6 Clicks" Mobility  Outcome Measure Help needed turning from your back to your side while in a flat bed without using bedrails?: A Little Help needed moving from lying on your back to sitting on the side of a flat bed without using bedrails?: A Little Help needed moving to and from a bed to a chair (including a wheelchair)?: A Little Help needed standing up from a chair using your arms (e.g., wheelchair or bedside chair)?: A Little Help needed to walk in hospital room?: A Little Help needed climbing 3-5 steps with a railing? : Total 6 Click Score:  16    End of Session Equipment Utilized During Treatment: Gait belt Activity Tolerance: Patient tolerated treatment well;Patient limited by fatigue;Treatment limited secondary to medical complications (Comment) (anxiety) Patient left: in bed;with call bell/phone within reach;with bed alarm set;with family/visitor present (declined to get in chair) Nurse Communication: Mobility status PT Visit Diagnosis: Unsteadiness on feet (R26.81);Muscle weakness (generalized) (M62.81);Difficulty in walking, not elsewhere classified (R26.2)    Time: 1610-9604 PT Time Calculation (min) (ACUTE ONLY): 25 min   Charges:   PT Evaluation $PT Eval Moderate  Complexity: 1 Mod PT Treatments $Therapeutic Activity: 8-22 mins       Ivar Drape 06/12/2022, 3:06 PM  Samul Dada, PT PhD Acute Rehab Dept. Number: [facility] [id number] and [facility] [phone]

## 2022-06-12 NOTE — Anesthesia Postprocedure Evaluation (Signed)
Anesthesia Post Note  Patient: Sandy Trujillo  Procedure(s) Performed: CARDIOVERSION     Patient location during evaluation: Cath Lab Anesthesia Type: General Level of consciousness: awake and alert Pain management: pain level controlled Vital Signs Assessment: post-procedure vital signs reviewed and stable Respiratory status: spontaneous breathing, nonlabored ventilation, respiratory function stable and patient connected to nasal cannula oxygen Cardiovascular status: stable Postop Assessment: no apparent nausea or vomiting Anesthetic complications: no   No notable events documented.  Last Vitals:  Vitals:   06/07/22 1035 06/07/22 1040  BP: (!) 139/57 127/68  Pulse: 69 75  Resp: 19 11  Temp:    SpO2: 98% 98%    Last Pain:  Vitals:   06/07/22 1015  TempSrc:   PainSc: 0-No pain                 Jarid Sasso

## 2022-06-12 NOTE — Progress Notes (Signed)
Cardiology Progress Note  Patient ID: Sandy Trujillo MRN: [id number] DOB: 1932-05-15 Date of Encounter: 06/12/2022  Primary Cardiologist: None  Subjective   Chief Complaint: None.   HPI: Back in atrial fibrillation.  Unaware of this.  Heart rate in the 70s.  ROS:  All other ROS reviewed and negative. Pertinent positives noted in the HPI.     Inpatient Medications  Scheduled Meds:  cholecalciferol  2,000 Units Oral Once per day on Sun Tue Thu Sat   lisinopril  10 mg Oral Daily   loratadine  5 mg Oral Daily   [START ON 06/13/2022] pantoprazole  40 mg Intravenous Q12H   Continuous Infusions:  pantoprazole 8 mg/hr (06/12/22 0527)   PRN Meds: acetaminophen, hydrOXYzine, ipratropium-albuterol, ondansetron (ZOFRAN) IV   Vital Signs   Vitals:   06/11/22 1935 06/12/22 0040 06/12/22 0529 06/12/22 0739  BP: 139/68 126/61 (!) 109/50 129/70  Pulse: 65 65 78 75  Resp: 19 15 19 20   Temp: 97.6 F (36.4 C) 97.6 F (36.4 C) 97.7 F (36.5 C) (!) 97 F (36.1 C)  TempSrc: Oral Oral Oral Axillary  SpO2: 96% 97% 92% 94%  Weight:   63.1 kg   Height:        Intake/Output Summary (Last 24 hours) at 06/12/2022 1006 Last data filed at 06/12/2022 0900 Gross per 24 hour  Intake 2401.19 ml  Output 1750 ml  Net 651.19 ml      06/12/2022    5:29 AM 06/11/2022    5:12 AM 06/10/2022    1:04 PM  Last 3 Weights  Weight (lbs) 139 lb 3.2 oz 130 lb 15.3 oz 128 lb 4.9 oz  Weight (kg) 63.141 kg 59.4 kg 58.2 kg      Telemetry  Overnight telemetry shows A-fib 70s, which I personally reviewed.    Physical Exam   Vitals:   06/11/22 1935 06/12/22 0040 06/12/22 0529 06/12/22 0739  BP: 139/68 126/61 (!) 109/50 129/70  Pulse: 65 65 78 75  Resp: 19 15 19 20   Temp: 97.6 F (36.4 C) 97.6 F (36.4 C) 97.7 F (36.5 C) (!) 97 F (36.1 C)  TempSrc: Oral Oral Oral Axillary  SpO2: 96% 97% 92% 94%  Weight:   63.1 kg   Height:        Intake/Output Summary (Last 24 hours) at 06/12/2022 1006 Last  data filed at 06/12/2022 0900 Gross per 24 hour  Intake 2401.19 ml  Output 1750 ml  Net 651.19 ml       06/12/2022    5:29 AM 06/11/2022    5:12 AM 06/10/2022    1:04 PM  Last 3 Weights  Weight (lbs) 139 lb 3.2 oz 130 lb 15.3 oz 128 lb 4.9 oz  Weight (kg) 63.141 kg 59.4 kg 58.2 kg    Body mass index is 27.19 kg/m.  General: Well nourished, well developed, in no acute distress Head: Atraumatic, normal size  Eyes: PEERLA, EOMI  Neck: Supple, no JVD Endocrine: No thryomegaly Cardiac: Normal S1, S2; irregular rhythm, no murmurs Lungs: Diminished breath sounds bilaterally Abd: Soft, nontender, no hepatomegaly  Ext: No edema, pulses 2+ Musculoskeletal: No deformities, BUE and BLE strength normal and equal Skin: Warm and dry, no rashes   Neuro: Alert and oriented to person, place, time, and situation, CNII-XII grossly intact, no focal deficits  Psych: Normal mood and affect   Labs  High Sensitivity Troponin:  No results for input(s): "TROPONINIHS" in the last 720 hours.   Cardiac EnzymesNo results for  input(s): "TROPONINI" in the last 168 hours. No results for input(s): "TROPIPOC" in the last 168 hours.  Chemistry Recent Labs  Lab 06/09/22 1626 06/09/22 1638 06/10/22 1036 06/11/22 0053 06/12/22 0053  NA 128*   < > 132* 131* 134*  K 4.6   < > 4.6 4.2 4.1  CL 95*   < > 99 100 106  CO2 23  --  23 22 21*  GLUCOSE 103*   < > 80 62* 83  BUN 44*   < > 37* 30* 25*  CREATININE 0.83   < > 0.77 0.77 0.74  CALCIUM 9.6  --  9.0 8.5* 8.2*  PROT 7.0  --   --  6.2*  --   ALBUMIN 3.6  --   --  3.1*  --   AST 41  --   --  36  --   ALT 41  --   --  35  --   ALKPHOS 82  --   --  67  --   BILITOT 0.4  --   --  1.2  --   GFRNONAA >60   < > >60 >60 >60  ANIONGAP 10  --  10 9 7    < > = values in this interval not displayed.    Hematology Recent Labs  Lab 06/10/22 1751 06/11/22 0053 06/12/22 0053  WBC 6.2 5.6 7.0  RBC 3.28* 2.94* 3.03*  HGB 8.9* 7.9* 8.2*  HCT 27.6* 25.1* 25.9*  MCV  84.1 85.4 85.5  MCH 27.1 26.9 27.1  MCHC 32.2 31.5 31.7  RDW 17.5* 17.5* 18.0*  PLT 197 182 192   BNPNo results for input(s): "BNP", "PROBNP" in the last 168 hours.  DDimer No results for input(s): "DDIMER" in the last 168 hours.   Radiology  No results found.  Cardiac Studies  TTE 05/10/2022  1. Left ventricular ejection fraction, by estimation, is 50 to 55%. The  left ventricle has low normal function. The left ventricle has no regional  wall motion abnormalities. Left ventricular diastolic parameters are  indeterminate.   2. Right ventricular systolic function is mildly reduced. The right  ventricular size is mildly enlarged. There is mildly elevated pulmonary  artery systolic pressure.   3. Left atrial size was severely dilated.   4. Right atrial size was moderately dilated.   5. The mitral valve is normal in structure. Moderate mitral valve  regurgitation. No evidence of mitral stenosis.   6. Tricuspid valve regurgitation is moderate.   7. The aortic valve is tricuspid. There is mild calcification of the  aortic valve. There is mild thickening of the aortic valve. Aortic valve  regurgitation is not visualized. No aortic stenosis is present.   8. The inferior vena cava is normal in size with greater than 50%  respiratory variability, suggesting right atrial pressure of 3 mmHg.   Patient Profile  Sandy Trujillo is a 89 y.o. female with persistent atrial fibrillation, secondary AV block, Mobitz 1, bradycardia admitted on 06/09/2022 for GI bleed.  Underwent recent outpatient cardioversion for atrial fibrillation.  Assessment & Plan   # Persistent atrial fibrillation -Status post recent cardioversion.  Back in A-fib today.  She has A-fib which is rate controlled on medication.  History of second-degree AV block, Mobitz 1.  Hold any AV nodal agents.  No consideration for pacemaker this admission. -Status post treatment of bleeding duodenal AVM.  GI has recommended to restart  anticoagulation on 06/14/2022.  # Second-degree AV block, Mobitz 1 #  Sinus bradycardia # Sick sinus syndrome -EP wanting to hold on pacemaker.  We will arrange close follow-up as an outpatient.  [facility] will sign off.   Medication Recommendations: As above Other recommendations (labs, testing, etc): None Follow up as an outpatient: We will arrange outpatient follow-up with Dr. Royann Shivers  For questions or updates, please contact [facility] Please consult [email or web address] for contact info under        Signed, Gerri Spore T. Flora Lipps, MD, [facility]  [facility]  06/12/2022 10:06 AM

## 2022-06-13 ENCOUNTER — Encounter (HOSPITAL_COMMUNITY): Payer: Self-pay | Admitting: Gastroenterology

## 2022-06-13 DIAGNOSIS — I441 Atrioventricular block, second degree: Secondary | ICD-10-CM | POA: Diagnosis not present

## 2022-06-13 DIAGNOSIS — K921 Melena: Secondary | ICD-10-CM | POA: Diagnosis not present

## 2022-06-13 LAB — CBC WITH DIFFERENTIAL/PLATELET
Abs Immature Granulocytes: 0.05 10*3/uL (ref 0.00–0.07)
Basophils Absolute: 0 10*3/uL (ref 0.0–0.1)
Basophils Relative: 0 %
Eosinophils Absolute: 0.3 10*3/uL (ref 0.0–0.5)
Eosinophils Relative: 3 %
HCT: 24.5 % — ABNORMAL LOW (ref 36.0–46.0)
Hemoglobin: 7.7 g/dL — ABNORMAL LOW (ref 12.0–15.0)
Immature Granulocytes: 1 %
Lymphocytes Relative: 12 %
Lymphs Abs: 1 10*3/uL (ref 0.7–4.0)
MCH: 27.1 pg (ref 26.0–34.0)
MCHC: 31.4 g/dL (ref 30.0–36.0)
MCV: 86.3 fL (ref 80.0–100.0)
Monocytes Absolute: 1 10*3/uL (ref 0.1–1.0)
Monocytes Relative: 12 %
Neutro Abs: 6.2 10*3/uL (ref 1.7–7.7)
Neutrophils Relative %: 72 %
Platelets: 189 10*3/uL (ref 150–400)
RBC: 2.84 MIL/uL — ABNORMAL LOW (ref 3.87–5.11)
RDW: 18.3 % — ABNORMAL HIGH (ref 11.5–15.5)
WBC: 8.5 10*3/uL (ref 4.0–10.5)
nRBC: 0.4 % — ABNORMAL HIGH (ref 0.0–0.2)

## 2022-06-13 LAB — BASIC METABOLIC PANEL
Anion gap: 9 (ref 5–15)
BUN: 22 mg/dL (ref 8–23)
CO2: 22 mmol/L (ref 22–32)
Calcium: 8.6 mg/dL — ABNORMAL LOW (ref 8.9–10.3)
Chloride: 103 mmol/L (ref 98–111)
Creatinine, Ser: 0.86 mg/dL (ref 0.44–1.00)
GFR, Estimated: >60 mL/min (ref >60)
Glucose, Bld: 83 mg/dL (ref 70–99)
Potassium: 3.8 mmol/L (ref 3.5–5.1)
Sodium: 134 mmol/L — ABNORMAL LOW (ref 135–145)

## 2022-06-13 LAB — MAGNESIUM: Magnesium: 1.7 mg/dL (ref 1.7–2.4)

## 2022-06-13 MED ORDER — PANTOPRAZOLE SODIUM 40 MG PO TBEC: 40.0000 mg | DELAYED_RELEASE_TABLET | Freq: Every day | ORAL | 0 refills | Status: DC

## 2022-06-13 MED ORDER — APIXABAN 5 MG PO TABS: 5.0000 mg | ORAL_TABLET | Freq: Two times a day (BID) | ORAL | Status: DC

## 2022-06-13 MED ORDER — LISINOPRIL 10 MG PO TABS: 10.0000 mg | ORAL_TABLET | Freq: Every day | ORAL | Status: DC

## 2022-06-13 NOTE — Progress Notes (Signed)
Approximately 13:30--Pt discharged to home with daughters. All belongings taken with pt--family and pt verified. Telemetry disconnected and notified.

## 2022-06-13 NOTE — Progress Notes (Signed)
Patient and daughters given discharge instructions and verbalized understanding. PIV x1 removed and cardiac monitor removed (central tele made aware). Family assisted with dressing.

## 2022-06-13 NOTE — Discharge Summary (Signed)
Physician Discharge Summary  OAKLYN DANSBY ZOX:[id number] DOB: 1932/12/07 DOA: 06/09/2022  PCP: Thana Ates, MD  Admit date: 06/09/2022 Discharge date: 06/13/2022  Admitted From: Home Disposition: Home  Recommendations for Outpatient Follow-up:  Follow up with PCP in 1 week with repeat CBC/BMP Outpatient follow-up with cardiology and GI Follow up in ED if symptoms worsen or new appear   Home Health: Home health PT Equipment/Devices: None  Discharge Condition: Stable CODE STATUS: Full Diet recommendation: Heart healthy  Brief/Interim Summary: 89 year old female history of paroxysmal atrial fibrillation status post DC cardioversion 06/07/2022 with subsequent second-degree AV block Mobitz type I following cardioversion, hypertension, prior hyperparathyroidism status post right inferior parathyroidectomy, GERD (patient denies), history of rectal cancer status post surgical resection end ileostomy since 2007, history of ulcerative colitis was admitted under cardiology service due to concerns for rhythm issues.  EP was consulted as well.  She was noted to have drop in hemoglobin up to 6.6 needing packed red cell transfusion.  TRH was consulted on 06/10/2022.  She was started on IV Protonix.  Subsequently GI was consulted.  Care transferred to [facility] service from 06/11/2022 onwards.  She underwent EGD on 06/11/2022.  Subsequently, she has remained stable, tolerating diet and hemoglobin has remained stable.  She received 1 dose of IV iron as well.  She is currently on oral Protonix.  Cardiology and GI have cleared the patient for discharge.  She will be discharged home today with close outpatient follow-up with PCP/cardiology and GI.  Discharge Diagnoses:   Symptomatic anemia/acute on chronic blood loss anemia Probable upper GI bleeding with melanotic stools - noted to have drop in hemoglobin up to 6.6 needing packed red cell transfusion.  TRH was consulted on 06/10/2022.  She was started on IV  Protonix.  Subsequently GI was consulted.  Care transferred to [facility] service from 06/11/2022 onwards. -Status post EGD on 06/11/2022: Showed a single bleeding angioectasia in the duodenum which was treated with APC and clips.  Subsequently IV iron was given as per GI recommendations.  GI recommends Protonix 40 mg once daily for 2 weeks and oral anticoagulation can be resumed from 06/14/2022 onwards. -Hemoglobin 7.7 this morning.  Outpatient follow-up of CBC.  Outpatient follow-up with GI   Symptomatic bradycardia due to second-degree A-V block Mobitz type I Persistent A-fib status post cardioversion on 06/07/2022 -Cardiology/EP evaluated the patient.  Currently rate controlled.  Eliquis plan as above.  EP planning for outpatient follow-up.  Cardiology/EP have cleared the patient for discharge.  Outpatient follow-up with them.  Chronic hyponatremia -Sodium stable.  Off IV fluids.  Outpatient follow-up   Hypertension -Continue lisinopril but only once a day.  Blood pressure on the lower side.    History of rectal cancer status post ileostomy History of ulcerative colitis -Outpatient follow-up with PCP/oncology.  Outpatient follow-up with GI.  History of hyperparathyroidism status post partial parathyroidectomy -Outpatient follow-up   Aortic atherosclerosis/coronary calcifications -Outpatient follow-up with cardiology   Physical deconditioning -PT recommended home health PT  Discharge Instructions  Discharge Instructions     Ambulatory referral to Cardiology   Complete by: As directed    Ambulatory referral to Gastroenterology   Complete by: As directed    What is the reason for referral?: Other   Diet - low sodium heart healthy   Complete by: As directed    Increase activity slowly   Complete by: As directed       Allergies as of 06/13/2022       Reactions  Actonel [risedronate] Other (See Comments)   Lowered BP   Buspar [buspirone] Other (See Comments)   Increased risk for  Afib   Sertraline Diarrhea        Medication List     TAKE these medications    acetaminophen 500 MG tablet Commonly known as: TYLENOL Take 500-1,000 mg by mouth every 6 (six) hours as needed (pain.).   apixaban 5 MG Tabs tablet Commonly known as: ELIQUIS Take 1 tablet (5 mg total) by mouth 2 (two) times daily. Resume from 06/14/2022 as per cardiology/GI recommendations Start taking on: Jun 14, 2022 What changed: additional instructions   CENTRUM SILVER PO Take 1 tablet by mouth daily.   ferrous sulfate 325 (65 FE) MG tablet Take 325 mg by mouth daily at 12 noon.   lisinopril 10 MG tablet Commonly known as: ZESTRIL Take 1 tablet (10 mg total) by mouth daily. What changed: when to take this   loperamide 2 MG capsule Commonly known as: IMODIUM Take 2-4 mg by mouth 4 (four) times daily as needed for diarrhea or loose stools.   loratadine 5 MG chewable tablet Commonly known as: CLARITIN Chew 5 mg by mouth daily.   Magnesium 250 MG Tabs Take 250 mg by mouth 3 (three) times a week.   ORAL ELECTROLYTES PO Take 1 tablet by mouth daily as needed (electrolytes replenishment).   ORAL ELECTROLYTES PO Take 500 mLs by mouth as needed (hydration). Nuun Sport Hydration Electrolyte Powder Drink Mix   pantoprazole 40 MG tablet Commonly known as: PROTONIX Take 1 tablet (40 mg total) by mouth daily.   Vitamin D3 Super Strength 50 MCG (2000 UT) Tabs Generic drug: Cholecalciferol Take 2,000 Units by mouth 4 (four) times a week.          Follow-up Information     Carlos Levering, NP Follow up.   Specialty: Cardiology Why: Hospital follow-up with Cardiology scheduled for 06/22/2022 at 10:05am. Please arrive 15 minutes early for check-in. If this date/time does not work for you, please call our office to reschedule. Contact information: [address] [place] [address] [phone]         Thana Ates, MD. Schedule an appointment as soon as  possible for a visit in 1 week(s).   Specialty: Internal Medicine Why: with repeat cbc/bmp Contact information: [address] [employer] [place] [address] [phone]                Allergies  Allergen Reactions   Actonel [Risedronate] Other (See Comments)    Lowered BP   Buspar [Buspirone] Other (See Comments)    Increased risk for Afib   Sertraline Diarrhea    Consultations: Cardiology/EP/GI   Procedures/Studies: DG Chest Portable 1 View  Result Date: 06/09/2022 CLINICAL DATA:  Bradycardia EXAM: PORTABLE CHEST 1 VIEW COMPARISON:  X-ray 05/09/2022. FINDINGS: Small bilateral pleural effusions with some adjacent opacities, similar to previous. Mild thickening along the minor fissure. Stable cardiopericardial silhouette. No edema or pneumothorax. Surgical clips in the upper thorax. Dense left-sided lung nodules consistent with old granulomatous disease. Degenerative changes of the spine. Overlapping cardiac leads and defibrillator pads. IMPRESSION: Stable x-ray. Small effusions and adjacent opacities. Chronic changes elsewhere Electronically Signed   By: Karen Kays M.D.   On: 06/09/2022 16:51   EP STUDY  Result Date: 06/07/2022 See surgical note for result.   EGD on 06/11/2022 Impression:               - Normal  larynx.                           - Normal esophagus except for mild Schatzki ring.                           - Normal stomach.                           - Blood in the second portion of the duodenum.                           - A single bleeding angioectasia in the duodenum.                            Treated with argon plasma coagulation (APC). Clips                            (MR conditional) were placed. Clip manufacturer:                            [employer].                           - No specimens collected. Recommendation:           - Return patient to hospital ward for ongoing care.                           - Resume regular diet.                            - This patient would benefit from intravenous iron                            prior to discharge. She will also then need oral                            iron post discharge.                           After discharge, hemoglobin should be checked a                            week later by primary care or cardiology.                           If OAC needs to be resumed after recent DCCV for                            A-fib, it can be resumed 3 days from now at regular                            dose.  Pantoprazole 40 mg once daily for 14 days.  Subjective: Patient seen and examined at bedside.  Feels better and wants to monitor.  Denies worsening shortness of breath, nausea, vomiting.  Tolerating diet.  Discharge Exam: Vitals:   06/13/22 0421 06/13/22 0719  BP: (!) 105/54 115/61  Pulse: 80 77  Resp: 18 18  Temp: 98 F (36.7 C) 97.8 F (36.6 C)  SpO2: 90% 91%    General: Pt is alert, awake, not in acute distress.  Elderly female lying in bed.  Looks chronically ill and deconditioned.  On room air. Cardiovascular: rate controlled, S1/S2 + Respiratory: bilateral decreased breath sounds at bases with some scattered crackles Abdominal: Soft, NT, ND, bowel sounds +, ostomy present Extremities: Trace lower extremity edema present; no cyanosis    The results of significant diagnostics from this hospitalization (including imaging, microbiology, ancillary and laboratory) are listed below for reference.     Microbiology: Recent Results (from the past 240 hour(s))  Urine Culture (for pregnant, neutropenic or urologic patients or patients with an indwelling urinary catheter)     Status: Abnormal   Collection Time: 06/10/22 10:24 AM   Specimen: Urine, Clean Catch  Result Value Ref Range Status   Specimen Description URINE, CLEAN CATCH  Final   Special Requests NONE  Final   Culture (A)  Final    <10,000 COLONIES/mL INSIGNIFICANT  GROWTH Performed at [facility], [address], [place], [place] [address]    Report Status 06/11/2022 FINAL  Final     Labs: BNP (last 3 results) No results for input(s): "BNP" in the last 8760 hours. Basic Metabolic Panel: Recent Labs  Lab 06/09/22 1626 06/09/22 1638 06/09/22 2150 06/10/22 1036 06/11/22 0053 06/12/22 0053 06/13/22 0058  NA 128* 126*  --  132* 131* 134* 134*  K 4.6 5.0  --  4.6 4.2 4.1 3.8  CL 95* 99  --  99 100 106 103  CO2 23  --   --  23 22 21* 22  GLUCOSE 103* 105*  --  80 62* 83 83  BUN 44* 55*  --  37* 30* 25* 22  CREATININE 0.83 0.90 0.83 0.77 0.77 0.74 0.86  CALCIUM 9.6  --   --  9.0 8.5* 8.2* 8.6*  MG 2.2  --   --  1.9 1.7 1.8 1.7   Liver Function Tests: Recent Labs  Lab 06/09/22 1626 06/11/22 0053  AST 41 36  ALT 41 35  ALKPHOS 82 67  BILITOT 0.4 1.2  PROT 7.0 6.2*  ALBUMIN 3.6 3.1*   No results for input(s): "LIPASE", "AMYLASE" in the last 168 hours. No results for input(s): "AMMONIA" in the last 168 hours. CBC: Recent Labs  Lab 06/09/22 1626 06/09/22 1638 06/10/22 1036 06/10/22 1751 06/11/22 0053 06/12/22 0053 06/13/22 0058  WBC 5.7   < > 5.8 6.2 5.6 7.0 8.5  NEUTROABS 4.0  --  4.4  --  3.9 5.4 6.2  HGB 7.9*   < > 8.4* 8.9* 7.9* 8.2* 7.7*  HCT 25.4*   < > 26.7* 27.6* 25.1* 25.9* 24.5*  MCV 88.8   < > 85.6 84.1 85.4 85.5 86.3  PLT 211   < > 192 197 182 192 189   < > = values in this interval not displayed.   Cardiac Enzymes: No results for input(s): "CKTOTAL", "CKMB", "CKMBINDEX", "TROPONINI" in the last 168 hours. BNP: Invalid input(s): "POCBNP" CBG: No results for input(s): "GLUCAP" in the last 168 hours. D-Dimer No results  for input(s): "DDIMER" in the last 72 hours. Hgb A1c No results for input(s): "HGBA1C" in the last 72 hours. Lipid Profile Recent Labs    06/10/22 1024  CHOL 189  HDL 75  LDLCALC 98  TRIG 80  CHOLHDL 2.5   Thyroid function studies Recent Labs    06/10/22 1036  TSH 2.662    Anemia work up No results for input(s): "VITAMINB12", "FOLATE", "FERRITIN", "TIBC", "IRON", "RETICCTPCT" in the last 72 hours. Urinalysis    Component Value Date/Time   COLORURINE STRAW (A) 06/10/2022 1024   APPEARANCEUR CLEAR 06/10/2022 1024   LABSPEC 1.012 06/10/2022 1024   PHURINE 5.0 06/10/2022 1024   GLUCOSEU NEGATIVE 06/10/2022 1024   HGBUR NEGATIVE 06/10/2022 1024   BILIRUBINUR NEGATIVE 06/10/2022 1024   KETONESUR NEGATIVE 06/10/2022 1024   PROTEINUR NEGATIVE 06/10/2022 1024   NITRITE NEGATIVE 06/10/2022 1024   LEUKOCYTESUR NEGATIVE 06/10/2022 1024   Sepsis Labs Recent Labs  Lab 06/10/22 1751 06/11/22 0053 06/12/22 0053 06/13/22 0058  WBC 6.2 5.6 7.0 8.5   Microbiology Recent Results (from the past 240 hour(s))  Urine Culture (for pregnant, neutropenic or urologic patients or patients with an indwelling urinary catheter)     Status: Abnormal   Collection Time: 06/10/22 10:24 AM   Specimen: Urine, Clean Catch  Result Value Ref Range Status   Specimen Description URINE, CLEAN CATCH  Final   Special Requests NONE  Final   Culture (A)  Final    <10,000 COLONIES/mL INSIGNIFICANT GROWTH Performed at [facility], [address] [place], [place] [address]    Report Status 06/11/2022 FINAL  Final     Time coordinating discharge: 35 minutes  SIGNED:   Glade Lloyd, MD  [facility] 06/13/2022, 8:55 AM

## 2022-06-13 NOTE — Plan of Care (Signed)

## 2022-06-13 NOTE — Progress Notes (Signed)
RNCM received HHPT order.  RNCM spoke with patient's daughter who states patient is active with [facility] and would like to continue with their services.  Barbara Cower at [employer] contacted with order.  Patient's daughter states she will contact [facility] as well.  Confirmed contact information.  Main contact is daughter, Sandy Trujillo.

## 2022-06-14 DIAGNOSIS — E213 Hyperparathyroidism, unspecified: Secondary | ICD-10-CM | POA: Diagnosis not present

## 2022-06-14 DIAGNOSIS — E559 Vitamin D deficiency, unspecified: Secondary | ICD-10-CM | POA: Diagnosis not present

## 2022-06-14 DIAGNOSIS — K5090 Crohn's disease, unspecified, without complications: Secondary | ICD-10-CM | POA: Diagnosis not present

## 2022-06-14 DIAGNOSIS — I10 Essential (primary) hypertension: Secondary | ICD-10-CM | POA: Diagnosis not present

## 2022-06-14 DIAGNOSIS — I4891 Unspecified atrial fibrillation: Secondary | ICD-10-CM | POA: Diagnosis not present

## 2022-06-14 DIAGNOSIS — E441 Mild protein-calorie malnutrition: Secondary | ICD-10-CM | POA: Diagnosis not present

## 2022-06-14 NOTE — Anesthesia Postprocedure Evaluation (Signed)
Anesthesia Post Note  Patient: ALIKA CORNEY  Procedure(s) Performed: ESOPHAGOGASTRODUODENOSCOPY (EGD) WITH PROPOFOL HOT HEMOSTASIS (ARGON PLASMA COAGULATION/BICAP) HEMOSTASIS CLIP PLACEMENT     Patient location during evaluation: Endoscopy Anesthesia Type: MAC Level of consciousness: awake Pain management: pain level controlled Vital Signs Assessment: post-procedure vital signs reviewed and stable Respiratory status: spontaneous breathing, nonlabored ventilation and respiratory function stable Cardiovascular status: blood pressure returned to baseline and stable Postop Assessment: no apparent nausea or vomiting Anesthetic complications: no   No notable events documented.  Last Vitals:  Vitals:   06/13/22 0719 06/13/22 1014  BP: 115/61 137/66  Pulse: 77   Resp: 18   Temp: 36.6 C   SpO2: 91%     Last Pain:  Vitals:   06/13/22 1058  TempSrc:   PainSc: 0-No pain   Pain Goal:                   Catheryn Bacon Shereta Crothers

## 2022-06-16 ENCOUNTER — Encounter (HOSPITAL_BASED_OUTPATIENT_CLINIC_OR_DEPARTMENT_OTHER): Payer: Medicare Other | Admitting: Cardiology

## 2022-06-16 DIAGNOSIS — I10 Essential (primary) hypertension: Secondary | ICD-10-CM | POA: Diagnosis not present

## 2022-06-16 DIAGNOSIS — E559 Vitamin D deficiency, unspecified: Secondary | ICD-10-CM | POA: Diagnosis not present

## 2022-06-16 DIAGNOSIS — E213 Hyperparathyroidism, unspecified: Secondary | ICD-10-CM | POA: Diagnosis not present

## 2022-06-16 DIAGNOSIS — K5090 Crohn's disease, unspecified, without complications: Secondary | ICD-10-CM | POA: Diagnosis not present

## 2022-06-16 DIAGNOSIS — E441 Mild protein-calorie malnutrition: Secondary | ICD-10-CM | POA: Diagnosis not present

## 2022-06-16 DIAGNOSIS — I4891 Unspecified atrial fibrillation: Secondary | ICD-10-CM | POA: Diagnosis not present

## 2022-06-17 DIAGNOSIS — K5090 Crohn's disease, unspecified, without complications: Secondary | ICD-10-CM | POA: Diagnosis not present

## 2022-06-17 DIAGNOSIS — E441 Mild protein-calorie malnutrition: Secondary | ICD-10-CM | POA: Diagnosis not present

## 2022-06-17 DIAGNOSIS — K922 Gastrointestinal hemorrhage, unspecified: Secondary | ICD-10-CM | POA: Diagnosis not present

## 2022-06-17 DIAGNOSIS — E213 Hyperparathyroidism, unspecified: Secondary | ICD-10-CM | POA: Diagnosis not present

## 2022-06-17 DIAGNOSIS — E871 Hypo-osmolality and hyponatremia: Secondary | ICD-10-CM | POA: Diagnosis not present

## 2022-06-17 DIAGNOSIS — D649 Anemia, unspecified: Secondary | ICD-10-CM | POA: Diagnosis not present

## 2022-06-17 DIAGNOSIS — I10 Essential (primary) hypertension: Secondary | ICD-10-CM | POA: Diagnosis not present

## 2022-06-17 DIAGNOSIS — I4891 Unspecified atrial fibrillation: Secondary | ICD-10-CM | POA: Diagnosis not present

## 2022-06-17 DIAGNOSIS — F411 Generalized anxiety disorder: Secondary | ICD-10-CM | POA: Diagnosis not present

## 2022-06-17 DIAGNOSIS — E559 Vitamin D deficiency, unspecified: Secondary | ICD-10-CM | POA: Diagnosis not present

## 2022-06-19 NOTE — Progress Notes (Unsigned)
Cardiology Clinic Note   Date: 06/22/2022 ID: CHENEA KIPPER, DOB 1933/01/09, MRN [id number]  Primary Cardiologist:  Reatha Harps, MD  Patient Profile    Sandy Trujillo is a 89 y.o. female who presents to the clinic today for hospital follow-up.  Past medical history significant for: PAF. Echo 05/10/2022: EF 50 to 55%.  Mildly reduced RV function.  Mild RVH.  Mildly elevated PA pressure.  Severe LAE.  Moderate RAE.  Moderate MR.  Moderate TR.  Mild calcification/thickening of aortic valve without stenosis. DCCV 06/07/2022: Rhythm following was sinus bradycardia with intermittent Wenckebach and runs of A. tach. Bradycardia. Hypertension. Hyperlipidemia. Lipid panel 06/10/2022: LDL 98, HDL 75, TG 80, total 189. AVM of the small bowel. Rectal carcinoma. S/p colectomy. Ulcerative colitis.   History of Present Illness    Sandy Trujillo was first evaluated by cardiology on 05/10/2022 during hospitalization for new onset A-fib.  Patient was sent to the ED by [facility] health urgent care after she presented with complaints of her Apple Watch reporting A-fib over several days prior.  Patient not started on beta-blocker or calcium channel blocker secondary to stable rate control and nocturnal bradycardia (not new per her Apple Watch data).  She was started on Eliquis and instructed to follow-up as an outpatient. At outpatient visit with [place] Bells, PA-C on 05/25/2022 she remained in A-fib and DCCV was scheduled.  She underwent successful DCCV on 06/07/2022 and converted to sinus bradycardia with intermittent Wenckebach and runs of A. tach.  At follow-up visit with A-fib clinic on 06/09/2022 she reported symptomatic bradycardia with presyncope and was sent to ED for admission and consideration of urgent PPM placement.  She was found to be anemic with hemoglobin 7.9 and then dropped to 6.6 on 06/09/2022.  She was transfused 1 unit of blood.  She was evaluated by GI and found to have AVM of the  small bowel and anticoagulation was held until 06/14/2022.  It was decided to hold off on pacemaker placement and follow closely as an outpatient.  She was discharged on 06/13/2022.  Today, patient is accompanied by her daughters.  She normally lives alone but has had family staying with her since she was discharged from the hospital.  Family reports she has been more anxious than usual.  They are concerned because there are days patient does not want to do much.  They provide an example from over the weekend when patient wanted to stay in her pajamas and did not want to get up.  However, the next day "we had a great day."  They are uncertain if they should "push her" to participate in activities even if she says she does not want to.  Patient reports she has been feeling more anxious and depressed.  She does feel as though her heart races when she is anxious.  She reports edema of bilateral feet on Saturday but admits that she sat much of the day with her feet in a dependent position.  She does not wear compression hose and will raise her feet with a recliner but does not necessarily elevate them.  She denies shortness of breath, dyspnea with exertion, orthopnea, or PND.  She had her labs rechecked with PCP and hemoglobin was increased to 8.0.  She will see her PCP again next week.  She denies blood in stool or urine or other bleeding concerns.   ROS: All other systems reviewed and are otherwise negative except as noted in History of Present  Illness.  Studies Reviewed    ECG personally reviewed by me today: Atrial fibrillation, 79 bpm.  No significant changes from 06/11/2022.  Risk Assessment/Calculations     CHA2DS2-VASc Score = 5   This indicates a 7.2% annual risk of stroke. The patient's score is based upon: CHF History: 0 HTN History: 1 Diabetes History: 0 Stroke History: 0 Vascular Disease History: 1 Age Score: 2 Gender Score: 1             Physical Exam    VS:  BP 102/74   Pulse  84   Ht 5' (1.524 m)   Wt 129 lb 3.2 oz (58.6 kg)   SpO2 96%   BMI 25.23 kg/m  , BMI Body mass index is 25.23 kg/m.  GEN: Well nourished, well developed, in no acute distress. Neck: No JVD or carotid bruits. Cardiac: Irregular rhythm.. No murmurs. No rubs or gallops.   Respiratory:  Respirations regular and unlabored. Clear to auscultation without rales, wheezing or rhonchi. GI: Soft, nontender, nondistended. Extremities: Radials/DP/PT 2+ and equal bilaterally. No clubbing or cyanosis.  Mild edema bilateral ankles. Skin: Warm and dry, no rash. Neuro: Strength intact.  Assessment & Plan    PAF.  Onset April 2024.  DCCV May 2024 with conversion to sinus bradycardia with intermittent Wenckebach and runs of A. tach.  Patient is in A-fib today with a rate of 79 bpm.  She does feel as though her heart races when she is anxious.  Denies spontaneous bleeding concerns.  Continue Eliquis.  She will see Dr. Nelly Laurence next week. Anxiety/depression.  This seems to be the patient's biggest concern today.  Family is very concerned about the times that she wants to stay in bed and not participate in activities.  Discussed with patient's family and patient that it is okay to encourage her to participate in activities but allow her to rest if she feels tired.  Patient admits to increased depression and anxiety.  She does not feel hydroxyzine works as well as the Xanax was.  She is going to discuss with her PCP next week possibly going back on Xanax while another SSRI is tried (previous SSRI caused diarrhea). Hypertension. BP today 102/74.  Patient denies headaches, dizziness or vision changes. Continue lisinopril.  Disposition: Keep previously scheduled appointment with Dr. Nelly Laurence.  Return in 3 months or sooner as needed.         Signed, Etta Grandchild. Vedha Tercero, DNP, NP-C

## 2022-06-21 DIAGNOSIS — I4819 Other persistent atrial fibrillation: Secondary | ICD-10-CM | POA: Diagnosis not present

## 2022-06-21 DIAGNOSIS — K5090 Crohn's disease, unspecified, without complications: Secondary | ICD-10-CM | POA: Diagnosis not present

## 2022-06-21 DIAGNOSIS — I441 Atrioventricular block, second degree: Secondary | ICD-10-CM | POA: Diagnosis not present

## 2022-06-21 DIAGNOSIS — E871 Hypo-osmolality and hyponatremia: Secondary | ICD-10-CM | POA: Diagnosis not present

## 2022-06-21 DIAGNOSIS — I10 Essential (primary) hypertension: Secondary | ICD-10-CM | POA: Diagnosis not present

## 2022-06-21 DIAGNOSIS — D500 Iron deficiency anemia secondary to blood loss (chronic): Secondary | ICD-10-CM | POA: Diagnosis not present

## 2022-06-21 DIAGNOSIS — E042 Nontoxic multinodular goiter: Secondary | ICD-10-CM | POA: Diagnosis not present

## 2022-06-21 DIAGNOSIS — K219 Gastro-esophageal reflux disease without esophagitis: Secondary | ICD-10-CM | POA: Diagnosis not present

## 2022-06-21 DIAGNOSIS — E441 Mild protein-calorie malnutrition: Secondary | ICD-10-CM | POA: Diagnosis not present

## 2022-06-21 DIAGNOSIS — Z7901 Long term (current) use of anticoagulants: Secondary | ICD-10-CM | POA: Diagnosis not present

## 2022-06-21 DIAGNOSIS — E559 Vitamin D deficiency, unspecified: Secondary | ICD-10-CM | POA: Diagnosis not present

## 2022-06-21 DIAGNOSIS — E213 Hyperparathyroidism, unspecified: Secondary | ICD-10-CM | POA: Diagnosis not present

## 2022-06-21 DIAGNOSIS — F411 Generalized anxiety disorder: Secondary | ICD-10-CM | POA: Diagnosis not present

## 2022-06-21 DIAGNOSIS — H00014 Hordeolum externum left upper eyelid: Secondary | ICD-10-CM | POA: Diagnosis not present

## 2022-06-21 DIAGNOSIS — M109 Gout, unspecified: Secondary | ICD-10-CM | POA: Diagnosis not present

## 2022-06-21 DIAGNOSIS — M810 Age-related osteoporosis without current pathological fracture: Secondary | ICD-10-CM | POA: Diagnosis not present

## 2022-06-22 ENCOUNTER — Encounter: Payer: Self-pay | Admitting: Student

## 2022-06-22 ENCOUNTER — Ambulatory Visit: Payer: Medicare Other | Attending: Student | Admitting: Student

## 2022-06-22 VITALS — BP 102/74 | HR 84 | Ht 60.0 in | Wt 129.2 lb

## 2022-06-22 DIAGNOSIS — I4819 Other persistent atrial fibrillation: Secondary | ICD-10-CM | POA: Diagnosis not present

## 2022-06-22 DIAGNOSIS — I480 Paroxysmal atrial fibrillation: Secondary | ICD-10-CM | POA: Diagnosis not present

## 2022-06-22 DIAGNOSIS — F411 Generalized anxiety disorder: Secondary | ICD-10-CM | POA: Insufficient documentation

## 2022-06-22 DIAGNOSIS — F32A Depression, unspecified: Secondary | ICD-10-CM | POA: Insufficient documentation

## 2022-06-22 DIAGNOSIS — D6869 Other thrombophilia: Secondary | ICD-10-CM | POA: Diagnosis not present

## 2022-06-22 DIAGNOSIS — I10 Essential (primary) hypertension: Secondary | ICD-10-CM | POA: Diagnosis not present

## 2022-06-22 NOTE — Patient Instructions (Signed)
Medication Instructions:  No changes *If you need a refill on your cardiac medications before your next appointment, please call your pharmacy*  Follow-Up: At [facility], you and your health needs are our priority.  As part of our continuing mission to provide you with exceptional heart care, we have created designated Provider Care Teams.  These Care Teams include your primary Cardiologist (physician) and Advanced Practice Providers (APPs -  Physician Assistants and Nurse Practitioners) who all work together to provide you with the care you need, when you need it.  We recommend signing up for the patient portal called "MyChart".  Sign up information is provided on this After Visit Summary.  MyChart is used to connect with patients for Virtual Visits (Telemedicine).  Patients are able to view lab/test results, encounter notes, upcoming appointments, etc.  Non-urgent messages can be sent to your provider as well.   To learn more about what you can do with MyChart, go to [email or web address].    Your next appointment:   3 month(s)  Provider:   Dr Flora Lipps

## 2022-06-23 DIAGNOSIS — E559 Vitamin D deficiency, unspecified: Secondary | ICD-10-CM | POA: Diagnosis not present

## 2022-06-23 DIAGNOSIS — I4819 Other persistent atrial fibrillation: Secondary | ICD-10-CM | POA: Diagnosis not present

## 2022-06-23 DIAGNOSIS — E441 Mild protein-calorie malnutrition: Secondary | ICD-10-CM | POA: Diagnosis not present

## 2022-06-23 DIAGNOSIS — E213 Hyperparathyroidism, unspecified: Secondary | ICD-10-CM | POA: Diagnosis not present

## 2022-06-23 DIAGNOSIS — I10 Essential (primary) hypertension: Secondary | ICD-10-CM | POA: Diagnosis not present

## 2022-06-23 DIAGNOSIS — K5090 Crohn's disease, unspecified, without complications: Secondary | ICD-10-CM | POA: Diagnosis not present

## 2022-06-24 DIAGNOSIS — E213 Hyperparathyroidism, unspecified: Secondary | ICD-10-CM | POA: Diagnosis not present

## 2022-06-24 DIAGNOSIS — K5090 Crohn's disease, unspecified, without complications: Secondary | ICD-10-CM | POA: Diagnosis not present

## 2022-06-24 DIAGNOSIS — E559 Vitamin D deficiency, unspecified: Secondary | ICD-10-CM | POA: Diagnosis not present

## 2022-06-24 DIAGNOSIS — I4819 Other persistent atrial fibrillation: Secondary | ICD-10-CM | POA: Diagnosis not present

## 2022-06-24 DIAGNOSIS — I10 Essential (primary) hypertension: Secondary | ICD-10-CM | POA: Diagnosis not present

## 2022-06-24 DIAGNOSIS — E441 Mild protein-calorie malnutrition: Secondary | ICD-10-CM | POA: Diagnosis not present

## 2022-06-25 ENCOUNTER — Telehealth: Payer: Self-pay | Admitting: Student

## 2022-06-25 DIAGNOSIS — F411 Generalized anxiety disorder: Secondary | ICD-10-CM | POA: Diagnosis not present

## 2022-06-25 NOTE — Telephone Encounter (Signed)
Patient's daughter called to say that her mother's caregiver called her to tell her that her mother is not feeling well today.  She is very tired.  She has not really eaten today only had a cup of coffee, 1/2 of a banana, a 1/4 a a english muffin.  Has been in her recliner chair all day.  It's different than how she was feeling yesterday.  Patient was seen on 5/28 by Gavin Pound.  Patient does have appt with Dr. Nelly Laurence on Monday.

## 2022-06-25 NOTE — Telephone Encounter (Signed)
Spoke with pt's daughter. She denies that the pt has any pain, sob, dizziness, light-headedness or fainting. Says that she is just not eating or drinking much and is not active at all.  Discussed how low blood sugar and also dehydration can cause low energy levels as well.  Given ER precautions. She feels reassured and verbalized understanding.

## 2022-06-25 NOTE — Telephone Encounter (Signed)
Returned call; no answer, left message and call back number.

## 2022-06-28 ENCOUNTER — Ambulatory Visit: Payer: Medicare Other | Attending: Cardiovascular Disease | Admitting: Cardiovascular Disease

## 2022-06-28 ENCOUNTER — Ambulatory Visit: Payer: Medicare Other | Attending: Cardiovascular Disease

## 2022-06-28 ENCOUNTER — Encounter: Payer: Self-pay | Admitting: Cardiovascular Disease

## 2022-06-28 VITALS — BP 122/70 | HR 63 | Ht 60.0 in | Wt 131.4 lb

## 2022-06-28 DIAGNOSIS — I4819 Other persistent atrial fibrillation: Secondary | ICD-10-CM

## 2022-06-28 DIAGNOSIS — I480 Paroxysmal atrial fibrillation: Secondary | ICD-10-CM

## 2022-06-28 NOTE — Progress Notes (Signed)
Electrophysiology Office Note:    Date:  06/28/2022   ID:  Sandy Trujillo, DOB 09/25/1932, MRN [id number]  PCP:  Thana Ates, MD   [facility] Providers Cardiologist:  Reatha Harps, MD     Referring MD: Thana Ates, MD   History of Present Illness:    Sandy Trujillo is a 89 y.o. female with a hx listed below, significant for persistent atrial fibrillation; second degree AV block, Mobitz I; sick sinus syndrome who presents for electrophysiology follow-up.  She was admitted in April 2024 for atrial fibrillation.  She was not terribly symptomatic but notified of the arrhythmia by her Apple Watch.  She was discharged in rate controlled atrial fibrillation with no AV nodal blockers on board.  She was treated with Eliquis 5 mg twice daily for CHA2DS2-VASc score of 5.  In follow-up in atrial fibrillation clinic she was in rate controlled atrial fibrillation with fatigue and occasional shortness of breath.  She was admitted a few weeks ago in May 2024 with bradycardia after DC cardioversion to sinus rate cardia.  She had sinus bradycardia and Mobitz 1 block, occasionally an accelerated junctional rhythm.  She was not very symptomatic with the bradycardia.  She was also noted to have acute anemia with a significant drop in hemoglobin (9.7 to 6.6).  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  The patient's daughters are present with her today.  History is obtained from 2 daughters and the patient.  A year ago, the patient was doing very well ambulatory and highly functional.  She lives by herself.  Since January, she has been dealing with a lot of stress and has been trying various antidepressant medications.  Some of these resulted in diarrhea and functional decline.  She had just started BuSpar prior to the onset of her atrial fibrillation.  The patient reports that she was not at all symptomatic with atrial fibrillation and only well aware of it because of an  medication from her Apple Watch.  She and the daughters report that she was doing reasonably well until her cardioversion, and they cannot point to a clear decline in her functional capacity between the detection of atrial fibrillation and her cardioversion.  However, she is fared worse since her hospitalization.  She now uses a wheelchair, is very easily fatigued, and has poor motivation.  She used to manage her ostomy independently, but now is progressively needing some help.  Past Medical History:  Diagnosis Date   Anemia    Anxiety    Bell's palsy    GERD (gastroesophageal reflux disease)    Hyperparathyroidism (HCC) 2019   Hypertension    Ileostomy in place [facility])    Osteoporosis 2019   Pneumonia    hx of   rectal ca 2007   surg only    Past Surgical History:  Procedure Laterality Date   ABDOMINAL HYSTERECTOMY     BREAST EXCISIONAL BIOPSY Bilateral    benign   CARDIOVERSION N/A 06/07/2022   Procedure: CARDIOVERSION;  Surgeon: Sande Rives, MD;  Location: [facility] INVASIVE CV LAB;  Service: Cardiovascular;  Laterality: N/A;   COLON SURGERY  2007   ESOPHAGOGASTRODUODENOSCOPY (EGD) WITH PROPOFOL N/A 06/11/2022   Procedure: ESOPHAGOGASTRODUODENOSCOPY (EGD) WITH PROPOFOL;  Surgeon: Sherrilyn Rist, MD;  Location: [facility] ENDOSCOPY;  Service: Gastroenterology;  Laterality: N/A;   HEMOSTASIS CLIP PLACEMENT  06/11/2022   Procedure: HEMOSTASIS CLIP PLACEMENT;  Surgeon: Sherrilyn Rist, MD;  Location: [facility] ENDOSCOPY;  Service: Gastroenterology;;   HOT HEMOSTASIS N/A 06/11/2022   Procedure: HOT HEMOSTASIS (ARGON PLASMA COAGULATION/BICAP);  Surgeon: Sherrilyn Rist, MD;  Location: [facility] ENDOSCOPY;  Service: Gastroenterology;  Laterality: N/A;   PARATHYROIDECTOMY N/A 05/26/2021   Procedure: NECK EXPLORATION WITH RIGHT INFERIOR PARATHYROIDECTOMY;  Surgeon: Darnell Level, MD;  Location: [facility] ORS;  Service: General;  Laterality: N/A;   surgery for spin al stenosis      bone spur touching nerve per pt     Current Medications: Current Meds  Medication Sig   acetaminophen (TYLENOL) 500 MG tablet Take 500-1,000 mg by mouth every 6 (six) hours as needed (pain.).   ALPRAZolam (XANAX) 0.25 MG tablet Take 0.25 mg by mouth 2 (two) times daily as needed.   apixaban (ELIQUIS) 5 MG TABS tablet Take 1 tablet (5 mg total) by mouth 2 (two) times daily. Resume from 06/14/2022 as per cardiology/GI recommendations   Cholecalciferol (VITAMIN D3 SUPER STRENGTH) 50 MCG (2000 UT) TABS Take 2,000 Units by mouth 4 (four) times a week.   ferrous sulfate 325 (65 FE) MG tablet Take 325 mg by mouth daily at 12 noon.   lisinopril (ZESTRIL) 10 MG tablet Take 1 tablet (10 mg total) by mouth daily.   loperamide (IMODIUM) 2 MG capsule Take 2-4 mg by mouth 4 (four) times daily as needed for diarrhea or loose stools.   loratadine (CLARITIN) 5 MG chewable tablet Chew 5 mg by mouth daily.   Magnesium 250 MG TABS Take 250 mg by mouth 3 (three) times a week.   Multiple Vitamins-Minerals (CENTRUM SILVER PO) Take 1 tablet by mouth daily.   ORAL ELECTROLYTES PO Take 1 tablet by mouth daily as needed (electrolytes replenishment).   pantoprazole (PROTONIX) 40 MG tablet Take 1 tablet (40 mg total) by mouth daily.     Allergies:   Actonel [risedronate], Buspar [buspirone], and Sertraline   Social and Family History: Reviewed in Epic  ROS:   Please see the history of present illness.    All other systems reviewed and are negative.  EKGs/Labs/Other Studies Reviewed Today:    Echocardiogram:  TTE 05/10/2022 EF 50-55%, severely dilated LA, RA moderately dilated   Monitors:  ordered  Stress testing:   Advanced imaging:   Cardiac catherization   EKG:  Last EKG results: today - atrial fibrillaiton, 69 bpm   Recent Labs: 06/10/2022: TSH 2.662 06/11/2022: ALT 35 06/13/2022: BUN 22; Creatinine, Ser 0.86; Hemoglobin 7.7; Magnesium 1.7; Platelets 189; Potassium 3.8; Sodium 134     Physical Exam:    VS:  BP 122/70    Pulse 63   Ht 5' (1.524 m)   Wt 131 lb 6.4 oz (59.6 kg)   SpO2 94%   BMI 25.66 kg/m     Wt Readings from Last 3 Encounters:  06/28/22 131 lb 6.4 oz (59.6 kg)  06/22/22 129 lb 3.2 oz (58.6 kg)  06/13/22 133 lb 2.5 oz (60.4 kg)     GEN:  Well nourished, well developed in no acute distress; in wheel chair. Appears ill CARDIAC: iRRR, no murmurs, rubs, gallops RESPIRATORY:  Normal work of breathing MUSCULOSKELETAL: no edema    ASSESSMENT & PLAN:    Sinus node dysfunction After DCCV, during admission with GI bleed She did not have problems with sinus node dysfunction prior to onset of AF  Atrial fibrillation Rates are controlled, normal ventricular rate today Left atrium is severely enlarged. Rhythm control would require placement of a permanent pacemaker due to sick  sinus syndrome At age 89, with recent decline in functional status after hospitalization for bradycardia and acute GI bleed, the patient hesitant to undergo additional procedures Will place monitor to evaluate heart rates in AF.  Functional decline Attributable to advanced age, recent hospitalization. The degree to which AF is contributing is uncertain. Daughters note some progressive decline since January          Medication Adjustments/Labs and Tests Ordered: Current medicines are reviewed at length with the patient today.  Concerns regarding medicines are outlined above.  No orders of the defined types were placed in this encounter.  No orders of the defined types were placed in this encounter.    Signed, Maurice Small, MD  06/28/2022 4:03 PM     [facility]

## 2022-06-28 NOTE — Patient Instructions (Signed)
Medication Instructions:  Your physician recommends that you continue on your current medications as directed. Please refer to the Current Medication list given to you today. *If you need a refill on your cardiac medications before your next appointment, please call your pharmacy*   Testing/Procedures: Zio Cardiac Monitor Your physician has recommended that you wear an event monitor. Event monitors are medical devices that record the heart's electrical activity. Doctors most often us these monitors to diagnose arrhythmias. Arrhythmias are problems with the speed or rhythm of the heartbeat. The monitor is a small, portable device. You can wear one while you do your normal daily activities. This is usually used to diagnose what is causing palpitations/syncope (passing out).    Follow-Up: At  [facility], you and your health needs are our priority.  As part of our continuing mission to provide you with exceptional heart care, we have created designated Provider Care Teams.  These Care Teams include your primary Cardiologist (physician) and Advanced Practice Providers (APPs -  Physician Assistants and Nurse Practitioners) who all work together to provide you with the care you need, when you need it.  We recommend signing up for the patient portal called "MyChart".  Sign up information is provided on this After Visit Summary.  MyChart is used to connect with patients for Virtual Visits (Telemedicine).  Patients are able to view lab/test results, encounter notes, upcoming appointments, etc.  Non-urgent messages can be sent to your provider as well.   To learn more about what you can do with MyChart, go to [email or web address].    Your next appointment:   6-8 week(s)  Provider:   Augustus Mealor, MD   ZIO XT- Long Term Monitor Instructions  Your physician has requested you wear a ZIO patch monitor for 14 days.  This is a single patch monitor. [employer] supplies one patch monitor per  enrollment. Additional stickers are not available. Please do not apply patch if you will be having a Nuclear Stress Test,  Echocardiogram, Cardiac CT, MRI, or Chest Xray during the period you would be wearing the  monitor. The patch cannot be worn during these tests. You cannot remove and re-apply the  ZIO XT patch monitor.  Your ZIO patch monitor will be mailed 3 day USPS to your address on file. It may take 3-5 days  to receive your monitor after you have been enrolled.  Once you have received your monitor, please review the enclosed instructions. Your monitor  has already been registered assigning a specific monitor serial # to you.  Billing and Patient Assistance Program Information  We have supplied [employer] with any of your insurance information on file for billing purposes. [employer] offers a sliding scale Patient Assistance Program for patients that do not have  insurance, or whose insurance does not completely cover the cost of the ZIO monitor.  You must apply for the Patient Assistance Program to qualify for this discounted rate.  To apply, please call [employer] at [phone], select option 4, select option 2, ask to apply for  Patient Assistance Program. [employer] will ask your household income, and how many people  are in your household. They will quote your out-of-pocket cost based on that information.  [employer] will also be able to set up a 12-month, interest-free payment plan if needed.  Applying the monitor   Shave hair from upper left chest.  Hold abrader disc by orange tab. Rub abrader in 40 strokes over the upper left chest as  indicated in your monitor   instructions.  Clean area with 4 enclosed alcohol pads. Let dry.  Apply patch as indicated in monitor instructions. Patch will be placed under collarbone on left  side of chest with arrow pointing upward.  Rub patch adhesive wings for 2 minutes. Remove white label marked "1". Remove the white  label marked "2". Rub patch  adhesive wings for 2 additional minutes.  While looking in a mirror, press and release button in center of patch. A small green light will  flash 3-4 times. This will be your only indicator that the monitor has been turned on.  Do not shower for the first 24 hours. You may shower after the first 24 hours.  Press the button if you feel a symptom. You will hear a small click. Record Date, Time and  Symptom in the Patient Logbook.  When you are ready to remove the patch, follow instructions on the last 2 pages of Patient  Logbook. Stick patch monitor onto the last page of Patient Logbook.  Place Patient Logbook in the blue and white box. Use locking tab on box and tape box closed  securely. The blue and white box has prepaid postage on it. Please place it in the mailbox as  soon as possible. Your physician should have your test results approximately 7 days after the  monitor has been mailed back to [employer].  Call [employer] Customer Care at [phone] if you have questions regarding  your ZIO XT patch monitor. Call them immediately if you see an orange light blinking on your  monitor.  If your monitor falls off in less than 4 days, contact our Monitor department at [phone].  If your monitor becomes loose or falls off after 4 days call [employer] at [phone] for  suggestions on securing your monitor  

## 2022-06-28 NOTE — Progress Notes (Unsigned)
Enrolled for [employer] to mail a ZIO XT long term holter monitor to the patients address on file.  

## 2022-06-29 DIAGNOSIS — I10 Essential (primary) hypertension: Secondary | ICD-10-CM | POA: Diagnosis not present

## 2022-06-29 DIAGNOSIS — E441 Mild protein-calorie malnutrition: Secondary | ICD-10-CM | POA: Diagnosis not present

## 2022-06-29 DIAGNOSIS — E559 Vitamin D deficiency, unspecified: Secondary | ICD-10-CM | POA: Diagnosis not present

## 2022-06-29 DIAGNOSIS — I4819 Other persistent atrial fibrillation: Secondary | ICD-10-CM | POA: Diagnosis not present

## 2022-06-29 DIAGNOSIS — K5090 Crohn's disease, unspecified, without complications: Secondary | ICD-10-CM | POA: Diagnosis not present

## 2022-06-29 DIAGNOSIS — E213 Hyperparathyroidism, unspecified: Secondary | ICD-10-CM | POA: Diagnosis not present

## 2022-06-30 ENCOUNTER — Other Ambulatory Visit: Payer: Self-pay | Admitting: Internal Medicine

## 2022-06-30 ENCOUNTER — Ambulatory Visit
Admission: RE | Admit: 2022-06-30 | Discharge: 2022-06-30 | Disposition: A | Discharge status: Home / Self Care | Payer: Medicare Other | Source: Ambulatory Visit | Attending: Internal Medicine | Admitting: Internal Medicine

## 2022-06-30 DIAGNOSIS — J9811 Atelectasis: Secondary | ICD-10-CM | POA: Diagnosis not present

## 2022-06-30 DIAGNOSIS — E871 Hypo-osmolality and hyponatremia: Secondary | ICD-10-CM | POA: Diagnosis not present

## 2022-06-30 DIAGNOSIS — D649 Anemia, unspecified: Secondary | ICD-10-CM | POA: Diagnosis not present

## 2022-06-30 DIAGNOSIS — F411 Generalized anxiety disorder: Secondary | ICD-10-CM | POA: Diagnosis not present

## 2022-06-30 DIAGNOSIS — R051 Acute cough: Secondary | ICD-10-CM | POA: Diagnosis not present

## 2022-06-30 DIAGNOSIS — I4891 Unspecified atrial fibrillation: Secondary | ICD-10-CM | POA: Diagnosis not present

## 2022-06-30 DIAGNOSIS — R110 Nausea: Secondary | ICD-10-CM | POA: Diagnosis not present

## 2022-06-30 DIAGNOSIS — J90 Pleural effusion, not elsewhere classified: Secondary | ICD-10-CM | POA: Diagnosis not present

## 2022-07-02 DIAGNOSIS — I10 Essential (primary) hypertension: Secondary | ICD-10-CM | POA: Diagnosis not present

## 2022-07-02 DIAGNOSIS — E441 Mild protein-calorie malnutrition: Secondary | ICD-10-CM | POA: Diagnosis not present

## 2022-07-02 DIAGNOSIS — E213 Hyperparathyroidism, unspecified: Secondary | ICD-10-CM | POA: Diagnosis not present

## 2022-07-02 DIAGNOSIS — E559 Vitamin D deficiency, unspecified: Secondary | ICD-10-CM | POA: Diagnosis not present

## 2022-07-02 DIAGNOSIS — I4819 Other persistent atrial fibrillation: Secondary | ICD-10-CM | POA: Diagnosis not present

## 2022-07-02 DIAGNOSIS — K5090 Crohn's disease, unspecified, without complications: Secondary | ICD-10-CM | POA: Diagnosis not present

## 2022-07-04 DIAGNOSIS — I480 Paroxysmal atrial fibrillation: Secondary | ICD-10-CM | POA: Diagnosis not present

## 2022-07-05 ENCOUNTER — Encounter: Payer: Self-pay | Admitting: Cardiovascular Disease

## 2022-07-06 DIAGNOSIS — E559 Vitamin D deficiency, unspecified: Secondary | ICD-10-CM | POA: Diagnosis not present

## 2022-07-06 DIAGNOSIS — I10 Essential (primary) hypertension: Secondary | ICD-10-CM | POA: Diagnosis not present

## 2022-07-06 DIAGNOSIS — E213 Hyperparathyroidism, unspecified: Secondary | ICD-10-CM | POA: Diagnosis not present

## 2022-07-06 DIAGNOSIS — K5090 Crohn's disease, unspecified, without complications: Secondary | ICD-10-CM | POA: Diagnosis not present

## 2022-07-06 DIAGNOSIS — E441 Mild protein-calorie malnutrition: Secondary | ICD-10-CM | POA: Diagnosis not present

## 2022-07-06 DIAGNOSIS — I4819 Other persistent atrial fibrillation: Secondary | ICD-10-CM | POA: Diagnosis not present

## 2022-07-08 DIAGNOSIS — E441 Mild protein-calorie malnutrition: Secondary | ICD-10-CM | POA: Diagnosis not present

## 2022-07-08 DIAGNOSIS — E559 Vitamin D deficiency, unspecified: Secondary | ICD-10-CM | POA: Diagnosis not present

## 2022-07-08 DIAGNOSIS — K5090 Crohn's disease, unspecified, without complications: Secondary | ICD-10-CM | POA: Diagnosis not present

## 2022-07-08 DIAGNOSIS — I4819 Other persistent atrial fibrillation: Secondary | ICD-10-CM | POA: Diagnosis not present

## 2022-07-08 DIAGNOSIS — I10 Essential (primary) hypertension: Secondary | ICD-10-CM | POA: Diagnosis not present

## 2022-07-08 DIAGNOSIS — E213 Hyperparathyroidism, unspecified: Secondary | ICD-10-CM | POA: Diagnosis not present

## 2022-07-11 ENCOUNTER — Other Ambulatory Visit: Payer: Self-pay

## 2022-07-11 ENCOUNTER — Inpatient Hospital Stay (HOSPITAL_COMMUNITY)
Admission: EM | Admit: 2022-07-11 | Discharge: 2022-07-17 | DRG: 640 | Disposition: A | Discharge status: Hospice | Payer: Medicare Other | Attending: Internal Medicine | Admitting: Internal Medicine

## 2022-07-11 ENCOUNTER — Emergency Department (HOSPITAL_COMMUNITY): Payer: Medicare Other

## 2022-07-11 ENCOUNTER — Encounter (HOSPITAL_COMMUNITY): Payer: Self-pay

## 2022-07-11 DIAGNOSIS — Z932 Ileostomy status: Secondary | ICD-10-CM

## 2022-07-11 DIAGNOSIS — Z85048 Personal history of other malignant neoplasm of rectum, rectosigmoid junction, and anus: Secondary | ICD-10-CM

## 2022-07-11 DIAGNOSIS — F419 Anxiety disorder, unspecified: Secondary | ICD-10-CM | POA: Diagnosis present

## 2022-07-11 DIAGNOSIS — E873 Alkalosis: Secondary | ICD-10-CM | POA: Diagnosis present

## 2022-07-11 DIAGNOSIS — I4891 Unspecified atrial fibrillation: Secondary | ICD-10-CM | POA: Diagnosis not present

## 2022-07-11 DIAGNOSIS — Z515 Encounter for palliative care: Secondary | ICD-10-CM

## 2022-07-11 DIAGNOSIS — Z79899 Other long term (current) drug therapy: Secondary | ICD-10-CM

## 2022-07-11 DIAGNOSIS — T68XXXA Hypothermia, initial encounter: Secondary | ICD-10-CM | POA: Diagnosis present

## 2022-07-11 DIAGNOSIS — I429 Cardiomyopathy, unspecified: Secondary | ICD-10-CM | POA: Diagnosis present

## 2022-07-11 DIAGNOSIS — Z711 Person with feared health complaint in whom no diagnosis is made: Secondary | ICD-10-CM | POA: Diagnosis not present

## 2022-07-11 DIAGNOSIS — R918 Other nonspecific abnormal finding of lung field: Secondary | ICD-10-CM | POA: Diagnosis not present

## 2022-07-11 DIAGNOSIS — Z90710 Acquired absence of both cervix and uterus: Secondary | ICD-10-CM

## 2022-07-11 DIAGNOSIS — J90 Pleural effusion, not elsewhere classified: Secondary | ICD-10-CM | POA: Diagnosis not present

## 2022-07-11 DIAGNOSIS — R54 Age-related physical debility: Secondary | ICD-10-CM | POA: Diagnosis present

## 2022-07-11 DIAGNOSIS — E162 Hypoglycemia, unspecified: Secondary | ICD-10-CM | POA: Diagnosis present

## 2022-07-11 DIAGNOSIS — E876 Hypokalemia: Secondary | ICD-10-CM | POA: Diagnosis not present

## 2022-07-11 DIAGNOSIS — J309 Allergic rhinitis, unspecified: Secondary | ICD-10-CM | POA: Diagnosis not present

## 2022-07-11 DIAGNOSIS — R627 Adult failure to thrive: Secondary | ICD-10-CM | POA: Diagnosis present

## 2022-07-11 DIAGNOSIS — R001 Bradycardia, unspecified: Secondary | ICD-10-CM | POA: Diagnosis not present

## 2022-07-11 DIAGNOSIS — Z66 Do not resuscitate: Secondary | ICD-10-CM | POA: Diagnosis not present

## 2022-07-11 DIAGNOSIS — K219 Gastro-esophageal reflux disease without esophagitis: Secondary | ICD-10-CM | POA: Diagnosis not present

## 2022-07-11 DIAGNOSIS — I480 Paroxysmal atrial fibrillation: Secondary | ICD-10-CM | POA: Diagnosis not present

## 2022-07-11 DIAGNOSIS — Z789 Other specified health status: Secondary | ICD-10-CM | POA: Diagnosis not present

## 2022-07-11 DIAGNOSIS — I5041 Acute combined systolic (congestive) and diastolic (congestive) heart failure: Secondary | ICD-10-CM | POA: Diagnosis not present

## 2022-07-11 DIAGNOSIS — E871 Hypo-osmolality and hyponatremia: Principal | ICD-10-CM | POA: Diagnosis present

## 2022-07-11 DIAGNOSIS — I110 Hypertensive heart disease with heart failure: Secondary | ICD-10-CM | POA: Diagnosis present

## 2022-07-11 DIAGNOSIS — J9811 Atelectasis: Secondary | ICD-10-CM | POA: Diagnosis present

## 2022-07-11 DIAGNOSIS — R680 Hypothermia, not associated with low environmental temperature: Secondary | ICD-10-CM | POA: Diagnosis present

## 2022-07-11 DIAGNOSIS — E213 Hyperparathyroidism, unspecified: Secondary | ICD-10-CM | POA: Diagnosis not present

## 2022-07-11 DIAGNOSIS — I4510 Unspecified right bundle-branch block: Secondary | ICD-10-CM | POA: Diagnosis present

## 2022-07-11 DIAGNOSIS — I272 Pulmonary hypertension, unspecified: Secondary | ICD-10-CM | POA: Diagnosis not present

## 2022-07-11 DIAGNOSIS — Z7401 Bed confinement status: Secondary | ICD-10-CM | POA: Diagnosis not present

## 2022-07-11 DIAGNOSIS — R410 Disorientation, unspecified: Secondary | ICD-10-CM | POA: Diagnosis not present

## 2022-07-11 DIAGNOSIS — I10 Essential (primary) hypertension: Secondary | ICD-10-CM | POA: Diagnosis present

## 2022-07-11 DIAGNOSIS — Z9049 Acquired absence of other specified parts of digestive tract: Secondary | ICD-10-CM

## 2022-07-11 DIAGNOSIS — Z8719 Personal history of other diseases of the digestive system: Secondary | ICD-10-CM | POA: Diagnosis not present

## 2022-07-11 DIAGNOSIS — M7989 Other specified soft tissue disorders: Secondary | ICD-10-CM | POA: Diagnosis present

## 2022-07-11 DIAGNOSIS — R2681 Unsteadiness on feet: Secondary | ICD-10-CM | POA: Diagnosis present

## 2022-07-11 DIAGNOSIS — M810 Age-related osteoporosis without current pathological fracture: Secondary | ICD-10-CM | POA: Diagnosis present

## 2022-07-11 DIAGNOSIS — Z7901 Long term (current) use of anticoagulants: Secondary | ICD-10-CM

## 2022-07-11 DIAGNOSIS — I5031 Acute diastolic (congestive) heart failure: Secondary | ICD-10-CM | POA: Diagnosis not present

## 2022-07-11 DIAGNOSIS — E785 Hyperlipidemia, unspecified: Secondary | ICD-10-CM | POA: Diagnosis not present

## 2022-07-11 DIAGNOSIS — I4819 Other persistent atrial fibrillation: Secondary | ICD-10-CM | POA: Diagnosis present

## 2022-07-11 DIAGNOSIS — Z8679 Personal history of other diseases of the circulatory system: Secondary | ICD-10-CM | POA: Diagnosis not present

## 2022-07-11 DIAGNOSIS — Z888 Allergy status to other drugs, medicaments and biological substances status: Secondary | ICD-10-CM

## 2022-07-11 DIAGNOSIS — R4182 Altered mental status, unspecified: Secondary | ICD-10-CM | POA: Diagnosis not present

## 2022-07-11 DIAGNOSIS — I441 Atrioventricular block, second degree: Secondary | ICD-10-CM | POA: Diagnosis present

## 2022-07-11 DIAGNOSIS — D649 Anemia, unspecified: Secondary | ICD-10-CM | POA: Diagnosis not present

## 2022-07-11 DIAGNOSIS — F411 Generalized anxiety disorder: Secondary | ICD-10-CM | POA: Diagnosis present

## 2022-07-11 DIAGNOSIS — R29898 Other symptoms and signs involving the musculoskeletal system: Secondary | ICD-10-CM | POA: Diagnosis not present

## 2022-07-11 DIAGNOSIS — I3139 Other pericardial effusion (noninflammatory): Secondary | ICD-10-CM | POA: Diagnosis not present

## 2022-07-11 DIAGNOSIS — I5043 Acute on chronic combined systolic (congestive) and diastolic (congestive) heart failure: Secondary | ICD-10-CM | POA: Diagnosis present

## 2022-07-11 DIAGNOSIS — Z7189 Other specified counseling: Secondary | ICD-10-CM | POA: Diagnosis not present

## 2022-07-11 DIAGNOSIS — C20 Malignant neoplasm of rectum: Secondary | ICD-10-CM | POA: Diagnosis present

## 2022-07-11 DIAGNOSIS — I5020 Unspecified systolic (congestive) heart failure: Secondary | ICD-10-CM | POA: Diagnosis not present

## 2022-07-11 DIAGNOSIS — G9341 Metabolic encephalopathy: Secondary | ICD-10-CM | POA: Diagnosis present

## 2022-07-11 DIAGNOSIS — R638 Other symptoms and signs concerning food and fluid intake: Secondary | ICD-10-CM | POA: Diagnosis not present

## 2022-07-11 DIAGNOSIS — R0602 Shortness of breath: Secondary | ICD-10-CM | POA: Diagnosis not present

## 2022-07-11 LAB — CBC
HCT: 36 % (ref 36.0–46.0)
Hemoglobin: 11.3 g/dL — ABNORMAL LOW (ref 12.0–15.0)
MCH: 27.4 pg (ref 26.0–34.0)
MCHC: 31.4 g/dL (ref 30.0–36.0)
MCV: 87.2 fL (ref 80.0–100.0)
Platelets: 174 10*3/uL (ref 150–400)
RBC: 4.13 MIL/uL (ref 3.87–5.11)
RDW: 15.9 % — ABNORMAL HIGH (ref 11.5–15.5)
WBC: 5.1 10*3/uL (ref 4.0–10.5)
nRBC: 0 % (ref 0.0–0.2)

## 2022-07-11 LAB — COMPREHENSIVE METABOLIC PANEL
ALT: 51 U/L — ABNORMAL HIGH (ref 0–44)
AST: 62 U/L — ABNORMAL HIGH (ref 15–41)
Albumin: 3.5 g/dL (ref 3.5–5.0)
Alkaline Phosphatase: 76 U/L (ref 38–126)
Anion gap: 12 (ref 5–15)
BUN: 14 mg/dL (ref 8–23)
CO2: 31 mmol/L (ref 22–32)
Calcium: 9 mg/dL (ref 8.9–10.3)
Chloride: 80 mmol/L — ABNORMAL LOW (ref 98–111)
Creatinine, Ser: 0.7 mg/dL (ref 0.44–1.00)
GFR, Estimated: >60 mL/min (ref >60)
Glucose, Bld: 62 mg/dL — ABNORMAL LOW (ref 70–99)
Potassium: 4 mmol/L (ref 3.5–5.1)
Sodium: 123 mmol/L — ABNORMAL LOW (ref 135–145)
Total Bilirubin: 1.4 mg/dL — ABNORMAL HIGH (ref 0.3–1.2)
Total Protein: 6.7 g/dL (ref 6.5–8.1)

## 2022-07-11 LAB — TSH: TSH: 1.193 u[IU]/mL (ref 0.350–4.500)

## 2022-07-11 LAB — CBG MONITORING, ED: Glucose-Capillary: 61 mg/dL — ABNORMAL LOW (ref 70–99)

## 2022-07-11 LAB — LACTIC ACID, PLASMA
Lactic Acid, Venous: 0.9 mmol/L (ref 0.5–1.9)
Lactic Acid, Venous: 0.9 mmol/L (ref 0.5–1.9)

## 2022-07-11 LAB — BRAIN NATRIURETIC PEPTIDE: B Natriuretic Peptide: 664.9 pg/mL — ABNORMAL HIGH (ref 0.0–100.0)

## 2022-07-11 MED ORDER — PANTOPRAZOLE SODIUM 40 MG PO TBEC
40.0000 mg | DELAYED_RELEASE_TABLET | Freq: Every day | ORAL | Status: DC
  Administered 2022-07-12 – 2022-07-17 (×6): 40 mg via ORAL
  Filled 2022-07-11 (×6): qty 1

## 2022-07-11 MED ORDER — ONDANSETRON HCL 4 MG/2ML IJ SOLN
4.0000 mg | Freq: Once | INTRAMUSCULAR | Status: AC
  Administered 2022-07-11: 4 mg via INTRAVENOUS
  Filled 2022-07-11: qty 2

## 2022-07-11 MED ORDER — SODIUM CHLORIDE 0.9% FLUSH
3.0000 mL | Freq: Two times a day (BID) | INTRAVENOUS | Status: DC
  Administered 2022-07-11 – 2022-07-17 (×12): 3 mL via INTRAVENOUS

## 2022-07-11 MED ORDER — ACETAMINOPHEN 325 MG PO TABS
650.0000 mg | ORAL_TABLET | ORAL | Status: DC | PRN
  Administered 2022-07-15: 650 mg via ORAL
  Filled 2022-07-11 (×2): qty 2

## 2022-07-11 MED ORDER — NYSTATIN 100000 UNIT/GM EX POWD
Freq: Three times a day (TID) | CUTANEOUS | Status: DC
  Filled 2022-07-11 (×2): qty 15

## 2022-07-11 MED ORDER — ONDANSETRON HCL 4 MG/2ML IJ SOLN: 4.0000 mg | Freq: Four times a day (QID) | INTRAMUSCULAR | Status: DC | PRN

## 2022-07-11 MED ORDER — FUROSEMIDE 10 MG/ML IJ SOLN
40.0000 mg | Freq: Two times a day (BID) | INTRAMUSCULAR | Status: DC
  Administered 2022-07-12 – 2022-07-13 (×4): 40 mg via INTRAVENOUS
  Filled 2022-07-11 (×5): qty 4

## 2022-07-11 MED ORDER — SODIUM CHLORIDE 0.9% FLUSH: 3.0000 mL | INTRAVENOUS | Status: DC | PRN

## 2022-07-11 MED ORDER — LISINOPRIL 10 MG PO TABS
10.0000 mg | ORAL_TABLET | Freq: Every day | ORAL | Status: DC
  Administered 2022-07-12 – 2022-07-14 (×3): 10 mg via ORAL
  Filled 2022-07-11 (×3): qty 1

## 2022-07-11 MED ORDER — GLUCOSE 40 % PO GEL
1.0000 | ORAL | Status: AC
  Filled 2022-07-11: qty 1.21

## 2022-07-11 MED ORDER — APIXABAN 5 MG PO TABS
5.0000 mg | ORAL_TABLET | Freq: Two times a day (BID) | ORAL | Status: DC
  Administered 2022-07-12 – 2022-07-13 (×3): 5 mg via ORAL
  Filled 2022-07-11 (×3): qty 1

## 2022-07-11 MED ORDER — SODIUM CHLORIDE 0.9 % IV SOLN: 250.0000 mL | INTRAVENOUS | Status: DC | PRN

## 2022-07-11 MED ORDER — ALPRAZOLAM 0.25 MG PO TABS: 0.2500 mg | ORAL_TABLET | Freq: Two times a day (BID) | ORAL | Status: DC | PRN

## 2022-07-11 MED ORDER — DEXTROSE 50 % IV SOLN
1.0000 | Freq: Once | INTRAVENOUS | Status: AC
  Administered 2022-07-11: 50 mL via INTRAVENOUS
  Filled 2022-07-11: qty 50

## 2022-07-11 NOTE — H&P (Signed)
History and Physical    Patient: Sandy Trujillo WJX:[id number] DOB: 1933/01/15 DOA: 07/11/2022 DOS: the patient was seen and examined on 07/11/2022 PCP: Thana Ates, MD  Patient coming from: Home  Chief Complaint:  Chief Complaint  Patient presents with   Altered Mental Status   Bil Leg Swelling   HPI: Sandy Trujillo is a 89 y.o. female with medical history significant of GERD, hyperparathyroidism, essential hypertension, history of ileostomy, osteoporosis, history of rectal cancer, history of anemia and atrial fibrillation on treatment who was brought in by family secondary to altered mental status.  Patient was apparently confused today.  She lives independently and goes to the [place].  She takes care of herself.  Was recently on a cruise and planning to go to another 1.  In the last few days she has had intermittent altered mental status.  Now family is assisting her.  She has history of cardiac disease mainly A-fib with cardioversion done in April.  She has remained aneurysm after that.  She is not having progressive shortness of breath and overall swelling.  They have noticed progressive leg swelling and now patient is complaining of the shortness of breath.  No weakness.  She was confused.  On arrival blood pressure was 201/90.  Patient has a picture of anasarca.  Appears to have new onset CHF.  She is being admitted for further evaluation and treatment.  Review of Systems: As mentioned in the history of present illness. All other systems reviewed and are negative. Past Medical History:  Diagnosis Date   Anemia    Anxiety    Bell's palsy    GERD (gastroesophageal reflux disease)    Hyperparathyroidism (HCC) 2019   Hypertension    Ileostomy in place [facility])    Osteoporosis 2019   Pneumonia    hx of   rectal ca 2007   surg only   Past Surgical History:  Procedure Laterality Date   ABDOMINAL HYSTERECTOMY     BREAST EXCISIONAL BIOPSY Bilateral    benign    CARDIOVERSION N/A 06/07/2022   Procedure: CARDIOVERSION;  Surgeon: Sande Rives, MD;  Location: [facility] INVASIVE CV LAB;  Service: Cardiovascular;  Laterality: N/A;   COLON SURGERY  2007   ESOPHAGOGASTRODUODENOSCOPY (EGD) WITH PROPOFOL N/A 06/11/2022   Procedure: ESOPHAGOGASTRODUODENOSCOPY (EGD) WITH PROPOFOL;  Surgeon: Sherrilyn Rist, MD;  Location: [facility] ENDOSCOPY;  Service: Gastroenterology;  Laterality: N/A;   HEMOSTASIS CLIP PLACEMENT  06/11/2022   Procedure: HEMOSTASIS CLIP PLACEMENT;  Surgeon: Sherrilyn Rist, MD;  Location: [facility] ENDOSCOPY;  Service: Gastroenterology;;   HOT HEMOSTASIS N/A 06/11/2022   Procedure: HOT HEMOSTASIS (ARGON PLASMA COAGULATION/BICAP);  Surgeon: Sherrilyn Rist, MD;  Location: [facility] ENDOSCOPY;  Service: Gastroenterology;  Laterality: N/A;   PARATHYROIDECTOMY N/A 05/26/2021   Procedure: NECK EXPLORATION WITH RIGHT INFERIOR PARATHYROIDECTOMY;  Surgeon: Darnell Level, MD;  Location: [facility] ORS;  Service: General;  Laterality: N/A;   surgery for spin al stenosis      bone spur touching nerve per pt   Social History:  reports that she has never smoked. She has never used smokeless tobacco. She reports that she does not currently use alcohol. She reports that she does not use drugs.  Allergies  Allergen Reactions   Actonel [Risedronate] Other (See Comments)    Lowered BP   Buspar [Buspirone] Other (See Comments)    Increased risk for Afib   Sertraline Diarrhea    History reviewed. No pertinent family history.  Prior to Admission medications   Medication Sig Start Date End Date Taking? Authorizing Provider  acetaminophen (TYLENOL) 500 MG tablet Take 500-1,000 mg by mouth every 6 (six) hours as needed (pain.).   Yes [provider]  ALPRAZolam (XANAX) 0.25 MG tablet Take 0.25 mg by mouth 2 (two) times daily as needed. 06/22/22  Yes [provider]  apixaban (ELIQUIS) 5 MG TABS tablet Take 1 tablet (5 mg total) by mouth 2 (two) times daily. Resume  from 06/14/2022 as per cardiology/GI recommendations 06/14/22 07/14/22 Yes Glade Lloyd, MD  ferrous sulfate 325 (65 FE) MG tablet Take 325 mg by mouth daily at 12 noon.   Yes [provider]  lisinopril (ZESTRIL) 10 MG tablet Take 1 tablet (10 mg total) by mouth daily. 06/13/22  Yes Glade Lloyd, MD  loratadine (CLARITIN) 5 MG chewable tablet Chew 5 mg by mouth daily.   Yes [provider]  Multiple Vitamins-Minerals (CENTRUM SILVER PO) Take 1 tablet by mouth daily.   Yes [provider]  ondansetron (ZOFRAN-ODT) 4 MG disintegrating tablet Take 4 mg by mouth 2 (two) times daily. 06/30/22  Yes [provider]  pantoprazole (PROTONIX) 40 MG tablet Take 1 tablet (40 mg total) by mouth daily. 06/13/22  Yes Glade Lloyd, MD    Physical Exam: Vitals:   07/11/22 1745 07/11/22 1800 07/11/22 1822 07/11/22 2000  BP:   (!) 187/86 (!) 190/77  Pulse: (!) 54 (!) 56 (!) 54 (!) 55  Resp: (!) 21 20 19 18   Temp:      TempSrc:      SpO2: 100% 100% 99% 99%  Weight:      Height:       Constitutional: Chronically ill looking no distress, anasarca picture NAD, calm, comfortable Eyes: PERRL, lids and conjunctivae normal ENMT: Mucous membranes are moist. Posterior pharynx clear of any exudate or lesions.Normal dentition.  Neck: normal, supple, no masses, no thyromegaly Respiratory: clear to auscultation bilaterally, no wheezing, no crackles. Normal respiratory effort. No accessory muscle use.  Cardiovascular: Irregularly irregular with bradycardia, no murmurs / rubs / gallops.  2+ pedal edema with anasarca. 2+ pedal pulses. No carotid bruits.  Abdomen: no tenderness, no masses palpated. No hepatosplenomegaly. Bowel sounds positive.  Musculoskeletal: Good range of motion, no joint swelling or tenderness, Skin: no rashes, lesions, ulcers. No induration Neurologic: CN 2-12 grossly intact. Sensation intact, DTR normal. Strength 5/5 in all 4.  Psychiatric: Confused but  communicating.  Follows simple commands  Data Reviewed:  Temperature is 95 blood pressure 201/90 pulse 52 respiratory 22 oxygen sats 91% on room air.  Sodium 123 potassium 4.0 chloride 80.  Glucose is 62.  AST 62 ALT 51 BNP 664.  Hemoglobin 11.3.  Chest x-ray showed mild pulmonary edema.  Head CT without contrast showed no acute findings.  Assessment and Plan:  #1 acute metabolic encephalopathy: Appears to be multifactorial.  Patient was hypoglycemic and hypothermic which may explain it.  Not a diabetic.  Patient will be admitted.  Treated for CHF exacerbation.  Glucose infusion as needed for hypoglycemia.  Monitor blood sugar closely.  Monitor temperature closely.  She is already coming around.  #2 hypoglycemia: Not a known diabetic.  Not sure of the cause.  Patient is slightly hypothermic on arrival.  At this point continue with hypoglycemic protocol.  #3 new onset CHF: No prior diagnosis of CHF.  Patient does have A-fib.  At this point continue with echocardiogram and diuresis.  Cardiology consult once echo is done.  #  4 atrial fibrillation: Rate is controlled.  She is on Eliquis continue.  #5 hyperlipidemia: Continue home statin  #6 history of carcinoma of the rectum: Follows up with Dr. Clelia Croft.  Has ileostomy in place.  Continue care in the hospital.  #7 hyponatremia: Probably due to fluid overload.  Diuresis with Lasix.  Monitor.       Advance Care Planning:   Code Status: Full Code   Consults: None  Family Communication: 2 daughters at bedside  Severity of Illness: The appropriate patient status for this patient is INPATIENT. Inpatient status is judged to be reasonable and necessary in order to provide the required intensity of service to ensure the patient's safety. The patient's presenting symptoms, physical exam findings, and initial radiographic and laboratory data in the context of their chronic comorbidities is felt to place them at high risk for further clinical  deterioration. Furthermore, it is not anticipated that the patient will be medically stable for discharge from the hospital within 2 midnights of admission.   * I certify that at the point of admission it is my clinical judgment that the patient will require inpatient hospital care spanning beyond 2 midnights from the point of admission due to high intensity of service, high risk for further deterioration and high frequency of surveillance required.*  AuthorLonia Blood, MD 07/11/2022 8:31 PM  For on call review [email or web address].

## 2022-07-11 NOTE — ED Notes (Signed)
Awaiting patient from lobby 

## 2022-07-11 NOTE — ED Triage Notes (Addendum)
Pt came in via POV d/t AMS, untreated A-Fib & bil leg swelling. Family reports that pt is seen at the [facility], lives independently at baseline & usually takes care of herself alone & does fine but now her intermittent AMS has her needing family with her & to assist her with her ileostomy bag care. Family relays that she had a cardioversion done sometime after April 10th & after her check-up she was back into that rhythm. Pt also reports difficulty taking a deep breath early today as well. A/Ox4 while in triage, no slurred speech, grips equal in strength, no unilateral weakness in extremities as well. Hypertensive at 201/90 while in triage.

## 2022-07-11 NOTE — Progress Notes (Signed)
Patient blood sugar is 140.

## 2022-07-11 NOTE — ED Notes (Signed)
ED TO INPATIENT HANDOFF REPORT  ED Nurse Name and Phone #: Nelly Laurence Name/Age/Gender Sandy Trujillo 89 y.o. female Room/Bed: 025C/025C  Code Status   Code Status: Prior  Home/SNF/Other Home Patient oriented to: self, place, and situation Is this baseline? Yes   Triage Complete: Triage complete  Chief Complaint Acute metabolic encephalopathy [G93.41]  Triage Note Pt came in via POV d/t AMS, untreated A-Fib & bil leg swelling. Family reports that pt is seen at the [facility], lives independently at baseline & usually takes care of herself alone & does fine but now her intermittent AMS has her needing family with her & to assist her with her ileostomy bag care. Family relays that she had a cardioversion done sometime after April 10th & after her check-up she was back into that rhythm. Pt also reports difficulty taking a deep breath early today as well. A/Ox4 while in triage, no slurred speech, grips equal in strength, no unilateral weakness in extremities as well. Hypertensive at 201/90 while in triage.   Allergies Allergies  Allergen Reactions   Actonel [Risedronate] Other (See Comments)    Lowered BP   Buspar [Buspirone] Other (See Comments)    Increased risk for Afib   Sertraline Diarrhea    Level of Care/Admitting Diagnosis ED Disposition     ED Disposition  Admit   Condition  --   Comment  Hospital Area: [facility] [[id number]]  Level of Care: Telemetry Cardiac [[id number]]  May admit patient to [facility] or [facility] if equivalent level of care is available:: No  Covid Evaluation: Asymptomatic - no recent exposure (last 10 days) testing not required  Diagnosis: Acute metabolic encephalopathy [0865784]  Admitting Physician: Rometta Emery [[id number]]  Attending Physician: Rometta Emery [[id number]]  Certification:: I certify this patient will need inpatient services for at least 2 midnights  Estimated Length of Stay: 3           B Medical/Surgery History Past Medical History:  Diagnosis Date   Anemia    Anxiety    Bell's palsy    GERD (gastroesophageal reflux disease)    Hyperparathyroidism (HCC) 2019   Hypertension    Ileostomy in place [facility])    Osteoporosis 2019   Pneumonia    hx of   rectal ca 2007   surg only   Past Surgical History:  Procedure Laterality Date   ABDOMINAL HYSTERECTOMY     BREAST EXCISIONAL BIOPSY Bilateral    benign   CARDIOVERSION N/A 06/07/2022   Procedure: CARDIOVERSION;  Surgeon: Sande Rives, MD;  Location: [facility] INVASIVE CV LAB;  Service: Cardiovascular;  Laterality: N/A;   COLON SURGERY  2007   ESOPHAGOGASTRODUODENOSCOPY (EGD) WITH PROPOFOL N/A 06/11/2022   Procedure: ESOPHAGOGASTRODUODENOSCOPY (EGD) WITH PROPOFOL;  Surgeon: Sherrilyn Rist, MD;  Location[facility] ENDOSCOPY;  Service: Gastroenterology;  Laterality: N/A;   HEMOSTASIS CLIP PLACEMENT  06/11/2022   Procedure: HEMOSTASIS CLIP PLACEMENT;  Surgeon: Sherrilyn Rist, MD;  Location: [facility] ENDOSCOPY;  Service: Gastroenterology;;   HOT HEMOSTASIS N/A 06/11/2022   Procedure: HOT HEMOSTASIS (ARGON PLASMA COAGULATION/BICAP);  Surgeon: Sherrilyn Rist, MD;  Location: [facility] ENDOSCOPY;  Service: Gastroenterology;  Laterality: N/A;   PARATHYROIDECTOMY N/A 05/26/2021   Procedure: NECK EXPLORATION WITH RIGHT INFERIOR PARATHYROIDECTOMY;  Surgeon: Darnell Level, MD;  Location: [facility] ORS;  Service: General;  Laterality: N/A;   surgery for spin al stenosis      bone spur touching nerve per pt  A IV Location/Drains/Wounds Patient Lines/Drains/Airways Status     Active Line/Drains/Airways     Name Placement date Placement time Site Days   Peripheral IV 07/11/22 20 G Right Antecubital 07/11/22  1650  Antecubital  less than 1   Colostomy RLQ 06/10/22  1340  RLQ  31            Intake/Output Last 24 hours No intake or output data in the 24 hours ending 07/11/22 2020  Labs/Imaging Results for orders placed or performed  during the hospital encounter of 07/11/22 (from the past 48 hour(s))  CBG monitoring, ED     Status: Abnormal   Collection Time: 07/11/22  2:57 PM  Result Value Ref Range   Glucose-Capillary 61 (L) 70 - 99 mg/dL    Comment: Glucose reference range applies only to samples taken after fasting for at least 8 hours.   Comment 1 Notify RN    Comment 2 Document in Chart   Brain natriuretic peptide     Status: Abnormal   Collection Time: 07/11/22  3:01 PM  Result Value Ref Range   B Natriuretic Peptide 664.9 (H) 0.0 - 100.0 pg/mL    Comment: Performed at [facility], [address], [place], [place] [address]  TSH     Status: None   Collection Time: 07/11/22  3:01 PM  Result Value Ref Range   TSH 1.193 0.350 - 4.500 uIU/mL    Comment: Performed by a 3rd Generation assay with a functional sensitivity of <=0.01 uIU/mL. Performed at [facility], [address], [place], [place] [address]   Comprehensive metabolic panel     Status: Abnormal   Collection Time: 07/11/22  3:03 PM  Result Value Ref Range   Sodium 123 (L) 135 - 145 mmol/L   Potassium 4.0 3.5 - 5.1 mmol/L   Chloride 80 (L) 98 - 111 mmol/L   CO2 31 22 - 32 mmol/L   Glucose, Bld 62 (L) 70 - 99 mg/dL    Comment: Glucose reference range applies only to samples taken after fasting for at least 8 hours.   BUN 14 8 - 23 mg/dL   Creatinine, Ser 6.43 0.44 - 1.00 mg/dL   Calcium 9.0 8.9 - 32.9 mg/dL   Total Protein 6.7 6.5 - 8.1 g/dL   Albumin 3.5 3.5 - 5.0 g/dL   AST 62 (H) 15 - 41 U/L   ALT 51 (H) 0 - 44 U/L   Alkaline Phosphatase 76 38 - 126 U/L   Total Bilirubin 1.4 (H) 0.3 - 1.2 mg/dL   GFR, Estimated >51 >88 mL/min    Comment: (NOTE) Calculated using the CKD-EPI Creatinine Equation (2021)    Anion gap 12 5 - 15    Comment: Performed at [facility], [address] [place], [place] [address]  CBC     Status: Abnormal   Collection Time: 07/11/22  3:03 PM  Result Value Ref Range   WBC 5.1 4.0 - 10.5 K/uL    RBC 4.13 3.87 - 5.11 MIL/uL   Hemoglobin 11.3 (L) 12.0 - 15.0 g/dL   HCT 63.0 16.0 - 10.9 %   MCV 87.2 80.0 - 100.0 fL   MCH 27.4 26.0 - 34.0 pg   MCHC 31.4 30.0 - 36.0 g/dL   RDW 32.3 (H) 55.7 - 32.2 %   Platelets 174 150 - 400 K/uL   nRBC 0.0 0.0 - 0.2 %    Comment: Performed at [facility],  [address] [place], [place] [address]  Lactic acid, plasma     Status: None   Collection Time: 07/11/22  4:33 PM  Result Value Ref Range   Lactic Acid, Venous 0.9 0.5 - 1.9 mmol/L    Comment: Performed at [facility], [address] [place], [place] [address]   CT Head Wo Contrast  Result Date: 07/11/2022 CLINICAL DATA:  Altered mental status. EXAM: CT HEAD WITHOUT CONTRAST TECHNIQUE: Contiguous axial images were obtained from the base of the skull through the vertex without intravenous contrast. RADIATION DOSE REDUCTION: This exam was performed according to the departmental dose-optimization program which includes automated exposure control, adjustment of the mA and/or kV according to patient size and/or use of iterative reconstruction technique. COMPARISON:  None Available. FINDINGS: Brain: Mild age-related atrophy and chronic microvascular ischemic changes. There is no acute intracranial hemorrhage. No mass effect or midline shift. No extra-axial fluid collection. Vascular: No hyperdense vessel or unexpected calcification. Skull: Normal. Negative for fracture or focal lesion. Sinuses/Orbits: No acute finding. Other: None IMPRESSION: 1. No acute intracranial pathology. 2. Mild age-related atrophy and chronic microvascular ischemic changes. Electronically Signed   By: Elgie Collard M.D.   On: 07/11/2022 19:01   DG Chest 2 View  Result Date: 07/11/2022 CLINICAL DATA:  SOB EXAM: CHEST - 2 VIEW COMPARISON:  June 30, 2022 FINDINGS: The cardiomediastinal silhouette is unchanged in contour. Small bilateral pleural effusions, mildly increased. No pneumothorax. Mildly increased bibasilar  homogeneous platelike opacities. Mildly increased interstitial markings diffusely. Visualized abdomen is unremarkable. Mild multilevel degenerative changes of the thoracic spine. IMPRESSION: Constellation of findings are favored to reflect mild pulmonary edema with increased small bilateral pleural effusions and bibasilar atelectasis. Electronically Signed   By: Meda Klinefelter M.D.   On: 07/11/2022 15:47    Pending Labs Unresulted Labs (From admission, onward)     Start     Ordered   07/11/22 1633  Lactic acid, plasma  Now then every 2 hours,   R (with STAT occurrences)      07/11/22 1632   07/11/22 1633  Urinalysis, Routine w reflex microscopic -Urine, Catheterized  Once,   URGENT       Question:  Specimen Source  Answer:  Urine, Catheterized   07/11/22 1634            Vitals/Pain Today's Vitals   07/11/22 1745 07/11/22 1800 07/11/22 1822 07/11/22 2000  BP:   (!) 187/86 (!) 190/77  Pulse: (!) 54 (!) 56 (!) 54 (!) 55  Resp: (!) 21 20 19 18   Temp:      TempSrc:      SpO2: 100% 100% 99% 99%  Weight:      Height:      PainSc:        Isolation Precautions No active isolations  Medications Medications  dextrose 50 % solution 50 mL (50 mLs Intravenous Given 07/11/22 1651)  ondansetron (ZOFRAN) injection 4 mg (4 mg Intravenous Given 07/11/22 1741)    Mobility walks with person assist     Focused Assessments See chart   R Recommendations: See Admitting Provider Note  Report given to:   Additional Notes: see chart

## 2022-07-11 NOTE — ED Provider Notes (Signed)
[place] EMERGENCY DEPARTMENT AT [facility] Provider Note   CSN: [id number] Arrival date & time: 07/11/22  1347     History  Chief Complaint  Patient presents with   Altered Mental Status   Bil Leg Swelling    Sandy Trujillo is a 89 y.o. female history of rectal cancer status post ileostomy, persistent A-fib on Eliquis, AVM of small bowel presented with 1 day of AMS.  Patient was seen yesterday at [facility] and was noted to have bilateral leg swelling along with A-fib but was ultimately discharged.  Today the caregiver noticed that patient was not walking right and appears more confused than normal.  Patient blood pressures also been elevated according to family to the low 200s.  Family states that patient has been taking her medications without issue.  Family states that patient did have cardioversion done back in 06/07/2022 due to Mobitz type I.  Family denies any recent trauma, fevers, nausea/vomiting  ROS was unable to be performed as patient was altered  Home Medications Prior to Admission medications   Medication Sig Start Date End Date Taking? Authorizing Provider  acetaminophen (TYLENOL) 500 MG tablet Take 500-1,000 mg by mouth every 6 (six) hours as needed (pain.).   Yes [provider]  ALPRAZolam (XANAX) 0.25 MG tablet Take 0.25 mg by mouth 2 (two) times daily as needed. 06/22/22  Yes [provider]  apixaban (ELIQUIS) 5 MG TABS tablet Take 1 tablet (5 mg total) by mouth 2 (two) times daily. Resume from 06/14/2022 as per cardiology/GI recommendations 06/14/22 07/14/22 Yes Glade Lloyd, MD  ferrous sulfate 325 (65 FE) MG tablet Take 325 mg by mouth daily at 12 noon.   Yes [provider]  lisinopril (ZESTRIL) 10 MG tablet Take 1 tablet (10 mg total) by mouth daily. 06/13/22  Yes Glade Lloyd, MD  loratadine (CLARITIN) 5 MG chewable tablet Chew 5 mg by mouth daily.   Yes [provider]  Multiple Vitamins-Minerals (CENTRUM SILVER  PO) Take 1 tablet by mouth daily.   Yes [provider]  ondansetron (ZOFRAN-ODT) 4 MG disintegrating tablet Take 4 mg by mouth 2 (two) times daily. 06/30/22  Yes [provider]  pantoprazole (PROTONIX) 40 MG tablet Take 1 tablet (40 mg total) by mouth daily. 06/13/22  Yes Glade Lloyd, MD      Allergies    Actonel [risedronate], Buspar [buspirone], and Sertraline    Review of Systems   Review of Systems See HPI Physical Exam Updated Vital Signs BP (!) 187/86   Pulse (!) 54   Temp (!) 95 F (35 C) (Temporal)   Resp 19   Ht 5' (1.524 m)   Wt 60 kg   SpO2 99%   BMI 25.83 kg/m  Physical Exam Constitutional:      General: She is not in acute distress. Cardiovascular:     Rate and Rhythm: Regular rhythm. Bradycardia present.     Pulses: Normal pulses.     Heart sounds: Normal heart sounds.     Comments: JVD Pulmonary:     Effort: Pulmonary effort is normal. No respiratory distress.     Breath sounds: Normal breath sounds.  Abdominal:     Palpations: Abdomen is soft.     Tenderness: There is no abdominal tenderness. There is no guarding or rebound.     Comments: Ileostomy placed  Musculoskeletal:        General: Normal range of motion.     Cervical back: Normal range of  motion.     Comments: 2+ bilateral pitting edema  Skin:    General: Skin is warm and dry.     Capillary Refill: Capillary refill takes less than 2 seconds.     Comments: Skin candidiasis lower abdomen  Neurological:     Mental Status: She is alert.     Comments: Smile was symmetrical, no slurring of words; Unable to do the rest of the neurologic exam due to patient having able to follow directions due to confusion  Psychiatric:     Comments: Altered     ED Results / Procedures / Treatments   Labs (all labs ordered are listed, but only abnormal results are displayed) Labs Reviewed  COMPREHENSIVE METABOLIC PANEL - Abnormal; Notable for the following components:      Result Value    Sodium 123 (*)    Chloride 80 (*)    Glucose, Bld 62 (*)    AST 62 (*)    ALT 51 (*)    Total Bilirubin 1.4 (*)    All other components within normal limits  CBC - Abnormal; Notable for the following components:   Hemoglobin 11.3 (*)    RDW 15.9 (*)    All other components within normal limits  BRAIN NATRIURETIC PEPTIDE - Abnormal; Notable for the following components:   B Natriuretic Peptide 664.9 (*)    All other components within normal limits  CBG MONITORING, ED - Abnormal; Notable for the following components:   Glucose-Capillary 61 (*)    All other components within normal limits  LACTIC ACID, PLASMA  TSH  LACTIC ACID, PLASMA  URINALYSIS, ROUTINE W REFLEX MICROSCOPIC    EKG EKG Interpretation  Date/Time:  Sunday July 11 2022 14:09:58 EDT Ventricular Rate:  59 PR Interval:    QRS Duration: 92 QT Interval:  472 QTC Calculation: 467 R Axis:   105 Text Interpretation: Atrial fibrillation with slow ventricular response Rightward axis Incomplete right bundle branch block Cannot rule out Anterior infarct , age undetermined Abnormal ECG No significant change since last tracing Confirmed by Melene Plan [phone]) on 07/11/2022 4:19:01 PM  Radiology CT Head Wo Contrast  Result Date: 07/11/2022 CLINICAL DATA:  Altered mental status. EXAM: CT HEAD WITHOUT CONTRAST TECHNIQUE: Contiguous axial images were obtained from the base of the skull through the vertex without intravenous contrast. RADIATION DOSE REDUCTION: This exam was performed according to the departmental dose-optimization program which includes automated exposure control, adjustment of the mA and/or kV according to patient size and/or use of iterative reconstruction technique. COMPARISON:  None Available. FINDINGS: Brain: Mild age-related atrophy and chronic microvascular ischemic changes. There is no acute intracranial hemorrhage. No mass effect or midline shift. No extra-axial fluid collection. Vascular: No hyperdense vessel  or unexpected calcification. Skull: Normal. Negative for fracture or focal lesion. Sinuses/Orbits: No acute finding. Other: None IMPRESSION: 1. No acute intracranial pathology. 2. Mild age-related atrophy and chronic microvascular ischemic changes. Electronically Signed   By: Elgie Collard M.D.   On: 07/11/2022 19:01   DG Chest 2 View  Result Date: 07/11/2022 CLINICAL DATA:  SOB EXAM: CHEST - 2 VIEW COMPARISON:  June 30, 2022 FINDINGS: The cardiomediastinal silhouette is unchanged in contour. Small bilateral pleural effusions, mildly increased. No pneumothorax. Mildly increased bibasilar homogeneous platelike opacities. Mildly increased interstitial markings diffusely. Visualized abdomen is unremarkable. Mild multilevel degenerative changes of the thoracic spine. IMPRESSION: Constellation of findings are favored to reflect mild pulmonary edema with increased small bilateral pleural effusions and bibasilar atelectasis. Electronically Signed  By: Meda Klinefelter M.D.   On: 07/11/2022 15:47    Procedures Procedures    Medications Ordered in ED Medications  dextrose 50 % solution 50 mL (50 mLs Intravenous Given 07/11/22 1651)  ondansetron (ZOFRAN) injection 4 mg (4 mg Intravenous Given 07/11/22 1741)    ED Course/ Medical Decision Making/ A&P                             Medical Decision Making Amount and/or Complexity of Data Reviewed Labs: ordered. Radiology: ordered.  Risk Prescription drug management. Decision regarding hospitalization.   Sandy Trujillo 89 y.o. presented today for AMS. Working DDx that I considered at this time includes, but not limited to, CVA, ICH, intracranial mass, critical dehydration, heptatic dysfunction, uremia, hypercarbia, intoxication, endocrine abnormality, toxidrome.  Review of prior external notes: 06/13/2022 discharge summary  Unique Tests and My Interpretation:  CT Head wo Contrast: Unremarkable CBC: Unremarkable CMP: Hyponatremia 123,  glucose 62, mild transaminitis UA: Not yet collected CXR: No acute cardiopulmonary changes TSH: Unremarkable Lactic acid: Negative BNP: 664.9, no previous results to compare EKG: Rate, rhythm, axis, intervals all examined and without medically relevant abnormality. ST segments without concerns for elevations  Discussion with Independent Historian:  Daughter  Discussion of Management of Tests:  Garba, MD hospitalist  Risk: High: hospitalization or escalation of hospital-level care  Risk Stratification Score: None  Staffed with Adela Lank, DO  R/o DDx: CVA, ICH, intracranial mass, critical dehydration, heptatic dysfunction, uremia, hypercarbia, intoxication, endocrine abnormality, toxidrome: Inconsistent with patient's presentation, history, physical exam, lab/imaging findings  Plan: Patient presented for AMS.  On exam patient was later bradycardic in the 50s with elevated blood pressure in the 180s.  Patient was altered but could follow minimal commands such as smiling.  Patient smile was symmetrical.  The rest of the neuroexam could not be performed as patient was unable to follow commands.  Patient did have 2+ bilateral pitting edema in both legs.  Labs and triage notes that patient has sodium 123 and clinically appears the patient has hypervolemic hyponatremia most likely secondary to her CHF.  Patient BNP was also elevated to 664.9.  Patient's glucose was initially 61 and so she was given 1 amp of D50.  Anticipate admission pending CT scan.  Patient CT came back largely unremarkable.  However with my independent interpretation the ventricles appear to be slightly enlarged in combination with patient's history of having unsteady gait and being fluid overloaded I suspect patient may have normal pressure hydrocephalus which would explain her symptoms versus hypervolemic hyponatremia.  Patient will be admitted to medicine.  I spoke to the hospitalist and patient was accepted for admission.   Patient stable for admission at this time.         Final Clinical Impression(s) / ED Diagnoses Final diagnoses:  Altered mental status, unspecified altered mental status type  Hyponatremia    Rx / DC Orders ED Discharge Orders     None         Sandy Trujillo 07/11/22 1953    Melene Plan, DO 07/11/22 2314

## 2022-07-12 ENCOUNTER — Inpatient Hospital Stay (HOSPITAL_COMMUNITY): Payer: Medicare Other

## 2022-07-12 DIAGNOSIS — G9341 Metabolic encephalopathy: Secondary | ICD-10-CM | POA: Diagnosis not present

## 2022-07-12 DIAGNOSIS — I5031 Acute diastolic (congestive) heart failure: Secondary | ICD-10-CM | POA: Diagnosis not present

## 2022-07-12 LAB — GLUCOSE, CAPILLARY
Glucose-Capillary: 110 mg/dL — ABNORMAL HIGH (ref 70–99)
Glucose-Capillary: 112 mg/dL — ABNORMAL HIGH (ref 70–99)
Glucose-Capillary: 126 mg/dL — ABNORMAL HIGH (ref 70–99)
Glucose-Capillary: 140 mg/dL — ABNORMAL HIGH (ref 70–99)
Glucose-Capillary: 64 mg/dL — ABNORMAL LOW (ref 70–99)
Glucose-Capillary: 70 mg/dL (ref 70–99)
Glucose-Capillary: 72 mg/dL (ref 70–99)
Glucose-Capillary: 81 mg/dL (ref 70–99)
Glucose-Capillary: 86 mg/dL (ref 70–99)
Glucose-Capillary: 94 mg/dL (ref 70–99)

## 2022-07-12 LAB — ECHOCARDIOGRAM COMPLETE
AR max vel: 1.7 cm2
AV Area VTI: 1.66 cm2
AV Area mean vel: 1.64 cm2
AV Mean grad: 5 mmHg
AV Peak grad: 10.2 mmHg
Ao pk vel: 1.6 m/s
Area-P 1/2: 4.86 cm2
Calc EF: 50.1 %
Height: 60 in
MV M vel: 6.16 m/s
MV Peak grad: 151.8 mmHg
MV VTI: 1.96 cm2
Radius: 0.6 cm
S' Lateral: 3.7 cm
Single Plane A2C EF: 53.1 %
Single Plane A4C EF: 51.9 %
Weight: 2109.36 oz

## 2022-07-12 LAB — BASIC METABOLIC PANEL
Anion gap: 9 (ref 5–15)
BUN: 15 mg/dL (ref 8–23)
CO2: 34 mmol/L — ABNORMAL HIGH (ref 22–32)
Calcium: 8.6 mg/dL — ABNORMAL LOW (ref 8.9–10.3)
Chloride: 83 mmol/L — ABNORMAL LOW (ref 98–111)
Creatinine, Ser: 0.61 mg/dL (ref 0.44–1.00)
GFR, Estimated: >60 mL/min (ref >60)
Glucose, Bld: 83 mg/dL (ref 70–99)
Potassium: 3.9 mmol/L (ref 3.5–5.1)
Sodium: 126 mmol/L — ABNORMAL LOW (ref 135–145)

## 2022-07-12 MED ORDER — LORATADINE 10 MG PO TABS
10.0000 mg | ORAL_TABLET | Freq: Every day | ORAL | Status: DC
  Administered 2022-07-12 – 2022-07-17 (×6): 10 mg via ORAL
  Filled 2022-07-12 (×6): qty 1

## 2022-07-12 MED ORDER — DEXTROSE 50 % IV SOLN
1.0000 | INTRAVENOUS | Status: DC | PRN
  Administered 2022-07-12: 25 mL via INTRAVENOUS
  Administered 2022-07-13: 50 mL via INTRAVENOUS
  Filled 2022-07-12 (×2): qty 50

## 2022-07-12 MED ORDER — ENSURE ENLIVE PO LIQD
237.0000 mL | Freq: Two times a day (BID) | ORAL | Status: DC
  Administered 2022-07-12 – 2022-07-17 (×8): 237 mL via ORAL

## 2022-07-12 NOTE — Progress Notes (Signed)
Hypoglycemic Event  CBG: 64  Treatment: 4 oz juice/soda  Symptoms: None  Follow-up CBG: Time:1137 CBG Result:86   Possible Reasons for Event: Inadequate meal intake      Sandy Trujillo O Sandy Trujillo

## 2022-07-12 NOTE — Evaluation (Signed)
Occupational Therapy Evaluation Patient Details Name: Sandy Trujillo MRN: [id number] DOB: 1932/09/02 Today's Date: 07/12/2022   History of Present Illness Pt is an 89 y/o female admitted secondary to AMS and bilateral LE edema. Pt found to have acute onset CHF and metabolic encephalopathy. PMH including but not limited to GERD, hyperparathyroidism, essential hypertension, history of ileostomy, osteoporosis, history of rectal cancer, history of anemia and atrial fibrillation on treatment.   Clinical Impression   Pt lives alone, but recently having a caregiver assist with IADLs. Family members visit regularly to encourage pt to walk and to eat and drink and have started staying with pt at night. Pt has been resistant to showering, likely not thorough with ADLs. Pt typically manages her ileostomy, but needing help in the days leading up to hospitalization. Pt presents with generalized weakness, decreased activity tolerance with reliance on 2L O2 to maintain saturations > 90%. Pt with hypoglycemia (64) at time of evaluation. Pt is slow to respond to questions and needing assistance of her family for accurate history. She is most comfortable sitting EOB and needs min assist for transfers. Patient will benefit from continued inpatient follow up therapy, <3 hours/day. Will follow acutely.     Recommendations for follow up therapy are one component of a multi-disciplinary discharge planning process, led by the attending physician.  Recommendations may be updated based on patient status, additional functional criteria and insurance authorization.   Assistance Recommended at Discharge Frequent or constant Supervision/Assistance  Patient can return home with the following A little help with walking and/or transfers;A lot of help with bathing/dressing/bathroom;Assistance with cooking/housework;Direct supervision/assist for medications management;Assist for transportation;Help with stairs or ramp for  entrance    Functional Status Assessment  Patient has had a recent decline in their functional status and demonstrates the ability to make significant improvements in function in a reasonable and predictable amount of time.  Equipment Recommendations  None recommended by OT    Recommendations for Other Services       Precautions / Restrictions Precautions Precautions: Fall Restrictions Weight Bearing Restrictions: No      Mobility Bed Mobility               General bed mobility comments: pt sitting EOB, declined assistance to supine    Transfers Overall transfer level: Needs assistance Equipment used: Rolling walker (2 wheels) Transfers: Sit to/from Stand, Bed to chair/wheelchair/BSC Sit to Stand: Min assist     Step pivot transfers: Min assist     General transfer comment: assist to rise and steady      Balance Overall balance assessment: Needs assistance Sitting-balance support: Feet supported Sitting balance-Leahy Scale: Fair     Standing balance support: During functional activity, Bilateral upper extremity supported Standing balance-Leahy Scale: Poor                             ADL either performed or assessed with clinical judgement   ADL Overall ADL's : Needs assistance/impaired Eating/Feeding: Set up;Sitting Eating/Feeding Details (indicate cue type and reason): cues to continue eating and drinking to get blood sugar up Grooming: Set up;Sitting   Upper Body Bathing: Moderate assistance;Sitting   Lower Body Bathing: Moderate assistance;Sit to/from stand   Upper Body Dressing : Minimal assistance;Sitting   Lower Body Dressing: Moderate assistance;Sit to/from stand   Toilet Transfer: Minimal assistance;BSC/3in1   Toileting- Clothing Manipulation and Hygiene: Minimal assistance;Sit to/from stand  Vision Ability to See in Adequate Light: 0 Adequate Patient Visual Report: No change from baseline        Perception     Praxis      Pertinent Vitals/Pain Pain Assessment Pain Assessment: No/denies pain     Hand Dominance Right   Extremity/Trunk Assessment Upper Extremity Assessment Upper Extremity Assessment: Generalized weakness   Lower Extremity Assessment Lower Extremity Assessment: Defer to PT evaluation   Cervical / Trunk Assessment Cervical / Trunk Assessment: Kyphotic   Communication Communication Communication: HOH   Cognition Arousal/Alertness: Awake/alert Behavior During Therapy: Flat affect Overall Cognitive Status: Impaired/Different from baseline Area of Impairment: Following commands, Safety/judgement, Awareness, Problem solving                       Following Commands: Follows one step commands with increased time Safety/Judgement: Decreased awareness of deficits Awareness: Intellectual Problem Solving: Slow processing, Decreased initiation, Difficulty sequencing, Requires verbal cues General Comments: generally slow, pt with blood glucose of 64     General Comments       Exercises     Shoulder Instructions      Home Living Family/patient expects to be discharged to:: Private residence Living Arrangements: Alone Available Help at Discharge: Family;Available PRN/intermittently Type of Home: House Home Access: Level entry     Home Layout: One level     Bathroom Shower/Tub: Producer, television/film/video: Handicapped height     Home Equipment: Agricultural consultant (2 wheels);Cane - single point;Shower seat   Additional Comments: has a friend that comes a couple of days a week for ~4 hours to assist with IADLs, not a true aide and just getting started so she has not been getting assistance with any ADLs yet, family has been staying nights with her and assisting with meals, encouraging her to eat and drink      Prior Functioning/Environment Prior Level of Function : Needs assist             Mobility Comments: ambulating with use  of a RW, sedentary unless cued to walk ADLs Comments: Pt has been refusing to shower x 5 weeks, eating and drinking only when cued.        OT Problem List: Decreased strength;Decreased activity tolerance;Impaired balance (sitting and/or standing);Decreased cognition;Decreased knowledge of use of DME or AE      OT Treatment/Interventions: Self-care/ADL training;DME and/or AE instruction;Therapeutic activities;Cognitive remediation/compensation;Patient/family education;Balance training    OT Goals(Current goals can be found in the care plan section) Acute Rehab OT Goals OT Goal Formulation: With patient/family Time For Goal Achievement: 08/02/22 Potential to Achieve Goals: Good ADL Goals Pt Will Perform Grooming: with supervision;standing Pt Will Perform Upper Body Dressing: with set-up;sitting Pt Will Perform Lower Body Dressing: with supervision;sit to/from stand Pt Will Transfer to Toilet: with supervision;ambulating;regular height toilet Pt Will Perform Toileting - Clothing Manipulation and hygiene: with supervision;sit to/from stand Additional ADL Goal #1: Pt will identify and gather items necessary for ADLs around her room with RW and supervision.  OT Frequency: Min 1X/week    Co-evaluation              AM-PAC OT "6 Clicks" Daily Activity     Outcome Measure Help from another person eating meals?: A Little Help from another person taking care of personal grooming?: A Little Help from another person toileting, which includes using toliet, bedpan, or urinal?: A Little Help from another person bathing (including washing, rinsing, drying)?: A Lot Help from another  person to put on and taking off regular upper body clothing?: A Little Help from another person to put on and taking off regular lower body clothing?: A Lot 6 Click Score: 16   End of Session Equipment Utilized During Treatment: Rolling walker (2 wheels);Oxygen  Activity Tolerance: Patient limited by  fatigue Patient left: in bed;with call bell/phone within reach;with family/visitor present  OT Visit Diagnosis: Unsteadiness on feet (R26.81);Muscle weakness (generalized) (M62.81);Other symptoms and signs involving cognitive function                Time: 1105-1136 OT Time Calculation (min): 31 min Charges:  OT General Charges $OT Visit: 1 Visit OT Evaluation $OT Eval Moderate Complexity: 1 Mod OT Treatments $Self Care/Home Management : 8-22 mins  Berna Spare, OTR/L Acute Rehabilitation Services Office: [phone]   Evern Bio 07/12/2022, 1:04 PM

## 2022-07-12 NOTE — Evaluation (Signed)
Physical Therapy Evaluation Patient Details Name: Sandy Trujillo MRN: [id number] DOB: Aug 12, 1932 Today's Date: 07/12/2022  History of Present Illness  Pt is an 89 y/o female admitted secondary to AMS and bilateral LE edema. Pt found to have acute onset CHF and metabolic encephalopathy. PMH including but not limited to GERD, hyperparathyroidism, essential hypertension, history of ileostomy, osteoporosis, history of rectal cancer, history of anemia and atrial fibrillation on treatment.   Clinical Impression  Pt presented sitting upright at EOB, awake and willing to participate in therapy session. Pt's two daughters and son were present throughout the evaluation, very attentive and supportive. Prior to admission, pt was ambulating with use of a RW and independent with ADLs. However, per pt's family, unsure of how thorough the pt was with completing ADLs and questionable how well she was able to take care of herself. Pt lives alone in a single level home with a level entry. At the time of evaluation, pt was very limited with mobility secondary to significant fatigue and weakness. Pt's family reporting that pt does not eat well and maybe consumes a total of 600 calories a day. They are also concerned with her water intake not being enough. Pt was able to complete two sit<>stand transfers from EOB with use of RW and min A. She was also able to stand statically for ~2 minutes x2 bouts with RW and min guard for safety. Pt only able to tolerate taking a few steps in place at EOB with RW and min A for stability but rapidly fatigued. Pt on 2L of O2 via [place] throughout with SpO2 decreasing to 87% with activity with steady recovery to >90% with sitting rest breaks and cueing for deeper breathing and pursed-lip breathing. Pt would continue to benefit from skilled physical therapy services at this time while admitted and after d/c to address the below listed limitations in order to improve overall safety and independence  with functional mobility.  Of note, pt, family and PT having an extensive conversation about HHPT versus SNF with most in agreement that pt would most benefit from short-term rehab at a SNF prior to returning home. However, the patient did not appear to be in agreement with this plan, but willing to think about her options and consider.        Recommendations for follow up therapy are one component of a multi-disciplinary discharge planning process, led by the attending physician.  Recommendations may be updated based on patient status, additional functional criteria and insurance authorization.  Follow Up Recommendations Can patient physically be transported by private vehicle: No     Assistance Recommended at Discharge Frequent or constant Supervision/Assistance  Patient can return home with the following  A lot of help with walking and/or transfers;A lot of help with bathing/dressing/bathroom;Assistance with cooking/housework;Assist for transportation;Help with stairs or ramp for entrance    Equipment Recommendations Other (comment) (defer to next venue of care)  Recommendations for Other Services       Functional Status Assessment Patient has had a recent decline in their functional status and demonstrates the ability to make significant improvements in function in a reasonable and predictable amount of time.     Precautions / Restrictions Precautions Precautions: Fall Restrictions Weight Bearing Restrictions: No      Mobility  Bed Mobility               General bed mobility comments: pt sitting upright at EOB upon PT arrival    Transfers Overall transfer level: Needs assistance  Equipment used: Rolling walker (2 wheels) Transfers: Sit to/from Stand Sit to Stand: Min assist           General transfer comment: increased time and effort, verbal and tactile cueing for safe hand placement with RW as well as for sequencing and technique. min A to achieve a full  upright standing position. pt completed sit<>stand transfer from EOB x2    Ambulation/Gait               General Gait Details: pt significantly limited secondary to fatigue but was able to take a few steps on each in side in place at EOB with RW and min A. pt unable to further progress ambulation secondary to fatigue at this time.  Stairs            Wheelchair Mobility    Modified Rankin (Stroke Patients Only)       Balance Overall balance assessment: Needs assistance Sitting-balance support: Feet supported Sitting balance-Leahy Scale: Fair     Standing balance support: During functional activity, Bilateral upper extremity supported Standing balance-Leahy Scale: Poor                               Pertinent Vitals/Pain Pain Assessment Pain Assessment: No/denies pain    Home Living Family/patient expects to be discharged to:: Private residence Living Arrangements: Alone Available Help at Discharge: Family;Available PRN/intermittently Type of Home: House Home Access: Level entry       Home Layout: One level Home Equipment: Agricultural consultant (2 wheels);Cane - single point;Shower seat Additional Comments: has a friend that comes a couple of days a week for ~4 hours to assist with IADLs, not a true aide and just getting started so she has not been getting assistance with any ADLs yet    Prior Function Prior Level of Function : Needs assist             Mobility Comments: ambulating with use of a RW ADLs Comments: per family report, pt has been independent wtih ADLs but unsure of how well she is acutally taking care of herself, particularly with bathing     Hand Dominance        Extremity/Trunk Assessment   Upper Extremity Assessment Upper Extremity Assessment: Defer to OT evaluation    Lower Extremity Assessment Lower Extremity Assessment: Difficult to assess due to impaired cognition;Generalized weakness    Cervical / Trunk  Assessment Cervical / Trunk Assessment: Kyphotic  Communication   Communication: HOH  Cognition Arousal/Alertness: Awake/alert Behavior During Therapy: Flat affect Overall Cognitive Status: Impaired/Different from baseline Area of Impairment: Following commands, Safety/judgement, Awareness, Problem solving                       Following Commands: Follows one step commands with increased time Safety/Judgement: Decreased awareness of deficits Awareness: Intellectual Problem Solving: Slow processing, Decreased initiation, Difficulty sequencing, Requires verbal cues          General Comments      Exercises     Assessment/Plan    PT Assessment Patient needs continued PT services  PT Problem List Decreased strength;Decreased activity tolerance;Decreased balance;Decreased mobility;Decreased coordination;Decreased cognition;Decreased knowledge of use of DME;Decreased safety awareness;Decreased knowledge of precautions;Cardiopulmonary status limiting activity       PT Treatment Interventions DME instruction;Gait training;Functional mobility training;Stair training;Therapeutic activities;Therapeutic exercise;Balance training;Neuromuscular re-education;Patient/family education    PT Goals (Current goals can be found in the Care Plan section)  Acute Rehab PT Goals Patient Stated Goal: to get stronger, to go on a cruise in December PT Goal Formulation: With patient/family Time For Goal Achievement: 07/26/22 Potential to Achieve Goals: Good    Frequency Min 1X/week     Co-evaluation               AM-PAC PT "6 Clicks" Mobility  Outcome Measure Help needed turning from your back to your side while in a flat bed without using bedrails?: A Lot Help needed moving from lying on your back to sitting on the side of a flat bed without using bedrails?: A Lot Help needed moving to and from a bed to a chair (including a wheelchair)?: A Lot Help needed standing up from a chair  using your arms (e.g., wheelchair or bedside chair)?: A Little Help needed to walk in hospital room?: A Lot Help needed climbing 3-5 steps with a railing? : Total 6 Click Score: 12    End of Session Equipment Utilized During Treatment: Gait belt;Oxygen Activity Tolerance: Patient limited by fatigue Patient left: in bed;with call bell/phone within reach;with family/visitor present;Other (comment) (sitting upright at EOB) Nurse Communication: Mobility status PT Visit Diagnosis: Other abnormalities of gait and mobility (R26.89)    Time: 1610-9604 PT Time Calculation (min) (ACUTE ONLY): 55 min   Charges:   PT Evaluation $PT Eval Moderate Complexity: 1 Mod PT Treatments $Therapeutic Activity: 23-37 mins $Self Care/Home Management: 8-22        Arletta Bale, DPT  Acute Rehabilitation Services Office [phone]   Alessandra Bevels Montie Gelardi 07/12/2022, 11:56 AM

## 2022-07-12 NOTE — Hospital Course (Addendum)
89 year old female living independently, history of rectal cancer with ileostomy, persistent atrial fibrillation on Eliquis recent failed cardioversion on 5/13.24, GERD, hypertension, osteoporosis hyperparathyroidism, anxiety disorder, AVM of small bowel  who is having intermittent altered mental status and needing family with her to assist with her ileostomy bag care , presented to the ED with altered mental status, bilateral leg swelling In the ED initially hypertensive 201/90, temp 95, hr 50-70s, Labs showed hyponatremia 123 hypoglycemia 62, BNP 664, CBC with anemia 11.3 g, TSH 1.193 Chest x-ray with mild pulmonary edema small bilateral pleural effusion and bibasilar atelectasis CT head no acute finding chronic microvascular ischemic changes and age-related atrophy. Patient was given D50 IV, insulin Lasix and admitted for further management. Given significant leg edema concerns for CHF exacerbation -Cardiology was consulted 6/17 TTE> EF 40-45%, with regional wall motion abnormalities moderately reduced RV systolic function severely elevated pulmonary artery systolic pressure with RV systolic pressure of 62.1, severely dilated left atrium, right atrium, moderate to large pericardial effusion concern for early tamponade physiology, large pleural effusion in the left lateral region.

## 2022-07-12 NOTE — TOC Initial Note (Addendum)
Transition of Care [facility]) - Initial/Assessment Note    Patient Details  Name: Sandy Trujillo MRN: [id number] Date of Birth: 1932/12/12  Transition of Care [facility]) CM/SW Contact:    Delilah Shan, LCSWA Phone Number: 07/12/2022, 2:18 PM  Clinical Narrative:                  CSW received consult for possible SNF placement at time of discharge. Patient gave permission for CSW to call her daughter Sandy Trujillo regarding PT recommendation of SNF placement at time of discharge.  CSW spoke with patients daughter Sandy Trujillo regarding PT recommendations for SNF placement at time of discharge.Patients daughter Sandy Trujillo  expressed understanding of PT recommendation and is agreeable for CSW to fax patient out near the [place] and [facility] area for  SNF placement for patient at time of discharge. Patients daughter Sandy Trujillo reports patients  preference is  for [facility] . CSW discussed insurance authorization process and will provide Medicare SNF ratings list with accepted SNF bed offers when available.  No further questions reported at this time. Patients passr currently pending. CSW will submit requested clinicals to [place] must for review.CSW to continue to follow and assist with discharge planning needs.   Expected Discharge Plan: Skilled Nursing Facility Barriers to Discharge: Continued Medical Work up   Patient Goals and CMS Choice Patient states their goals for this hospitalization and ongoing recovery are:: SNF CMS Medicare.gov Compare Post Acute Care list provided to:: Patient Represenative (must comment) Choice offered to / list presented to : Adult Children (daughter Sandy Trujillo)      Expected Discharge Plan and Services In-house Referral: Clinical Social Work     Living arrangements for the past 2 months: Single Family Home                                      Prior Living Arrangements/Services Living arrangements for the past 2 months: Single Family Home Lives with:: Self Patient language  and need for interpreter reviewed:: Yes Do you feel safe going back to the place where you live?: No   SNF  Need for Family Participation in Patient Care: Yes (Comment) Care giver support system in place?: Yes (comment)   Criminal Activity/Legal Involvement Pertinent to Current Situation/Hospitalization: No - Comment as needed  Activities of Daily Living Home Assistive Devices/Equipment: Ostomy supplies, Cane (specify quad or straight) ADL Screening (condition at time of admission) Patient's cognitive ability adequate to safely complete daily activities?: Yes Is the patient deaf or have difficulty hearing?: Yes Does the patient have difficulty seeing, even when wearing glasses/contacts?: No Does the patient have difficulty concentrating, remembering, or making decisions?: No Patient able to express need for assistance with ADLs?: Yes Does the patient have difficulty dressing or bathing?: Yes Independently performs ADLs?: Yes (appropriate for developmental age) Does the patient have difficulty walking or climbing stairs?: Yes Weakness of Legs: Both Weakness of Arms/Hands: Both  Permission Sought/Granted Permission sought to share information with : Case Manager, Magazine features editor, Family Supports Permission granted to share information with : Yes, Verbal Permission Granted  Share Information with NAME: Sandy Trujillo  Permission granted to share info w AGENCY: SNF  Permission granted to share info w Relationship: Daughter  Permission granted to share info w Contact Information: Sandy Trujillo [phone]  Emotional Assessment Appearance:: Appears stated age Attitude/Demeanor/Rapport: Gracious Affect (typically observed): Calm Orientation: : Oriented to Self, Oriented to Place, Oriented to  Time,  Oriented to Situation Alcohol / Substance Use: Not Applicable Psych Involvement: No (comment)  Admission diagnosis:  Hyponatremia [E87.1] Altered mental status, unspecified altered mental  status type [R41.82] Acute metabolic encephalopathy [G93.41] Patient Active Problem List   Diagnosis Date Noted   Acute metabolic encephalopathy 07/11/2022   Acute on chronic combined systolic and diastolic CHF (congestive heart failure) (HCC) 07/11/2022   Hypoglycemia 07/11/2022   Hypothermia 07/11/2022   Melena 06/11/2022   Acute blood loss anemia 06/11/2022   AVM (arteriovenous malformation) of small bowel, acquired with hemorrhage 06/11/2022   Symptomatic anemia 06/10/2022   GIB (gastrointestinal bleeding) 06/10/2022   Hyponatremia 06/10/2022   Hypocalcemia 06/10/2022   AV block, 2nd degree 06/09/2022   Persistent atrial fibrillation (HCC) 05/25/2022   Hypercoagulable state due to persistent atrial fibrillation (HCC) 05/25/2022   PAF (paroxysmal atrial fibrillation) (HCC) 05/09/2022   Pleural effusion on right 05/09/2022   Ileostomy in place [facility]) 05/09/2022   Anemia 05/09/2022   HYPERLIPIDEMIA 04/18/2007   Allergic rhinitis 04/18/2007   PULMONARY NODULE 04/18/2007   CARCINOMA, RECTUM 04/17/2007   ANXIETY 04/17/2007   Essential hypertension 04/17/2007   COLITIS, ULCERATIVE 04/17/2007   PCP:  Thana Ates, MD Pharmacy:   [id number] [place], [place] - [address] [place] [address] Phone: [phone] Fax: [phone]  [facility] [address] [place] [address] Phone: [phone] Fax: [phone]     Social Determinants of Health (SDOH) Social History: SDOH Screenings   Food Insecurity: No Food Insecurity (07/11/2022)  Housing: Patient Unable To Answer (07/11/2022)  Transportation Needs: No Transportation Needs (07/11/2022)  Utilities: Not At Risk (07/11/2022)  Alcohol Screen: Low Risk  (07/11/2022)  Depression (PHQ2-9): Low Risk  (07/11/2022)  Financial Resource Strain: Low Risk  (07/11/2022)  Physical Activity: Insufficiently Active (07/11/2022)  Stress: No Stress Concern Present (07/11/2022)   Tobacco Use: Low Risk  (07/11/2022)   SDOH Interventions:     Readmission Risk Interventions     No data to display

## 2022-07-12 NOTE — Progress Notes (Signed)
Glucose is 72

## 2022-07-12 NOTE — Progress Notes (Signed)
Initial Nutrition Assessment  DOCUMENTATION CODES:   Not applicable  INTERVENTION:  - Continue Ensure Enlive po BID, each supplement provides 350 kcal and 20 grams of protein.  - Add MVI q day.   NUTRITION DIAGNOSIS:   Inadequate oral intake related to poor appetite as evidenced by per patient/family report.  GOAL:   Patient will meet greater than or equal to 90% of their needs  MONITOR:   PO intake  REASON FOR ASSESSMENT:   Malnutrition Screening Tool    ASSESSMENT:   89 y.o. female admits related to AMS and bilateral leg swelling. PMH includes: anemia, anxiety, Bell's palsy, GERD, hyperparathyroidism, HTN, osteoporosis. Pt is currently receiving medical management related to acute metabolic encephalopathy.  Meds reviewed: lasix. Labs reviewed: Na low.   The pt was receiving patient care at time of assessment. Spoke with RN who reports that the pt ate about 50% of a breakfast sandwich that a family member brought in and family also brought [employer] for lunch. Pt has Ensure Enlive ordered BID. Pt has accepted one daily. Per record, pt has experienced a 10% wt loss in 2 months, which is significant.   NUTRITION - FOCUSED PHYSICAL EXAM:  Attempt at f/u.  Diet Order:   Diet Order             Diet Heart Room service appropriate? Yes; Fluid consistency: Thin  Diet effective now                   EDUCATION NEEDS:   Not appropriate for education at this time  Skin:  Skin Assessment: Reviewed RN Assessment  Last BM:  150 mL via colostomy in past 24 hrs  Height:   Ht Readings from Last 1 Encounters:  07/11/22 5' (1.524 m)    Weight:   Wt Readings from Last 1 Encounters:  07/13/22 57.3 kg    Ideal Body Weight:     BMI:  Body mass index is 24.67 kg/m.  Estimated Nutritional Needs:   Kcal:  1500-1800 kcals  Protein:  75-90 gm  Fluid:  1.5-1.8 L  Bethann Humble, RD, LDN, CNSC.

## 2022-07-12 NOTE — Progress Notes (Signed)
PROGRESS NOTE Sandy Trujillo  OVF:[id number] DOB: January 28, 1932 DOA: 07/11/2022 PCP: Thana Ates, MD  Brief Narrative/Hospital Course: 89 year old female living independently, history of rectal cancer with ileostomy, persistent atrial fibrillation on Eliquis recent failed cardioversion on 5/13.24, GERD, hypertension, osteoporosis hyperparathyroidism, anxiety disorder, AVM of small bowel  who is having intermittent altered mental status and needing family with her to assist with her ileostomy bag care , presented to the ED with altered mental status, bilateral leg swelling In the ED initially hypertensive 201/90, temp 95, hr 50-70s, Labs showed hyponatremia 123 hypoglycemia 62, BNP 664, CBC with anemia 11.3 g, TSH 1.193 Chest x-ray with mild pulmonary edema small bilateral pleural effusion and bibasilar atelectasis CT head no acute finding chronic microvascular ischemic changes and age-related atrophy. Patient was given D50 IV, insulin Lasix and admitted for further management.     Subjective: Seen this am Appears much clear and back to baseline per daughter Legs swollen 3+    Assessment and Plan: Principal Problem:   Acute metabolic encephalopathy Active Problems:   PAF (paroxysmal atrial fibrillation) (HCC)   ANXIETY   Essential hypertension   CARCINOMA, RECTUM   Hyponatremia   Acute on chronic combined systolic and diastolic CHF (congestive heart failure) (HCC)   Hypoglycemia   Hypothermia   Leg swelling/overload New onset CHF: Presenting with leg swelling elevated BNP chest x-ray with pulmonary edema pleural effusion.  Currently rate controlled-on 50s.  Follow-up echocardiogram, continue diuresis.  Will consult patient's primary cardiology team. Cont to monitor daily I/O,weight, electrolytes and net balance as below.Keep on  salt/fluid restricted diet and monitor in tele. Net IO Since Admission: -225 mL [07/12/22 0839]  Filed Weights   07/11/22 1605 07/11/22 2226 07/12/22  0500  Weight: 60 kg 59 kg 59.8 kg    Recent Labs  Lab 07/11/22 1501 07/11/22 1503 07/12/22 0217  BNP 664.9*  --   --   BUN  --  14 15  CREATININE  --  0.70 0.61  K  --  4.0 3.9    Persistent atrial fibrillation recent bili had cardioversion 06/23/2021 but failed, continue her Eliquis rate is well controlled.  Acute metabolic encephalopathy: Patient was slow to response.CT head age-related changes.  TSH stable does have hyponatremia.  Temperature on lower side, but was hypoglycemic which could explain .  Likely multifactorial encephalopathy overall improved continue supportive care treat underlying hypoglycemia, keep on delirium precaution fall precaution PT OT. UA WAS Normal at [facility] per daughter 6/16, denied urinary symptoms.  Hypoglycemia: Not on OHA and no known history of diabetes.did not eat yesterday. Monitor. Encourage po Recent Labs  Lab 07/11/22 2147 07/12/22 0006 07/12/22 0403 07/12/22 0530 07/12/22 0738  GLUCAP 140* 110* 72 126* 81    Hypervolemia hyponatremia: improving on Lasix.  Monitor Recent Labs  Lab 07/11/22 1503 07/12/22 0217  NA 123* 126*    History of rectal cancer ileostomy in place-able ot manage self at home.  DVT prophylaxis: Eliquis Code Status:   Code Status: Full Code Family Communication: plan of care discussed with patient/daughter at bedside. Patient status is:  inpatient because of due to anasarca Level of care: Telemetry Cardiac   Dispo: The patient is from: Home lives alone             Anticipated disposition: Tbd. PTOT eval Objective: Vitals last 24 hrs: Vitals:   07/12/22 0433 07/12/22 0500 07/12/22 0741 07/12/22 0816  BP: (!) 161/68  (!) 147/67 131/72  Pulse: (!) 55  70  Resp: 17  (!) 23   Temp: (!) 95.9 F (35.5 C)  97.6 F (36.4 C)   TempSrc: Temporal  Oral   SpO2: 97%  98%   Weight:  59.8 kg    Height:       Weight change:   Physical Examination: General exam: alert awake,oriented x3 older than stated  age HEENT:Oral mucosa moist, Ear/Nose WNL grossly Respiratory system: bilaterally crackles, no use of accessory muscle Cardiovascular system: S1 & S2 +, No JVD. Gastrointestinal system: Abdomen soft, ileostomy in place with stool, NT,ND, BS+ Nervous System:Alert, awake, moving extremities. Extremities: LE edema 3+,distal peripheral pulses palpable.  Skin: No rashes,no icterus. MSK: Normal muscle bulk,tone, power  Medications reviewed:  Scheduled Meds:  apixaban  5 mg Oral BID   dextrose  1 Tube Oral STAT   feeding supplement  237 mL Oral BID BM   furosemide  40 mg Intravenous BID   lisinopril  10 mg Oral Daily   nystatin   Topical TID   pantoprazole  40 mg Oral Daily   sodium chloride flush  3 mL Intravenous Q12H   Continuous Infusions:  sodium chloride        Diet Order             Diet Heart Room service appropriate? Yes; Fluid consistency: Thin  Diet effective now                   Intake/Output Summary (Last 24 hours) at 07/12/2022 0837 Last data filed at 07/12/2022 0522 Gross per 24 hour  Intake --  Output 225 ml  Net -225 ml   Net IO Since Admission: -225 mL [07/12/22 0837]  Wt Readings from Last 3 Encounters:  07/12/22 59.8 kg  06/28/22 59.6 kg  06/22/22 58.6 kg     Unresulted Labs (From admission, onward)     Start     Ordered   07/12/22 0500  Basic metabolic panel  Daily,   R     Comments: As Scheduled for 5 days    07/11/22 2353   07/11/22 1633  Urinalysis, Routine w reflex microscopic -Urine, Catheterized  Once,   URGENT       Question:  Specimen Source  Answer:  Urine, Catheterized   07/11/22 1634          Data Reviewed: I have personally reviewed following labs and imaging studies CBC: Recent Labs  Lab 07/11/22 1503  WBC 5.1  HGB 11.3*  HCT 36.0  MCV 87.2  PLT 174   Basic Metabolic Panel: Recent Labs  Lab 07/11/22 1503 07/12/22 0217  NA 123* 126*  K 4.0 3.9  CL 80* 83*  CO2 31 34*  GLUCOSE 62* 83  BUN 14 15   CREATININE 0.70 0.61  CALCIUM 9.0 8.6*  ZOX:[id number] Creatinine Clearance: 40 mL/min (by C-G formula based on SCr of 0.61 mg/dL). Liver Function Tests: Recent Labs  Lab 07/11/22 1503  AST 62*  ALT 51*  ALKPHOS 76  BILITOT 1.4*  PROT 6.7  ALBUMIN 3.5   Recent Labs  Lab 07/11/22 2147 07/12/22 0006 07/12/22 0403 07/12/22 0530 07/12/22 0738  GLUCAP 140* 110* 72 126* 81   Lipid Profile: No results for input(s): "CHOL", "HDL", "LDLCALC", "TRIG", "CHOLHDL", "LDLDIRECT" in the last 72 hours. Thyroid Function Tests: Recent Labs    07/11/22 1501  TSH 1.193   Sepsis Labs: Recent Labs  Lab 07/11/22 1633 07/11/22 2209  LATICACIDVEN 0.9 0.9    No results found for this or  any previous visit (from the past 240 hour(s)).  Antimicrobials: Anti-infectives (From admission, onward)    None      Culture/Microbiology    Component Value Date/Time   SDES URINE, CLEAN CATCH 06/10/2022 1024   SPECREQUEST NONE 06/10/2022 1024   CULT (A) 06/10/2022 1024    <10,000 COLONIES/mL INSIGNIFICANT GROWTH Performed at [facility], [address], [place], [place] [address]    REPTSTATUS 06/11/2022 FINAL 06/10/2022 1024   Radiology Studies: CT Head Wo Contrast  Result Date: 07/11/2022 CLINICAL DATA:  Altered mental status. EXAM: CT HEAD WITHOUT CONTRAST TECHNIQUE: Contiguous axial images were obtained from the base of the skull through the vertex without intravenous contrast. RADIATION DOSE REDUCTION: This exam was performed according to the departmental dose-optimization program which includes automated exposure control, adjustment of the mA and/or kV according to patient size and/or use of iterative reconstruction technique. COMPARISON:  None Available. FINDINGS: Brain: Mild age-related atrophy and chronic microvascular ischemic changes. There is no acute intracranial hemorrhage. No mass effect or midline shift. No extra-axial fluid collection. Vascular: No hyperdense vessel or  unexpected calcification. Skull: Normal. Negative for fracture or focal lesion. Sinuses/Orbits: No acute finding. Other: None IMPRESSION: 1. No acute intracranial pathology. 2. Mild age-related atrophy and chronic microvascular ischemic changes. Electronically Signed   By: Elgie Collard M.D.   On: 07/11/2022 19:01   DG Chest 2 View  Result Date: 07/11/2022 CLINICAL DATA:  SOB EXAM: CHEST - 2 VIEW COMPARISON:  June 30, 2022 FINDINGS: The cardiomediastinal silhouette is unchanged in contour. Small bilateral pleural effusions, mildly increased. No pneumothorax. Mildly increased bibasilar homogeneous platelike opacities. Mildly increased interstitial markings diffusely. Visualized abdomen is unremarkable. Mild multilevel degenerative changes of the thoracic spine. IMPRESSION: Constellation of findings are favored to reflect mild pulmonary edema with increased small bilateral pleural effusions and bibasilar atelectasis. Electronically Signed   By: Meda Klinefelter M.D.   On: 07/11/2022 15:47     LOS: 1 day   Lanae Boast, MD [facility]  07/12/2022, 8:37 AM

## 2022-07-12 NOTE — Progress Notes (Signed)
Unable to obtain temperature by mouth or axillary. Rectum surgically closed. Temporal temp is 95.5. Body warmer applied.

## 2022-07-12 NOTE — Progress Notes (Addendum)
RE: Sandy Trujillo. Satre  Date of Birth: 02-23-1932  Date: 07/12/2022  To Whom It May Concern:  Please be advised that the above-named patient will require a short-term nursing home stay - anticipated 30 days or less for rehabilitation and strengthening. The plan is for return home.

## 2022-07-12 NOTE — Consult Note (Addendum)
Cardiology Consultation   Patient ID: Sandy Trujillo MRN: [id number]; DOB: Mar 27, 1932  Admit date: 07/11/2022 Date of Consult: 07/12/2022  PCP:  Thana Ates, MD   [facility] Providers Cardiologist:  Reatha Harps, MD  Electrophysiologist:  Maurice Small, MD       Patient Profile:   Sandy Trujillo is a 89 y.o. female with a hx of persistent atrial fibrillation, HTN, HLD, rectal carcinoma s/p colectomy, ulcerative colitis and GI bleed secondary to AVM who is being seen 07/12/2022 for the evaluation of possible CHF at the request of Dr. Jonathon Bellows.  History of Present Illness:   Sandy Trujillo is a 89 year old female with past medical history of persistent atrial fibrillation, HTN, HLD, rectal carcinoma s/p colectomy, ulcerative colitis and GI bleed secondary to AVM.  Patient was initially seen by cardiology service in April 2024 during hospitalization for new onset of atrial fibrillation.  She was not started on AV nodal blocking agents secondary to self rate control and the nocturnal bradycardia (which is not new per her Apple Watch data).  She was started on Eliquis and is scheduled to see A-fib clinic.  She eventually underwent outpatient DCCV on 06/07/2022 and converted to sinus bradycardia with intermittent Wenckbach and runs of atrial tach.  She reported symptomatic bradycardia was presyncope during office visit in A-fib clinic on 5/15 and was sent to the ED for consideration of urgent pacemaker placement.  She was found to be anemic with hemoglobin 7.9 which subsequently dropped down to 6.6 on 5/15.  She was transfused with 1 unit of packed red blood cell and was seen by GI.  She was found to have AVM of the small bowel.  Anticoagulation therapy held until 5/20.  It was decided to hold off on pacemaker implantation.  She was last seen by Carlos Levering NP on 06/14/2022 at which time family was concerned she was having gradual functional decline and did not wish to do  anything during the day.  She was seen by Dr. Nelly Laurence of EP service on 06/28/2022, family repeated they are concerned that patient has had significant functional decline.  Heart monitor was ordered to assess for symptomatic bradycardia.  Heart monitor showed a 100% A-fib burden, heart rate ranges from 39-93 bpm, average 65 bpm, rare PAC and PVCs, single run of 5 beats nonsustained VT.   Patient presented to [facility] on 07/11/2022 with altered mental status.  According to the daughter, she has noticed significant functional decline in the recent month and that she has also been having leg edema in the past few weeks as well.  Patient denies any significant shortness of breath.  Family is concerned that she has been slow to respond to questions and has very poor appetite.  Previously, she would be able to walk somewhere between 6000-10,000 steps in a day and took care of herself.  Now she came only walks slowly to the recliner and would sit in the recliner all day.  She says that a few days she was in sinus rhythm made no difference to her functional ability and how she felt.  Chest x-ray obtained on 07/11/2022 showed possible pulmonary edema with bilateral pleural effusion.  Sodium level was low at 123.  Creatinine 0.70.  Albumin 3.5.  BNP 664.9.  Lactic acid 0.9.  Hemoglobin improved to 11.3.  TSH normal.  CT of the head without contrast showed no acute intracranial pathology.  EKG showed rate controlled atrial fibrillation.  Overnight  on telemetry, her heart rate has been largely in the 50 to 60s range, with only 2 episodes where her heart rate dipped down to the high 30s in the middle of the night.  Cardiology service consulted for heart failure.  Patient has been placed on 40 mg twice daily of IV Lasix.  Blood pressure elevated on arrival.  She was left on home lisinopril 10 mg dosage, blood pressure is improving without further intervention.  Echocardiogram is currently pending.   Past Medical  History:  Diagnosis Date   Anemia    Anxiety    Bell's palsy    GERD (gastroesophageal reflux disease)    Hyperparathyroidism (HCC) 2019   Hypertension    Ileostomy in place [facility])    Osteoporosis 2019   Pneumonia    hx of   rectal ca 2007   surg only    Past Surgical History:  Procedure Laterality Date   ABDOMINAL HYSTERECTOMY     BREAST EXCISIONAL BIOPSY Bilateral    benign   CARDIOVERSION N/A 06/07/2022   Procedure: CARDIOVERSION;  Surgeon: Sande Rives, MD;  Location: [facility] INVASIVE CV LAB;  Service: Cardiovascular;  Laterality: N/A;   COLON SURGERY  2007   ESOPHAGOGASTRODUODENOSCOPY (EGD) WITH PROPOFOL N/A 06/11/2022   Procedure: ESOPHAGOGASTRODUODENOSCOPY (EGD) WITH PROPOFOL;  Surgeon: Sherrilyn Rist, MD;  Location: [facility] ENDOSCOPY;  Service: Gastroenterology;  Laterality: N/A;   HEMOSTASIS CLIP PLACEMENT  06/11/2022   Procedure: HEMOSTASIS CLIP PLACEMENT;  Surgeon: Sherrilyn Rist, MD;  Location: [facility] ENDOSCOPY;  Service: Gastroenterology;;   HOT HEMOSTASIS N/A 06/11/2022   Procedure: HOT HEMOSTASIS (ARGON PLASMA COAGULATION/BICAP);  Surgeon: Sherrilyn Rist, MD;  Location: [facility] ENDOSCOPY;  Service: Gastroenterology;  Laterality: N/A;   PARATHYROIDECTOMY N/A 05/26/2021   Procedure: NECK EXPLORATION WITH RIGHT INFERIOR PARATHYROIDECTOMY;  Surgeon: Darnell Level, MD;  Location: [facility] ORS;  Service: General;  Laterality: N/A;   surgery for spin al stenosis      bone spur touching nerve per pt     Home Medications:  Prior to Admission medications   Medication Sig Start Date End Date Taking? Authorizing Provider  acetaminophen (TYLENOL) 500 MG tablet Take 500-1,000 mg by mouth every 6 (six) hours as needed (pain.).   Yes [provider]  ALPRAZolam (XANAX) 0.25 MG tablet Take 0.25 mg by mouth 2 (two) times daily as needed. 06/22/22  Yes [provider]  apixaban (ELIQUIS) 5 MG TABS tablet Take 1 tablet (5 mg total) by mouth 2 (two) times daily. Resume from  06/14/2022 as per cardiology/GI recommendations 06/14/22 07/14/22 Yes Glade Lloyd, MD  ferrous sulfate 325 (65 FE) MG tablet Take 325 mg by mouth daily at 12 noon.   Yes [provider]  lisinopril (ZESTRIL) 10 MG tablet Take 1 tablet (10 mg total) by mouth daily. 06/13/22  Yes Glade Lloyd, MD  loratadine (CLARITIN) 5 MG chewable tablet Chew 5 mg by mouth daily.   Yes [provider]  Multiple Vitamins-Minerals (CENTRUM SILVER PO) Take 1 tablet by mouth daily.   Yes [provider]  ondansetron (ZOFRAN-ODT) 4 MG disintegrating tablet Take 4 mg by mouth 2 (two) times daily. 06/30/22  Yes [provider]  pantoprazole (PROTONIX) 40 MG tablet Take 1 tablet (40 mg total) by mouth daily. 06/13/22  Yes Glade Lloyd, MD    Inpatient Medications: Scheduled Meds:  apixaban  5 mg Oral BID   dextrose  1 Tube Oral STAT   feeding supplement  237 mL Oral  BID BM   furosemide  40 mg Intravenous BID   lisinopril  10 mg Oral Daily   nystatin   Topical TID   pantoprazole  40 mg Oral Daily   sodium chloride flush  3 mL Intravenous Q12H   Continuous Infusions:  sodium chloride     PRN Meds: sodium chloride, acetaminophen, ALPRAZolam, dextrose, ondansetron (ZOFRAN) IV, sodium chloride flush  Allergies:    Allergies  Allergen Reactions   Actonel [Risedronate] Other (See Comments)    Lowered BP   Buspar [Buspirone] Other (See Comments)    Increased risk for Afib   Sertraline Diarrhea    Social History:   Social History   Socioeconomic History   Marital status: Widowed    Spouse name: Not on file   Number of children: Not on file   Years of education: Not on file   Highest education level: Not on file  Occupational History   Not on file  Tobacco Use   Smoking status: Never   Smokeless tobacco: Never   Tobacco comments:    Never smoke 05/25/22  Vaping Use   Vaping Use: Never used  Substance and Sexual Activity   Alcohol use: Not Currently   Drug use:  Never   Sexual activity: Not on file  Other Topics Concern   Not on file  Social History Narrative   Not on file   Social Determinants of Health   Financial Resource Strain: Low Risk  (07/11/2022)   Overall Financial Resource Strain (CARDIA)    Difficulty of Paying Living Expenses: Not hard at all  Food Insecurity: No Food Insecurity (07/11/2022)   Hunger Vital Sign    Worried About Running Out of Food in the Last Year: Never true    Ran Out of Food in the Last Year: Never true  Transportation Needs: No Transportation Needs (07/11/2022)   PRAPARE - Administrator, Civil Service (Medical): No    Lack of Transportation (Non-Medical): No  Physical Activity: Insufficiently Active (07/11/2022)   Exercise Vital Sign    Days of Exercise per Week: 3 days    Minutes of Exercise per Session: 30 min  Stress: No Stress Concern Present (07/11/2022)   [employer] of Occupational Health - Occupational Stress Questionnaire    Feeling of Stress : Only a little  Social Connections: Not on file  Intimate Partner Violence: Not At Risk (07/11/2022)   Humiliation, Afraid, Rape, and Kick questionnaire    Fear of Current or Ex-Partner: No    Emotionally Abused: No    Physically Abused: No    Sexually Abused: No    Family History:   History reviewed. No pertinent family history.   ROS:  Please see the history of present illness.   All other ROS reviewed and negative.     Physical Exam/Data:   Vitals:   07/12/22 0500 07/12/22 0741 07/12/22 0816 07/12/22 1115  BP:  (!) 147/67 131/72 (!) 135/57  Pulse:  70  69  Resp:  (!) 23  19  Temp:  97.6 F (36.4 C)  (!) 97.4 F (36.3 C)  TempSrc:  Oral  Oral  SpO2:  98%  100%  Weight: 59.8 kg     Height:        Intake/Output Summary (Last 24 hours) at 07/12/2022 1525 Last data filed at 07/12/2022 1510 Gross per 24 hour  Intake --  Output 1325 ml  Net -1325 ml      07/12/2022    5:00  AM 07/11/2022   10:26 PM 07/11/2022    4:05 PM   Last 3 Weights  Weight (lbs) 131 lb 13.4 oz 130 lb 1.1 oz 132 lb 4.4 oz  Weight (kg) 59.8 kg 59 kg 60 kg     Body mass index is 25.75 kg/m.  General:  Well nourished, well developed, in no acute distress HEENT: normal Neck: no JVD Vascular: No carotid bruits; Distal pulses 2+ bilaterally Cardiac:  irregularly irregular; no murmur  Lungs:  Diminished breath sound, R>L Abd: soft, nontender, no hepatomegaly  Ext: no edema Musculoskeletal:  No deformities, BUE and BLE strength normal and equal Skin: warm and dry  Neuro:  CNs 2-12 intact, no focal abnormalities noted Psych:  Normal affect   EKG:  The EKG was personally reviewed and demonstrates:  atrial fibrillation with rate control Telemetry:  Telemetry was personally reviewed and demonstrates:  atrial fibrillation with HR 50-60s  Relevant CV Studies:  Echo 05/10/2022  1. Left ventricular ejection fraction, by estimation, is 50 to 55%. The  left ventricle has low normal function. The left ventricle has no regional  wall motion abnormalities. Left ventricular diastolic parameters are  indeterminate.   2. Right ventricular systolic function is mildly reduced. The right  ventricular size is mildly enlarged. There is mildly elevated pulmonary  artery systolic pressure.   3. Left atrial size was severely dilated.   4. Right atrial size was moderately dilated.   5. The mitral valve is normal in structure. Moderate mitral valve  regurgitation. No evidence of mitral stenosis.   6. Tricuspid valve regurgitation is moderate.   7. The aortic valve is tricuspid. There is mild calcification of the  aortic valve. There is mild thickening of the aortic valve. Aortic valve  regurgitation is not visualized. No aortic stenosis is present.   8. The inferior vena cava is normal in size with greater than 50%  respiratory variability, suggesting right atrial pressure of 3 mmHg.   Laboratory Data:  High Sensitivity Troponin:  No results for  input(s): "TROPONINIHS" in the last 720 hours.   Chemistry Recent Labs  Lab 07/11/22 1503 07/12/22 0217  NA 123* 126*  K 4.0 3.9  CL 80* 83*  CO2 31 34*  GLUCOSE 62* 83  BUN 14 15  CREATININE 0.70 0.61  CALCIUM 9.0 8.6*  GFRNONAA >60 >60  ANIONGAP 12 9    Recent Labs  Lab 07/11/22 1503  PROT 6.7  ALBUMIN 3.5  AST 62*  ALT 51*  ALKPHOS 76  BILITOT 1.4*   Lipids No results for input(s): "CHOL", "TRIG", "HDL", "LABVLDL", "LDLCALC", "CHOLHDL" in the last 168 hours.  Hematology Recent Labs  Lab 07/11/22 1503  WBC 5.1  RBC 4.13  HGB 11.3*  HCT 36.0  MCV 87.2  MCH 27.4  MCHC 31.4  RDW 15.9*  PLT 174   Thyroid  Recent Labs  Lab 07/11/22 1501  TSH 1.193    BNP Recent Labs  Lab 07/11/22 1501  BNP 664.9*    DDimer No results for input(s): "DDIMER" in the last 168 hours.   Radiology/Studies:  CT Head Wo Contrast  Result Date: 07/11/2022 CLINICAL DATA:  Altered mental status. EXAM: CT HEAD WITHOUT CONTRAST TECHNIQUE: Contiguous axial images were obtained from the base of the skull through the vertex without intravenous contrast. RADIATION DOSE REDUCTION: This exam was performed according to the departmental dose-optimization program which includes automated exposure control, adjustment of the mA and/or kV according to patient size and/or use of iterative  reconstruction technique. COMPARISON:  None Available. FINDINGS: Brain: Mild age-related atrophy and chronic microvascular ischemic changes. There is no acute intracranial hemorrhage. No mass effect or midline shift. No extra-axial fluid collection. Vascular: No hyperdense vessel or unexpected calcification. Skull: Normal. Negative for fracture or focal lesion. Sinuses/Orbits: No acute finding. Other: None IMPRESSION: 1. No acute intracranial pathology. 2. Mild age-related atrophy and chronic microvascular ischemic changes. Electronically Signed   By: Elgie Collard M.D.   On: 07/11/2022 19:01   DG Chest 2  View  Result Date: 07/11/2022 CLINICAL DATA:  SOB EXAM: CHEST - 2 VIEW COMPARISON:  June 30, 2022 FINDINGS: The cardiomediastinal silhouette is unchanged in contour. Small bilateral pleural effusions, mildly increased. No pneumothorax. Mildly increased bibasilar homogeneous platelike opacities. Mildly increased interstitial markings diffusely. Visualized abdomen is unremarkable. Mild multilevel degenerative changes of the thoracic spine. IMPRESSION: Constellation of findings are favored to reflect mild pulmonary edema with increased small bilateral pleural effusions and bibasilar atelectasis. Electronically Signed   By: Meda Klinefelter M.D.   On: 07/11/2022 15:47     Assessment and Plan:   Altered mental status/functional decline/significant weakness: Unclear cause.  Even with cardioversion in May 2024, she did not feel any improvement in her weakness.  She has since reverted back to atrial fibrillation and is currently in persistent atrial fibrillation.  She was seen by EP for questionable symptomatic bradycardia, heart monitor was obtained, average heart rate has been around 60s.  There was a few bradycardic episode in the upper 30s, however this occurred in the middle of the night.  I do not think her heart rate explains her progressive decline.  She also has hyponatremia.  She has a history of hyponatremia, will follow-up on sodium level with IV diuresis.  She denies any aspiration risk.    Leg edema: On 40 mg twice daily of IV Lasix.  Leg edema has been going on for few weeks according to family member. I/O not accurate.  TSH normal.  Albumin level was 3.5.  Chest x-ray showed likely pulmonary edema with pleural effusion.  I am not confident that loss of atrial kick with atrial fibrillation is capable causing leg edema and functional decline.  Persistent atrial fibrillation: Heart rate in the 60s.  On Eliquis.  Not on any AV nodal blocking agent given nocturnal bradycardia and is self rate  controlled.  Followed by EP service.  She did not feel any improvement with cardioversion last month, she has since reverted back to atrial fibrillation.  No strong indication to cardiovert this patient.  Hyponatremia: looking back, appears to have chronic hyponatremia from Apr 2023  Hypertension: Only on 10 mg daily of lisinopril at home.  Initial blood pressure on arrival was 201/90, however blood pressure improved down to 130s without intervention.  Rectal carcinoma s/p colectomy: She has a colostomy bag.  No recent changes according to family.  History of GI bleed secondary to AVM: Underwent argon plasma coagulation on 06/11/2022.  Anemia resolved.   Risk Assessment/Risk Scores:      CHA2DS2-VASc Score = 5   This indicates a 7.2% annual risk of stroke. The patient's score is based upon: CHF History: 0 HTN History: 1 Diabetes History: 0 Stroke History: 0 Vascular Disease History: 1 Age Score: 2 Gender Score: 1    For questions or updates, please contact [facility] Please consult [email or web address] for contact info under    Signed, Azalee Course, PA  07/12/2022 3:25 PM As above, patient seen and examined.  Briefly she is an 89 year old female with past medical history of persistent atrial fibrillation, hypertension, hyperlipidemia, history of rectal carcinoma status post colectomy, ulcerative colitis, history of GI bleed for evaluation of acute diastolic congestive heart failure.  Patient diagnosed with new onset atrial fibrillation in April 2024.  She was treated with Eliquis and underwent successful cardioversion in May.  Patient states that she has had fatigue and her symptoms did not improve following cardioversion.  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  She also has had difficulties with bradycardia.  She was seen by Dr. Nelly Laurence and pacemaker not felt indicated at this point.  Monitor showed atrial  fibrillation with heart rate between 39 and 93 with average 65.  She was readmitted with complaints of decreased mental status and functional decline.  She has complained of fatigue but denies dyspnea or chest pain.  She has also described new onset bilateral lower extremity edema.  Cardiology now asked to evaluate.  Sodium 126, potassium 3.9, BUN 15, creatinine 0.61.  Electrocardiogram shows atrial fibrillation, right axis deviation, RV conduction delay.  Hemoglobin 11.3, albumin 3.5, AST 62, ALT 51.  TSH 1.193.  BNP 664.  Head CT without acute abnormalities.  Chest x-ray shows mild pulmonary edema and bilateral pleural effusions.  1 acute diastolic congestive heart failure-patient is volume overloaded on exam.  Recent echocardiogram showed normal LV function and follow-up study is pending.  Will plan to diurese with Lasix 40 mg IV twice daily.  Follow renal function.  Note albumin is 3.5.  Will check urinalysis for proteinuria though this was unrevealing in May.  2 persistent atrial fibrillation-patient underwent recent cardioversion but atrial fibrillation recurred.  Her symptoms did not improve following cardioversion.  She was seen by Dr. Nelly Laurence and thought at present was to continue with rate control/anticoagulation.  She is requiring no AV nodal blocking agents.  He also felt that if rhythm control pursued she would require pacemaker.  Will continue apixaban (decrease to 2.5 mg twice daily).  3 declining mental status-I do not think this is related to atrial fibrillation or CHF.  Further evaluation per primary service.  4 hyponatremia-follow sodium closely with diuresis.  5 hypertension-continue present medications and follow blood pressure.  Olga Millers, MD

## 2022-07-12 NOTE — Progress Notes (Signed)
Patient HR is 40-50 with frequent non-sustained drops into the mid 30s

## 2022-07-12 NOTE — NC FL2 (Signed)
[place] MEDICAID FL2 LEVEL OF CARE FORM     IDENTIFICATION  Patient Name: Sandy Trujillo Birthdate: Dec 01, 1932 Sex: female Admission Date (Current Location): 07/11/2022  [facility] and [place] Number:  Producer, television/film/video and Address:  [facility] [place], [address] [place], [place] [address]      Provider Number: [id number]  Attending Physician Name and Address:  Lanae Boast, MD  Relative Name and Phone Number:  Marylene Land (daughter) [phone]    Current Level of Care: Hospital Recommended Level of Care: Skilled Nursing Facility Prior Approval Number:    Date Approved/Denied:   PASRR Number: PASRR under review  Discharge Plan: SNF    Current Diagnoses: Patient Active Problem List   Diagnosis Date Noted   Acute metabolic encephalopathy 07/11/2022   Acute on chronic combined systolic and diastolic CHF (congestive heart failure) (HCC) 07/11/2022   Hypoglycemia 07/11/2022   Hypothermia 07/11/2022   Melena 06/11/2022   Acute blood loss anemia 06/11/2022   AVM (arteriovenous malformation) of small bowel, acquired with hemorrhage 06/11/2022   Symptomatic anemia 06/10/2022   GIB (gastrointestinal bleeding) 06/10/2022   Hyponatremia 06/10/2022   Hypocalcemia 06/10/2022   AV block, 2nd degree 06/09/2022   Persistent atrial fibrillation (HCC) 05/25/2022   Hypercoagulable state due to persistent atrial fibrillation (HCC) 05/25/2022   PAF (paroxysmal atrial fibrillation) (HCC) 05/09/2022   Pleural effusion on right 05/09/2022   Ileostomy in place [facility]) 05/09/2022   Anemia 05/09/2022   HYPERLIPIDEMIA 04/18/2007   Allergic rhinitis 04/18/2007   PULMONARY NODULE 04/18/2007   CARCINOMA, RECTUM 04/17/2007   ANXIETY 04/17/2007   Essential hypertension 04/17/2007   COLITIS, ULCERATIVE 04/17/2007    Orientation RESPIRATION BLADDER Height & Weight     Self, Time, Situation, Place  O2 (Nasal Cannula 1 liter) Continent Weight: 131 lb 13.4 oz (59.8  kg) Height:  5' (152.4 cm)  BEHAVIORAL SYMPTOMS/MOOD NEUROLOGICAL BOWEL NUTRITION STATUS      Continent Diet (Please see discharge summary)  AMBULATORY STATUS COMMUNICATION OF NEEDS Skin   Extensive Assist Verbally Other (Comment) (Appropriate for ethnicity, Rash,Abdomen,Bilateral,Wound/Incision LDAs)                       Personal Care Assistance Level of Assistance  Bathing, Dressing, Feeding Bathing Assistance: Maximum assistance Feeding assistance: Limited assistance Dressing Assistance: Maximum assistance     Functional Limitations Info  Sight, Hearing, Speech Sight Info:  (Reading glasses) Hearing Info: Adequate Speech Info: Adequate    SPECIAL CARE FACTORS FREQUENCY  PT (By licensed PT), OT (By licensed OT)     PT Frequency: 5x min weekly OT Frequency: 5x min weekly            Contractures Contractures Info: Not present    Additional Factors Info  Code Status, Allergies Code Status Info: FULL Allergies Info: Actonel (risedronate),Buspar (buspirone),Sertraline           Current Medications (07/12/2022):  This is the current hospital active medication list Current Facility-Administered Medications  Medication Dose Route Frequency Provider Last Rate Last Admin   0.9 %  sodium chloride infusion  250 mL Intravenous PRN Rometta Emery, MD       acetaminophen (TYLENOL) tablet 650 mg  650 mg Oral Q4H PRN Rometta Emery, MD       ALPRAZolam Prudy Feeler) tablet 0.25 mg  0.25 mg Oral BID PRN Rometta Emery, MD       apixaban (ELIQUIS) tablet 5 mg  5 mg Oral BID  Rometta Emery, MD   5 mg at 07/12/22 0816   dextrose (GLUTOSE) oral gel 40%  1 Tube Oral STAT Garba, Mohammad L, MD       dextrose 50 % solution 50 mL  1 ampule Intravenous PRN Dow Adolph N, DO   25 mL at 07/12/22 0440   feeding supplement (ENSURE ENLIVE / ENSURE PLUS) liquid 237 mL  237 mL Oral BID BM Kc, Ramesh, MD   237 mL at 07/12/22 0814   furosemide (LASIX) injection 40 mg  40 mg Intravenous  BID Earlie Lou L, MD   40 mg at 07/12/22 0816   lisinopril (ZESTRIL) tablet 10 mg  10 mg Oral Daily Rometta Emery, MD   10 mg at 07/12/22 0816   nystatin (MYCOSTATIN/NYSTOP) topical powder   Topical TID Darlin Drop, DO   Given at 07/12/22 1031   ondansetron (ZOFRAN) injection 4 mg  4 mg Intravenous Q6H PRN Rometta Emery, MD       pantoprazole (PROTONIX) EC tablet 40 mg  40 mg Oral Daily Earlie Lou L, MD   40 mg at 07/12/22 0816   sodium chloride flush (NS) 0.9 % injection 3 mL  3 mL Intravenous Q12H Earlie Lou L, MD   3 mL at 07/12/22 0827   sodium chloride flush (NS) 0.9 % injection 3 mL  3 mL Intravenous PRN Rometta Emery, MD         Discharge Medications: Please see discharge summary for a list of discharge medications.  Relevant Imaging Results:  Relevant Lab Results:   Additional Information [id number]  Delilah Shan, LCSWA

## 2022-07-12 NOTE — Progress Notes (Signed)
Her ostomy bag is typically changed q4days. The last bag change was 07/11/2022.

## 2022-07-13 ENCOUNTER — Inpatient Hospital Stay (HOSPITAL_COMMUNITY): Payer: Medicare Other

## 2022-07-13 DIAGNOSIS — I3139 Other pericardial effusion (noninflammatory): Secondary | ICD-10-CM | POA: Diagnosis not present

## 2022-07-13 DIAGNOSIS — T68XXXA Hypothermia, initial encounter: Secondary | ICD-10-CM

## 2022-07-13 DIAGNOSIS — E162 Hypoglycemia, unspecified: Secondary | ICD-10-CM

## 2022-07-13 DIAGNOSIS — Z7189 Other specified counseling: Secondary | ICD-10-CM

## 2022-07-13 DIAGNOSIS — G9341 Metabolic encephalopathy: Secondary | ICD-10-CM | POA: Diagnosis not present

## 2022-07-13 DIAGNOSIS — E871 Hypo-osmolality and hyponatremia: Secondary | ICD-10-CM

## 2022-07-13 DIAGNOSIS — I5043 Acute on chronic combined systolic (congestive) and diastolic (congestive) heart failure: Secondary | ICD-10-CM

## 2022-07-13 DIAGNOSIS — I480 Paroxysmal atrial fibrillation: Secondary | ICD-10-CM

## 2022-07-13 LAB — GLUCOSE, CAPILLARY
Glucose-Capillary: 58 mg/dL — ABNORMAL LOW (ref 70–99)
Glucose-Capillary: 88 mg/dL (ref 70–99)
Glucose-Capillary: 94 mg/dL (ref 70–99)
Glucose-Capillary: 136 mg/dL — ABNORMAL HIGH (ref 70–99)
Glucose-Capillary: 158 mg/dL — ABNORMAL HIGH (ref 70–99)

## 2022-07-13 LAB — BASIC METABOLIC PANEL
Anion gap: 11 (ref 5–15)
BUN: 10 mg/dL (ref 8–23)
CO2: 40 mmol/L — ABNORMAL HIGH (ref 22–32)
Calcium: 8.6 mg/dL — ABNORMAL LOW (ref 8.9–10.3)
Chloride: 78 mmol/L — ABNORMAL LOW (ref 98–111)
Creatinine, Ser: 0.83 mg/dL (ref 0.44–1.00)
GFR, Estimated: >60 mL/min (ref >60)
Glucose, Bld: 61 mg/dL — ABNORMAL LOW (ref 70–99)
Potassium: 3.1 mmol/L — ABNORMAL LOW (ref 3.5–5.1)
Sodium: 129 mmol/L — ABNORMAL LOW (ref 135–145)

## 2022-07-13 LAB — CORTISOL-AM, BLOOD: Cortisol - AM: 27.8 ug/dL — ABNORMAL HIGH (ref 6.7–22.6)

## 2022-07-13 MED ORDER — POTASSIUM CHLORIDE CRYS ER 20 MEQ PO TBCR
40.0000 meq | EXTENDED_RELEASE_TABLET | ORAL | Status: AC
  Administered 2022-07-13 (×2): 40 meq via ORAL
  Filled 2022-07-13 (×2): qty 2

## 2022-07-13 MED ORDER — ADULT MULTIVITAMIN W/MINERALS CH
1.0000 | ORAL_TABLET | Freq: Every day | ORAL | Status: DC
  Administered 2022-07-13 – 2022-07-17 (×5): 1 via ORAL
  Filled 2022-07-13 (×5): qty 1

## 2022-07-13 MED ORDER — ACETAZOLAMIDE 250 MG PO TABS
250.0000 mg | ORAL_TABLET | Freq: Two times a day (BID) | ORAL | Status: DC
  Administered 2022-07-13 – 2022-07-16 (×6): 250 mg via ORAL
  Filled 2022-07-13 (×7): qty 1

## 2022-07-13 MED ORDER — POTASSIUM CHLORIDE CRYS ER 20 MEQ PO TBCR
20.0000 meq | EXTENDED_RELEASE_TABLET | Freq: Every day | ORAL | Status: DC
  Administered 2022-07-14: 20 meq via ORAL
  Filled 2022-07-13: qty 1

## 2022-07-13 NOTE — Progress Notes (Signed)
PROGRESS NOTE EDELLE SCHLEY  WGN:[id number] DOB: [birth year] DOA: 07/11/2022 PCP: Thana Ates, MD  Brief Narrative/Hospital Course: 89 year old female living independently, history of rectal cancer with ileostomy, persistent atrial fibrillation on Eliquis recent failed cardioversion on 5/13.24, GERD, hypertension, osteoporosis hyperparathyroidism, anxiety disorder, AVM of small bowel  who is having intermittent altered mental status and needing family with her to assist with her ileostomy bag care , presented to the ED with altered mental status, bilateral leg swelling In the ED initially hypertensive 201/90, temp 95, hr 50-70s, Labs showed hyponatremia 123 hypoglycemia 62, BNP 664, CBC with anemia 11.3 g, TSH 1.193 Chest x-ray with mild pulmonary edema small bilateral pleural effusion and bibasilar atelectasis CT head no acute finding chronic microvascular ischemic changes and age-related atrophy. Patient was given D50 IV, insulin Lasix and admitted for further management. Given significant leg edema concerns for CHF exacerbation -Cardiology was consulted 6/17 TTE> EF 40-45%, with regional wall motion abnormalities moderately reduced RV systolic function severely elevated pulmonary artery systolic pressure with RV systolic pressure of 62.1, severely dilated left atrium, right atrium, moderate to large pericardial effusion concern for early tamponade physiology, large pleural effusion in the left lateral region.   Subjective: Seen and examined this morning Son at the bedside at this patient alertness is better but not completely normal Patient has no complaints Earlier this morning again hypoglycemic at 58 given D50  Past 24 hours temperature on lower side 95.5- 97.5 BP stable, heart rate is stable Labs showing sodium trending up potassium low 3.1 W/ DIURESIS.  Assessment and Plan: Principal Problem:   Acute metabolic encephalopathy Active Problems:   PAF (paroxysmal atrial  fibrillation) (HCC)   ANXIETY   Essential hypertension   CARCINOMA, RECTUM   Hyponatremia   Acute on chronic combined systolic and diastolic CHF (congestive heart failure) (HCC)   Hypoglycemia   Hypothermia   Leg swelling/ fluid overload New onset systolic CHF EF 40-45% on tte Moderately reduced RV systolic function, severely elevated pulmonary artery systolic pressure Moderate to large pericardial effusion concern for early tamponade Large pleural effusion on the left: Presenting with leg swelling elevated BNP chest x-ray with pulmonary edema pleural effusion with vague history of atrial fibrillation, echo with reduced systolic function severe elevated pulmonary pressure and concern for pericardial effusion. BP/hemodynamics stable, cardiology following closely-discussed this morning planning to get CT chest noncontrast to evaluate pericardial effusion and pleural effusion question if needed thoracentesis, will need left right heart catheter later this week to rule out tamponade physiology and repeat echocardiogram in 2 to 3 days.  Continue on IV Lasix,Cont to monitor daily I/O,weight, electrolytes and net balance as below.Keep on  salt/fluid restricted diet and monitor in tele. Net IO Since Admission: -3,450 mL [07/13/22 0951]  Filed Weights   07/11/22 2226 07/12/22 0500 07/13/22 0408  Weight: 59 kg 59.8 kg 57.3 kg   Recent Labs  Lab 07/11/22 1501 07/11/22 1503 07/12/22 0217 07/13/22 0233  BNP 664.9*  --   --   --   BUN  --  14 15 10   CREATININE  --  0.70 0.61 0.83  K  --  4.0 3.9 3.1*   Persistent atrial fibrillation recentcardioversion 06/23/2021 but failed, holding eliquis in case she needs procedure per cardiology, rate is well controlled.  Acute metabolic encephalopathy: Patient was slow to response.CT head age-related changes.  TSH stable does have hyponatremia.  Temperature on lower side, but was hypoglycemic which could explain .  Likely multifactorial encephalopathy  overall improved  and per family patient close to baseline 6/17,UA WAS Normal at [facility] per daughter 6/16, denied urinary symptoms.  Hypoglycemia: Not on OHA and no known history of diabetes.did not eat 6/16-but again hypoglycemic this morning-son endorses he barely ate anything likely the cause of her hypoglycemia, checked serum cortisol and reassuring at 27, Continue to manage with hypoglycemia protocol, q4cbg check. Recent Labs  Lab 07/12/22 1637 07/12/22 2056 07/12/22 2354 07/13/22 0420 07/13/22 0804  GLUCAP 70 94 112* 58* 88    Hypervolemia hyponatremia: Sodium improving on iv Lasix.  Does have current hyponatremia in 126- 131 in may 2024-check serum cortisol Recent Labs  Lab 07/11/22 1503 07/12/22 0217 07/13/22 0233  NA 123* 126* 129*    Hypokalemia will replete while on Lasix, monitor. History of rectal cancer ileostomy in place-able ot manage self at home.  Goals of care/CODE STATUS: Discussed with son at bedside currently full code.  Given advanced age multiple comorbidities palliative care consulted to discuss further, son agreeable  DVT prophylaxis: Eliquis Code Status:   Code Status: Full Code Family Communication: plan of care discussed with patient/ son at bedside. Patient status is:  inpatient because of due to anasarca Level of care: Telemetry Cardiac   Dispo: The patient is from: Home lives alone             Anticipated disposition: Tbd. PTOT eval Objective: Vitals last 24 hrs: Vitals:   07/12/22 2007 07/12/22 2351 07/13/22 0408 07/13/22 0806  BP: (!) 152/74 (!) 150/62  (!) 157/75  Pulse: 64 89  64  Resp: 19 15  18   Temp: (!) 97.5 F (36.4 C) (!) 97.4 F (36.3 C) (!) 97.5 F (36.4 C) 97.6 F (36.4 C)  TempSrc: Oral Oral Oral Oral  SpO2: 97% 95%  97%  Weight:   57.3 kg   Height:       Weight change: -2.7 kg  Physical Examination: General exam: AA,appears weak and older than stated HEENT:Oral mucosa moist, Ear/Nose WNL grossly, dentition  normal. Respiratory system: bilaterally clear BS, no use of accessory muscle Cardiovascular system: S1 & S2 +, irregular rate. Gastrointestinal system: Abdomen soft, NT,ND,BS+ Nervous System:Alert, awake, moving extremities and grossly nonfocal Extremities: LE ankle edema +++, lower extremities warm Skin: No rashes,no icterus. MSK: small/weak muscle bulk,tone, power   Medications reviewed:  Scheduled Meds:  acetaZOLAMIDE  250 mg Oral BID   feeding supplement  237 mL Oral BID BM   furosemide  40 mg Intravenous BID   lisinopril  10 mg Oral Daily   loratadine  10 mg Oral Daily   nystatin   Topical TID   pantoprazole  40 mg Oral Daily   [START ON 07/14/2022] potassium chloride  20 mEq Oral Daily   potassium chloride  40 mEq Oral Q3H   sodium chloride flush  3 mL Intravenous Q12H   Continuous Infusions:  sodium chloride        Diet Order             Diet Heart Room service appropriate? Yes; Fluid consistency: Thin  Diet effective now                   Intake/Output Summary (Last 24 hours) at 07/13/2022 0951 Last data filed at 07/13/2022 0900 Gross per 24 hour  Intake --  Output 3225 ml  Net -3225 ml   Net IO Since Admission: -3,450 mL [07/13/22 0951]  Wt Readings from Last 3 Encounters:  07/13/22 57.3 kg  06/28/22 59.6 kg  06/22/22 58.6 kg     Unresulted Labs (From admission, onward)     Start     Ordered   07/12/22 1540  Urinalysis, Routine w reflex microscopic -Urine, Catheterized  Once,   R       Question:  Specimen Source  Answer:  Urine, Catheterized   07/12/22 1540   07/12/22 0500  Basic metabolic panel  Daily,   R     Comments: As Scheduled for 5 days    07/11/22 2353          Data Reviewed: I have personally reviewed following labs and imaging studies CBC: Recent Labs  Lab 07/11/22 1503  WBC 5.1  HGB 11.3*  HCT 36.0  MCV 87.2  PLT 174   Basic Metabolic Panel: Recent Labs  Lab 07/11/22 1503 07/12/22 0217 07/13/22 0233  NA 123* 126*  129*  K 4.0 3.9 3.1*  CL 80* 83* 78*  CO2 31 34* 40*  GLUCOSE 62* 83 61*  BUN 14 15 10   CREATININE 0.70 0.61 0.83  CALCIUM 9.0 8.6* 8.6*  ZOX:[id number] Creatinine Clearance: 37.8 mL/min (by C-G formula based on SCr of 0.83 mg/dL). Liver Function Tests: Recent Labs  Lab 07/11/22 1503  AST 62*  ALT 51*  ALKPHOS 76  BILITOT 1.4*  PROT 6.7  ALBUMIN 3.5   Recent Labs  Lab 07/12/22 1637 07/12/22 2056 07/12/22 2354 07/13/22 0420 07/13/22 0804  GLUCAP 70 94 112* 58* 88   Recent Labs    07/11/22 1501  TSH 1.193   Sepsis Labs: Recent Labs  Lab 07/11/22 1633 07/11/22 2209  LATICACIDVEN 0.9 0.9    No results found for this or any previous visit (from the past 240 hour(s)).  Antimicrobials: Anti-infectives (From admission, onward)    None      Culture/Microbiology    Component Value Date/Time   SDES URINE, CLEAN CATCH 06/10/2022 1024   SPECREQUEST NONE 06/10/2022 1024   CULT (A) 06/10/2022 1024    <10,000 COLONIES/mL INSIGNIFICANT GROWTH Performed at [facility], [address], [place], [place] [address]    REPTSTATUS 06/11/2022 FINAL 06/10/2022 1024   Radiology Studies: ECHOCARDIOGRAM COMPLETE  Result Date: 07/12/2022    ECHOCARDIOGRAM REPORT   Patient Name:   MASIE CHIARAMONTE Date of Exam: 07/12/2022 Medical Rec #:  [id number]         Height:       60.0 in Accession #:    [id number]        Weight:       131.8 lb Date of Birth:  02-06-1932        BSA:          1.564 m Patient Age:    89 years          BP:           135/57 mmHg Patient Gender: F                 HR:           64 bpm. Exam Location:  Inpatient Procedure: 2D Echo, Cardiac Doppler and Color Doppler Indications:    CHF - Acute Diastolic  History:        Patient has prior history of Echocardiogram examinations, most                 recent 05/10/2022. CHF, cancer, Arrythmias:Atrial Fibrillation                 and 2nd degree AV  block, Signs/Symptoms:Edema and Shortness of                 Breath;  Risk Factors:Hypertension and Dyslipidemia.  Sonographer:    Wallie Char Referring Phys: [id number] MOHAMMAD L GARBA IMPRESSIONS  1. Left ventricular ejection fraction, by estimation, is 40 to 45%. The left ventricle has mildly decreased function. The left ventricle demonstrates regional wall motion abnormalities (see scoring diagram/findings for description). Left ventricular diastolic parameters are indeterminate.  2. Right ventricular systolic function is moderately reduced. The right ventricular size is mildly enlarged. There is severely elevated pulmonary artery systolic pressure. The estimated right ventricular systolic pressure is 62.1 mmHg.  3. Left atrial size was severely dilated.  4. Right atrial size was severely dilated.  5. There is a moderate to large pericardial effusion adjacent to the RV and RA on the subcostal images with mild diastolic collpase of the RA. The IVC is markedly dialted. Concern for early tamponade physiology. Moderate pericardial effusion. The pericardial effusion is posterior to the left ventricle, localized near the right atrium and anterior to the right ventricle. Large pleural effusion in the left lateral region.  6. The mitral valve is normal in structure. Moderate mitral valve regurgitation. No evidence of mitral stenosis.  7. Tricuspid valve regurgitation is moderate to severe.  8. The aortic valve is tricuspid. There is mild calcification of the aortic valve. Aortic valve regurgitation is not visualized. Aortic valve sclerosis/calcification is present, without any evidence of aortic stenosis.  9. The inferior vena cava is normal in size with greater than 50% respiratory variability, suggesting right atrial pressure of 3 mmHg. Conclusion(s)/Recommendation(s): There is a moderate to large pericardial effusion adjacent to the RV and RA on the subcostal images with mild diastolic collpase of the RA. The IVC is markedly dialted. Concern for early tamponade physiology. FINDINGS  Left  Ventricle: Left ventricular ejection fraction, by estimation, is 40 to 45%. The left ventricle has mildly decreased function. The left ventricle demonstrates regional wall motion abnormalities. The left ventricular internal cavity size was normal in size. There is no left ventricular hypertrophy. Left ventricular diastolic parameters are indeterminate.  LV Wall Scoring: The entire septum and posterior wall are hypokinetic. Right Ventricle: The right ventricular size is mildly enlarged. No increase in right ventricular wall thickness. Right ventricular systolic function is moderately reduced. There is severely elevated pulmonary artery systolic pressure. The tricuspid regurgitant velocity is 3.43 m/s, and with an assumed right atrial pressure of 15 mmHg, the estimated right ventricular systolic pressure is 62.1 mmHg. Left Atrium: Left atrial size was severely dilated. Right Atrium: Right atrial size was severely dilated. Pericardium: There is a moderate to large pericardial effusion adjacent to the RV and RA on the subcostal images with mild diastolic collpase of the RA. The IVC is markedly dialted. Concern for early tamponade physiology. A moderately sized pericardial effusion is present. The pericardial effusion is posterior to the left ventricle, localized near the right atrium and anterior to the right ventricle. Mitral Valve: The mitral valve is normal in structure. Moderate mitral valve regurgitation, with centrally-directed jet. No evidence of mitral valve stenosis. MV peak gradient, 6.8 mmHg. The mean mitral valve gradient is 2.0 mmHg. Tricuspid Valve: The tricuspid valve is normal in structure. Tricuspid valve regurgitation is moderate to severe. No evidence of tricuspid stenosis. Aortic Valve: The aortic valve is tricuspid. There is mild calcification of the aortic valve. Aortic valve regurgitation is not visualized. Aortic valve sclerosis/calcification is present, without any evidence of  aortic stenosis.  Aortic valve mean gradient measures 5.0 mmHg. Aortic valve peak gradient measures 10.2 mmHg. Aortic valve area, by VTI measures 1.66 cm. Pulmonic Valve: The pulmonic valve was normal in structure. Pulmonic valve regurgitation is trivial. No evidence of pulmonic stenosis. Aorta: The aortic root is normal in size and structure. Venous: The inferior vena cava is normal in size with greater than 50% respiratory variability, suggesting right atrial pressure of 3 mmHg. IAS/Shunts: No atrial level shunt detected by color flow Doppler. Additional Comments: There is a large pleural effusion in the left lateral region.  LEFT VENTRICLE PLAX 2D LVIDd:         4.80 cm     Diastology LVIDs:         3.70 cm     LV e' medial:    4.72 cm/s LV PW:         0.80 cm     LV E/e' medial:  26.3 LV IVS:        0.70 cm     LV e' lateral:   6.39 cm/s LVOT diam:     1.70 cm     LV E/e' lateral: 19.4 LV SV:         63 LV SV Index:   40 LVOT Area:     2.27 cm  LV Volumes (MOD) LV vol d, MOD A2C: 78.4 ml LV vol d, MOD A4C: 69.5 ml LV vol s, MOD A2C: 36.8 ml LV vol s, MOD A4C: 33.4 ml LV SV MOD A2C:     41.6 ml LV SV MOD A4C:     69.5 ml LV SV MOD BP:      38.4 ml RIGHT VENTRICLE            IVC RV Basal diam:  4.20 cm    IVC diam: 2.00 cm RV S prime:     9.05 cm/s TAPSE (M-mode): 1.5 cm LEFT ATRIUM             Index        RIGHT ATRIUM           Index LA diam:        5.10 cm 3.26 cm/m   RA Area:     28.70 cm LA Vol (A2C):   78.1 ml 49.95 ml/m  RA Volume:   117.00 ml 74.83 ml/m LA Vol (A4C):   64.3 ml 41.12 ml/m LA Biplane Vol: 72.1 ml 46.11 ml/m  AORTIC VALVE AV Area (Vmax):    1.70 cm AV Area (Vmean):   1.64 cm AV Area (VTI):     1.66 cm AV Vmax:           159.50 cm/s AV Vmean:          106.500 cm/s AV VTI:            0.376 m AV Peak Grad:      10.2 mmHg AV Mean Grad:      5.0 mmHg LVOT Vmax:         119.33 cm/s LVOT Vmean:        76.833 cm/s LVOT VTI:          0.276 m LVOT/AV VTI ratio: 0.73  AORTA Ao Root diam: 3.00 cm Ao Asc diam:   3.20 cm MITRAL VALVE                  TRICUSPID VALVE MV Area (PHT): 4.86 cm       TR Peak grad:  47.1 mmHg MV Area VTI:   1.96 cm       TR Mean grad:   30.0 mmHg MV Peak grad:  6.8 mmHg       TR Vmax:        343.00 cm/s MV Mean grad:  2.0 mmHg       TR Vmean:       259.0 cm/s MV Vmax:       1.30 m/s MV Vmean:      59.0 cm/s      SHUNTS MV Decel Time: 156 msec       Systemic VTI:  0.28 m MR Peak grad:    151.8 mmHg   Systemic Diam: 1.70 cm MR Mean grad:    104.0 mmHg MR Vmax:         616.00 cm/s MR Vmean:        489.0 cm/s MR PISA:         2.26 cm MR PISA Eff ROA: 11 mm MR PISA Radius:  0.60 cm MV E velocity: 124.00 cm/s MV A velocity: 52.67 cm/s MV E/A ratio:  2.35 Arvilla Meres MD Electronically signed by Arvilla Meres MD Signature Date/Time: 07/12/2022/5:06:43 PM    Final    CT Head Wo Contrast  Result Date: 07/11/2022 CLINICAL DATA:  Altered mental status. EXAM: CT HEAD WITHOUT CONTRAST TECHNIQUE: Contiguous axial images were obtained from the base of the skull through the vertex without intravenous contrast. RADIATION DOSE REDUCTION: This exam was performed according to the departmental dose-optimization program which includes automated exposure control, adjustment of the mA and/or kV according to patient size and/or use of iterative reconstruction technique. COMPARISON:  None Available. FINDINGS: Brain: Mild age-related atrophy and chronic microvascular ischemic changes. There is no acute intracranial hemorrhage. No mass effect or midline shift. No extra-axial fluid collection. Vascular: No hyperdense vessel or unexpected calcification. Skull: Normal. Negative for fracture or focal lesion. Sinuses/Orbits: No acute finding. Other: None IMPRESSION: 1. No acute intracranial pathology. 2. Mild age-related atrophy and chronic microvascular ischemic changes. Electronically Signed   By: Elgie Collard M.D.   On: 07/11/2022 19:01   DG Chest 2 View  Result Date: 07/11/2022 CLINICAL DATA:  SOB EXAM:  CHEST - 2 VIEW COMPARISON:  June 30, 2022 FINDINGS: The cardiomediastinal silhouette is unchanged in contour. Small bilateral pleural effusions, mildly increased. No pneumothorax. Mildly increased bibasilar homogeneous platelike opacities. Mildly increased interstitial markings diffusely. Visualized abdomen is unremarkable. Mild multilevel degenerative changes of the thoracic spine. IMPRESSION: Constellation of findings are favored to reflect mild pulmonary edema with increased small bilateral pleural effusions and bibasilar atelectasis. Electronically Signed   By: Meda Klinefelter M.D.   On: 07/11/2022 15:47     LOS: 2 days   Lanae Boast, MD [facility]  07/13/2022, 9:51 AM

## 2022-07-13 NOTE — Consult Note (Signed)
Consultation Note Date: 07/13/2022   Patient Name: Sandy Trujillo  DOB: June 30, 1932  MRN: [id number]  Age / Sex: 89 y.o., female  PCP: Thana Ates, MD Referring Physician: Lanae Boast, MD  Reason for Consultation: Establishing goals of care  HPI/Patient Profile: 89 y.o. female  with past medical history of GERD, hyperparathyroidism, essential hypertension, HLD, history of ileostomy, osteoporosis, history of rectal cancer, history of anemia and atrial fibrillation, ulcerative colitis, history of GI bleed admitted on 07/11/2022 with altered mental status.   Patient was diagnosed with new onset atrial fibrillation in April 2024. She was treated with Eliquis and underwent successful cardioversion in May. Patient states that she has had fatigue and her symptoms did not improve following cardioversion. 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 She also has had difficulties with bradycardia and new onset systolic CHF.   Echocardiogram this admission shows ejection fraction 40 to 45% which is decreased compared to previous, moderate RV dysfunction, mild right ventricular enlargement, severe pulmonary hypertension, severe biatrial enlargement, moderate to large pericardial effusion with mild diastolic collapse of the right atrium, large left pleural effusion; concern for early tamponade.   PMT has been consulted to assist with goals of care conversation.  Clinical Assessment and Goals of Care:  I have reviewed medical records including EPIC notes, labs and imaging, assessed the patient and then met at the bedside with patient's daughter Marylene Land and [name] to discuss diagnosis prognosis, GOC, EOL wishes, disposition and options. Marylene Land had to quickly step out after initial review of palliative care role on patient's care team.  I introduced Palliative  Medicine as specialized medical care for people living with serious illness. It focuses on providing relief from the symptoms and stress of a serious illness. The goal is to improve quality of life for both the patient and the family.  We discussed a brief life review of the patient and then focused on their current illness.   I attempted to elicit values and goals of care important to the patient.    Medical History Review and Understanding:  We discussed patient's chronic comorbidities, which began affecting her around 20 years ago, as well as her acute illness which has been worsening over the past couple of months. Discussed patient's high risk for further decompensation and decline.  Social History: Patient has 3 daughters and 1 son. She has been widowed for over twenty years. She has remained independent throughout her life until recently. She previously enjoyed sewing, though it has been a long time since she has been able to. 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    Functional and Nutritional State: Patient has been rapidly declining over the past couple of months. Her cognition is improving and she also feels she is getting a little bit stronger.   Palliative Symptoms: Edema, weakness  Advance Directives: A detailed discussion regarding advanced directives was had. Patient has completed HCPOA/Living will documentation at home and her daughter [name] will kindly bring them in when she has the opportunity.   Code Status: Concepts related to rehospitalization, artificial nutrition and hydration, mechanical ventilation, and CPR were considered/discussed. Patient confirms her current desire for full code/full scope treatment. I shared my concern for the potential that this causes patient greater harm than benefit with little to no likelihood that a resuscitation effort would be successful..  Discussion: Patient is sitting in bedside  chair and  attentive during our conversation, though she has trouble following some of the topics discussed. Her goal is to improve and get stronger. Family is interested in rehab to assist with this if possible. Patient initially states that she would not want to escalate care to the ICU, then states she would want to try anything to keep her alive. We discussed the typical recovery from a critical illness and life support, gently emphasizing that many patients who do recover from such illnesses live with much worse quality of life than before. This particularly occurs in patients who greatly valued their independence and would not want to be dependent on others for a prolonged period. We discussed patient's worsening heart failure and overall tenuous condition. Encouraged patient and her family to have open and honest conversations, planning for the worst even while hoping for the best. They are appreciative of the guidance in navigating patient's anticipatory care planning needs. A MOST form was introduced and patient was encouraged to complete with family support prior to discharge to rehab. I also spoke separately with patient's daughter about her prognosis and potential need for LTC placement if she does not improve enough with short-term rehab. Emotional support and therapeutic listening was provided. She understands this, as difficult as it is, and has been seeing some worrying signs for a while now.    The difference between aggressive medical intervention and comfort care was considered in light of the patient's goals of care. Hospice and Palliative Care services outpatient were explained and offered.   Discussed the importance of continued conversation with family and the medical providers regarding overall plan of care and treatment options, ensuring decisions are within the context of the patient's values and GOCs.   Questions and concerns were addressed.  Hard Choices booklet left for review. The family  was encouraged to call with questions or concerns.  PMT will continue to support holistically.   SUMMARY OF RECOMMENDATIONS   -Continue full code/full scope treatment for now, ongoing GOC discussions -Patient and family are hopeful for improvement, though patient seems to have less understanding of the severity of her overall illness -Psychosocial and emotional support provided -PMT will continue to follow and support   Prognosis:  Unable to determine  Discharge Planning: To Be Determined      Primary Diagnoses: Present on Admission:  Acute metabolic encephalopathy  PAF (paroxysmal atrial fibrillation) (HCC)  ANXIETY  Essential hypertension  CARCINOMA, RECTUM  Hyponatremia  Acute on chronic combined systolic and diastolic CHF (congestive heart failure) (HCC)  Hypoglycemia  Hypothermia   Physical Exam Vitals and nursing note reviewed.  Constitutional:      General: She is not in acute distress.    Appearance: She is ill-appearing.  Cardiovascular:     Rate and Rhythm: Bradycardia present.  Pulmonary:     Effort: Pulmonary effort is normal. No respiratory distress.  Neurological:     Mental Status: She is alert.  Psychiatric:        Mood and Affect: Mood normal.        Behavior: Behavior normal.     Vital Signs: BP 138/73 (BP Location: Left Arm)   Pulse 67   Temp (!) 97.4 F (36.3 C) (Oral)   Resp 17   Ht 5' (1.524 m)   Wt 57.3 kg   SpO2 95%   BMI 24.67 kg/m  Pain Scale: 0-10   Pain Score: 0-No pain   SpO2: SpO2: 95 % O2 Device:SpO2: 95 % O2 Flow  Rate: .O2 Flow Rate (L/min): 1 L/min   Palliative Assessment/Data: 40% at best     MDM: high    Addalie Calles Jeni Salles, PA-C  Palliative Medicine Team Team phone # [phone]  Thank you for allowing the Palliative Medicine Team to assist in the care of this patient. Please utilize secure chat with additional questions, if there is no response within 30 minutes please call the above phone  number.  Palliative Medicine Team providers are available by phone from 7am to 7pm daily and can be reached through the team cell phone.  Should this patient require assistance outside of these hours, please call the patient's attending physician.

## 2022-07-13 NOTE — Progress Notes (Signed)
Physical Therapy Treatment Patient Details Name: Sandy Trujillo MRN: [id number] DOB: October 08, 1932 Today's Date: 07/13/2022   History of Present Illness Pt is an 89 y/o female admitted secondary to AMS and bilateral LE edema. Pt found to have acute onset CHF and metabolic encephalopathy. PMH including but not limited to GERD, hyperparathyroidism, essential hypertension, history of ileostomy, osteoporosis, history of rectal cancer, history of anemia and atrial fibrillation on treatment.    PT Comments    Pt making steady progress with mobility. Pt with some anxiety about moving but does well with some encouragement and reassurance. Patient will benefit from continued inpatient follow up therapy, <3 hours/day    Recommendations for follow up therapy are one component of a multi-disciplinary discharge planning process, led by the attending physician.  Recommendations may be updated based on patient status, additional functional criteria and insurance authorization.  Follow Up Recommendations  Can patient physically be transported by private vehicle: No    Assistance Recommended at Discharge Frequent or constant Supervision/Assistance  Patient can return home with the following A lot of help with walking and/or transfers;A lot of help with bathing/dressing/bathroom;Assistance with cooking/housework;Assist for transportation;Help with stairs or ramp for entrance   Equipment Recommendations  Other (comment) (TBD at next venue)    Recommendations for Other Services       Precautions / Restrictions Precautions Precautions: Fall Restrictions Weight Bearing Restrictions: No     Mobility  Bed Mobility               General bed mobility comments: Pt up in recliner    Transfers Overall transfer level: Needs assistance Equipment used: Rolling walker (2 wheels) Transfers: Sit to/from Stand Sit to Stand: Min assist           General transfer comment: assist to power up and  steady    Ambulation/Gait Ambulation/Gait assistance: Min assist, +2 safety/equipment Gait Distance (Feet): 20 Feet (x 2) Assistive device: Rolling walker (2 wheels) Gait Pattern/deviations: Step-through pattern, Decreased step length - right, Decreased step length - left, Shuffle, Trunk flexed, Narrow base of support Gait velocity: decr Gait velocity interpretation: <1.31 ft/sec, indicative of household ambulator   General Gait Details: Assist for balance and support   Stairs             Wheelchair Mobility    Modified Rankin (Stroke Patients Only)       Balance Overall balance assessment: Needs assistance Sitting-balance support: Feet supported Sitting balance-Leahy Scale: Fair     Standing balance support: During functional activity, Bilateral upper extremity supported Standing balance-Leahy Scale: Poor Standing balance comment: walker and min guard for static standing                            Cognition Arousal/Alertness: Awake/alert Behavior During Therapy: Anxious, Flat affect Overall Cognitive Status: Impaired/Different from baseline Area of Impairment: Following commands, Safety/judgement, Awareness, Problem solving                       Following Commands: Follows one step commands with increased time Safety/Judgement: Decreased awareness of deficits Awareness: Intellectual Problem Solving: Slow processing, Decreased initiation, Requires verbal cues          Exercises      General Comments General comments (skin integrity, edema, etc.): SpO2 >90% on RA with amb but after sitting SpO2 down to mid 80's. Replaced O2 at 2L.      Pertinent Vitals/Pain Pain Assessment  Pain Assessment: Faces Faces Pain Scale: Hurts little more Pain Location: shoulders Pain Descriptors / Indicators: Grimacing, Guarding Pain Intervention(s): Limited activity within patient's tolerance, Monitored during session, Repositioned    Home Living                           Prior Function            PT Goals (current goals can now be found in the care plan section) Progress towards PT goals: Progressing toward goals    Frequency    Min 1X/week      PT Plan Current plan remains appropriate    Co-evaluation              AM-PAC PT "6 Clicks" Mobility   Outcome Measure  Help needed turning from your back to your side while in a flat bed without using bedrails?: A Lot Help needed moving from lying on your back to sitting on the side of a flat bed without using bedrails?: A Lot Help needed moving to and from a bed to a chair (including a wheelchair)?: A Little Help needed standing up from a chair using your arms (e.g., wheelchair or bedside chair)?: A Little Help needed to walk in hospital room?: A Little Help needed climbing 3-5 steps with a railing? : Total 6 Click Score: 14    End of Session Equipment Utilized During Treatment: Gait belt Activity Tolerance: Patient limited by fatigue Patient left: in chair;with call bell/phone within reach;with chair alarm set;with family/visitor present Nurse Communication: Mobility status PT Visit Diagnosis: Other abnormalities of gait and mobility (R26.89)     Time: 1423-1450 PT Time Calculation (min) (ACUTE ONLY): 27 min  Charges:  $Gait Training: 23-37 mins                     [facility] PT Acute Rehabilitation Services Office [phone]    Angelina Ok [facility] 07/13/2022, 5:02 PM

## 2022-07-13 NOTE — Progress Notes (Signed)
Rounding Note    Patient Name: Sandy Trujillo Date of Encounter: 07/13/2022  [facility] Cardiologist: Reatha Harps, MD   Subjective   Denies CP or dyspnea  Inpatient Medications    Scheduled Meds:  apixaban  5 mg Oral BID   feeding supplement  237 mL Oral BID BM   furosemide  40 mg Intravenous BID   lisinopril  10 mg Oral Daily   loratadine  10 mg Oral Daily   nystatin   Topical TID   pantoprazole  40 mg Oral Daily   sodium chloride flush  3 mL Intravenous Q12H   Continuous Infusions:  sodium chloride     PRN Meds: sodium chloride, acetaminophen, ALPRAZolam, dextrose, ondansetron (ZOFRAN) IV, sodium chloride flush   Vital Signs    Vitals:   07/12/22 1640 07/12/22 2007 07/12/22 2351 07/13/22 0408  BP: (!) 135/56 (!) 152/74 (!) 150/62   Pulse: 62 64 89   Resp: 20 19 15    Temp: (!) 97.5 F (36.4 C) (!) 97.5 F (36.4 C) (!) 97.4 F (36.3 C) (!) 97.5 F (36.4 C)  TempSrc: Oral Oral Oral Oral  SpO2: 97% 97% 95%   Weight:    57.3 kg  Height:        Intake/Output Summary (Last 24 hours) at 07/13/2022 0743 Last data filed at 07/13/2022 0411 Gross per 24 hour  Intake --  Output 2825 ml  Net -2825 ml      07/13/2022    4:08 AM 07/12/2022    5:00 AM 07/11/2022   10:26 PM  Last 3 Weights  Weight (lbs) 126 lb 5.2 oz 131 lb 13.4 oz 130 lb 1.1 oz  Weight (kg) 57.3 kg 59.8 kg 59 kg      Telemetry    Atrial fibrillation rate controlled - Personally Reviewed  Physical Exam   GEN: No acute distress.   Neck: supple Cardiac: irrregular Respiratory: Diminished breath sounds bases GI: Soft, nontender, non-distended  MS: 1+ edema Neuro:  Nonfocal  Psych: Normal affect   Labs   Chemistry Recent Labs  Lab 07/11/22 1503 07/12/22 0217 07/13/22 0233  NA 123* 126* 129*  K 4.0 3.9 3.1*  CL 80* 83* 78*  CO2 31 34* 40*  GLUCOSE 62* 83 61*  BUN 14 15 10   CREATININE 0.70 0.61 0.83  CALCIUM 9.0 8.6* 8.6*  PROT 6.7  --   --   ALBUMIN 3.5  --    --   AST 62*  --   --   ALT 51*  --   --   ALKPHOS 76  --   --   BILITOT 1.4*  --   --   GFRNONAA >60 >60 >60  ANIONGAP 12 9 11    Hematology Recent Labs  Lab 07/11/22 1503  WBC 5.1  RBC 4.13  HGB 11.3*  HCT 36.0  MCV 87.2  MCH 27.4  MCHC 31.4  RDW 15.9*  PLT 174   Thyroid  Recent Labs  Lab 07/11/22 1501  TSH 1.193    BNP Recent Labs  Lab 07/11/22 1501  BNP 664.9*     Radiology    ECHOCARDIOGRAM COMPLETE  Result Date: 07/12/2022    ECHOCARDIOGRAM REPORT   Patient Name:   Sandy Trujillo Date of Exam: 07/12/2022 Medical Rec #:  [id number]         Height:       60.0 in Accession #:    [id number]  Weight:       131.8 lb Date of Birth:  [birth year]        BSA:          1.564 m Patient Age:    89 years          BP:           135/57 mmHg Patient Gender: F                 HR:           64 bpm. Exam Location:  Inpatient Procedure: 2D Echo, Cardiac Doppler and Color Doppler Indications:    CHF - Acute Diastolic  History:        Patient has prior history of Echocardiogram examinations, most                 recent 05/10/2022. CHF, cancer, Arrythmias:Atrial Fibrillation                 and 2nd degree AV block, Signs/Symptoms:Edema and Shortness of                 Breath; Risk Factors:Hypertension and Dyslipidemia.  Sonographer:    Wallie Char Referring Phys: [id number] MOHAMMAD L GARBA IMPRESSIONS  1. Left ventricular ejection fraction, by estimation, is 40 to 45%. The left ventricle has mildly decreased function. The left ventricle demonstrates regional wall motion abnormalities (see scoring diagram/findings for description). Left ventricular diastolic parameters are indeterminate.  2. Right ventricular systolic function is moderately reduced. The right ventricular size is mildly enlarged. There is severely elevated pulmonary artery systolic pressure. The estimated right ventricular systolic pressure is 62.1 mmHg.  3. Left atrial size was severely dilated.  4. Right atrial size was  severely dilated.  5. There is a moderate to large pericardial effusion adjacent to the RV and RA on the subcostal images with mild diastolic collpase of the RA. The IVC is markedly dialted. Concern for early tamponade physiology. Moderate pericardial effusion. The pericardial effusion is posterior to the left ventricle, localized near the right atrium and anterior to the right ventricle. Large pleural effusion in the left lateral region.  6. The mitral valve is normal in structure. Moderate mitral valve regurgitation. No evidence of mitral stenosis.  7. Tricuspid valve regurgitation is moderate to severe.  8. The aortic valve is tricuspid. There is mild calcification of the aortic valve. Aortic valve regurgitation is not visualized. Aortic valve sclerosis/calcification is present, without any evidence of aortic stenosis.  9. The inferior vena cava is normal in size with greater than 50% respiratory variability, suggesting right atrial pressure of 3 mmHg. Conclusion(s)/Recommendation(s): There is a moderate to large pericardial effusion adjacent to the RV and RA on the subcostal images with mild diastolic collpase of the RA. The IVC is markedly dialted. Concern for early tamponade physiology. FINDINGS  Left Ventricle: Left ventricular ejection fraction, by estimation, is 40 to 45%. The left ventricle has mildly decreased function. The left ventricle demonstrates regional wall motion abnormalities. The left ventricular internal cavity size was normal in size. There is no left ventricular hypertrophy. Left ventricular diastolic parameters are indeterminate.  LV Wall Scoring: The entire septum and posterior wall are hypokinetic. Right Ventricle: The right ventricular size is mildly enlarged. No increase in right ventricular wall thickness. Right ventricular systolic function is moderately reduced. There is severely elevated pulmonary artery systolic pressure. The tricuspid regurgitant velocity is 3.43 m/s, and with an  assumed right atrial pressure of 15 mmHg, the  estimated right ventricular systolic pressure is 62.1 mmHg. Left Atrium: Left atrial size was severely dilated. Right Atrium: Right atrial size was severely dilated. Pericardium: There is a moderate to large pericardial effusion adjacent to the RV and RA on the subcostal images with mild diastolic collpase of the RA. The IVC is markedly dialted. Concern for early tamponade physiology. A moderately sized pericardial effusion is present. The pericardial effusion is posterior to the left ventricle, localized near the right atrium and anterior to the right ventricle. Mitral Valve: The mitral valve is normal in structure. Moderate mitral valve regurgitation, with centrally-directed jet. No evidence of mitral valve stenosis. MV peak gradient, 6.8 mmHg. The mean mitral valve gradient is 2.0 mmHg. Tricuspid Valve: The tricuspid valve is normal in structure. Tricuspid valve regurgitation is moderate to severe. No evidence of tricuspid stenosis. Aortic Valve: The aortic valve is tricuspid. There is mild calcification of the aortic valve. Aortic valve regurgitation is not visualized. Aortic valve sclerosis/calcification is present, without any evidence of aortic stenosis. Aortic valve mean gradient measures 5.0 mmHg. Aortic valve peak gradient measures 10.2 mmHg. Aortic valve area, by VTI measures 1.66 cm. Pulmonic Valve: The pulmonic valve was normal in structure. Pulmonic valve regurgitation is trivial. No evidence of pulmonic stenosis. Aorta: The aortic root is normal in size and structure. Venous: The inferior vena cava is normal in size with greater than 50% respiratory variability, suggesting right atrial pressure of 3 mmHg. IAS/Shunts: No atrial level shunt detected by color flow Doppler. Additional Comments: There is a large pleural effusion in the left lateral region.  LEFT VENTRICLE PLAX 2D LVIDd:         4.80 cm     Diastology LVIDs:         3.70 cm     LV e' medial:     4.72 cm/s LV PW:         0.80 cm     LV E/e' medial:  26.3 LV IVS:        0.70 cm     LV e' lateral:   6.39 cm/s LVOT diam:     1.70 cm     LV E/e' lateral: 19.4 LV SV:         63 LV SV Index:   40 LVOT Area:     2.27 cm  LV Volumes (MOD) LV vol d, MOD A2C: 78.4 ml LV vol d, MOD A4C: 69.5 ml LV vol s, MOD A2C: 36.8 ml LV vol s, MOD A4C: 33.4 ml LV SV MOD A2C:     41.6 ml LV SV MOD A4C:     69.5 ml LV SV MOD BP:      38.4 ml RIGHT VENTRICLE            IVC RV Basal diam:  4.20 cm    IVC diam: 2.00 cm RV S prime:     9.05 cm/s TAPSE (M-mode): 1.5 cm LEFT ATRIUM             Index        RIGHT ATRIUM           Index LA diam:        5.10 cm 3.26 cm/m   RA Area:     28.70 cm LA Vol (A2C):   78.1 ml 49.95 ml/m  RA Volume:   117.00 ml 74.83 ml/m LA Vol (A4C):   64.3 ml 41.12 ml/m LA Biplane Vol: 72.1 ml 46.11 ml/m  AORTIC VALVE AV Area (  Vmax):    1.70 cm AV Area (Vmean):   1.64 cm AV Area (VTI):     1.66 cm AV Vmax:           159.50 cm/s AV Vmean:          106.500 cm/s AV VTI:            0.376 m AV Peak Grad:      10.2 mmHg AV Mean Grad:      5.0 mmHg LVOT Vmax:         119.33 cm/s LVOT Vmean:        76.833 cm/s LVOT VTI:          0.276 m LVOT/AV VTI ratio: 0.73  AORTA Ao Root diam: 3.00 cm Ao Asc diam:  3.20 cm MITRAL VALVE                  TRICUSPID VALVE MV Area (PHT): 4.86 cm       TR Peak grad:   47.1 mmHg MV Area VTI:   1.96 cm       TR Mean grad:   30.0 mmHg MV Peak grad:  6.8 mmHg       TR Vmax:        343.00 cm/s MV Mean grad:  2.0 mmHg       TR Vmean:       259.0 cm/s MV Vmax:       1.30 m/s MV Vmean:      59.0 cm/s      SHUNTS MV Decel Time: 156 msec       Systemic VTI:  0.28 m MR Peak grad:    151.8 mmHg   Systemic Diam: 1.70 cm MR Mean grad:    104.0 mmHg MR Vmax:         616.00 cm/s MR Vmean:        489.0 cm/s MR PISA:         2.26 cm MR PISA Eff ROA: 11 mm MR PISA Radius:  0.60 cm MV E velocity: 124.00 cm/s MV A velocity: 52.67 cm/s MV E/A ratio:  2.35 Arvilla Meres MD Electronically signed by  Arvilla Meres MD Signature Date/Time: 07/12/2022/5:06:43 PM    Final    CT Head Wo Contrast  Result Date: 07/11/2022 CLINICAL DATA:  Altered mental status. EXAM: CT HEAD WITHOUT CONTRAST TECHNIQUE: Contiguous axial images were obtained from the base of the skull through the vertex without intravenous contrast. RADIATION DOSE REDUCTION: This exam was performed according to the departmental dose-optimization program which includes automated exposure control, adjustment of the mA and/or kV according to patient size and/or use of iterative reconstruction technique. COMPARISON:  None Available. FINDINGS: Brain: Mild age-related atrophy and chronic microvascular ischemic changes. There is no acute intracranial hemorrhage. No mass effect or midline shift. No extra-axial fluid collection. Vascular: No hyperdense vessel or unexpected calcification. Skull: Normal. Negative for fracture or focal lesion. Sinuses/Orbits: No acute finding. Other: None IMPRESSION: 1. No acute intracranial pathology. 2. Mild age-related atrophy and chronic microvascular ischemic changes. Electronically Signed   By: Elgie Collard M.D.   On: 07/11/2022 19:01   DG Chest 2 View  Result Date: 07/11/2022 CLINICAL DATA:  SOB EXAM: CHEST - 2 VIEW COMPARISON:  June 30, 2022 FINDINGS: The cardiomediastinal silhouette is unchanged in contour. Small bilateral pleural effusions, mildly increased. No pneumothorax. Mildly increased bibasilar homogeneous platelike opacities. Mildly increased interstitial markings diffusely. Visualized abdomen is unremarkable. Mild multilevel degenerative changes of the thoracic  spine. IMPRESSION: Constellation of findings are favored to reflect mild pulmonary edema with increased small bilateral pleural effusions and bibasilar atelectasis. Electronically Signed   By: Meda Klinefelter M.D.   On: 07/11/2022 15:47     Patient Profile     89 year old female with past medical history of persistent atrial  fibrillation, hypertension, hyperlipidemia, history of rectal carcinoma status post colectomy, ulcerative colitis, history of GI bleed for evaluation of acute diastolic congestive heart failure. Patient diagnosed with new onset atrial fibrillation in April 2024. She was treated with Eliquis and underwent successful cardioversion in May. Patient states that she has had fatigue and her symptoms did not improve following cardioversion. 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 She also has had difficulties with bradycardia. She was seen by Dr. Nelly Laurence and pacemaker not felt indicated at this point. Monitor showed atrial fibrillation with heart rate between 39 and 93 with average 65. She was readmitted with complaints of new onset lower ext edema, decreased mental status and functional decline.   Echocardiogram this admission shows ejection fraction 40 to 45% (I will need and feel it is low normal) which is decreased compared to previous, moderate RV dysfunction, mild right ventricular enlargement, severe pulmonary hypertension, severe biatrial enlargement, moderate to large pericardial effusion with mild diastolic collapse of the right atrium, large left pleural effusion; concern for early tamponade.  Assessment & Plan    1 Pericardial effusion-I have reviewed the patient's echocardiogram from April which showed trivial pericardial effusion.  I also personally reviewed the echocardiogram from this admission.  There appears to be a moderate pericardial effusion best visualized on subcostal views.  Clinically she is not in tamponade as her heart rate is normal and her blood pressure is 157/75 this morning.  I reviewed the images with Dr. Excell Seltzer and does pericardiocentesis continue performed he does not without risk.  Also not taking any type this is causing her bilateral lower extremity edema but certainly could be contributing.  For now we  will hold apixaban.  Will continue diuresis for edema.  Will check noncontrast chest CT for pericardial effusion and pleural effusions (question if she needs thoracentesis).  May need right and left cardiac catheterization later this week to rule out tamponade physiology and if this is evident we will need to proceed with pericardiocentesis at that time.  Will plan to repeat echocardiogram in 2 to 3 days.  2 acute diastolic congestive heart failure-will plan to continue Lasix at present dose.  She appears to be developing a contraction alkalosis.  Add acetazolamide.  I have personally reviewed patient's follow-up echocardiogram and ejection fraction is low normal. Albumin is 3.5.    3 persistent atrial fibrillation-patient underwent recent cardioversion but atrial fibrillation recurred.  Her symptoms did not improve following cardioversion.  She was seen by Dr. Nelly Laurence and thought at present was to continue with rate control/anticoagulation.  She is requiring no AV nodal blocking agents.  He also felt that if rhythm control pursued she would require pacemaker.  Hold apixaban in setting of pericardial effusion and for possible procedures in the near future.   4 declining mental status-I do not think this is related to atrial fibrillation or CHF.  Further evaluation per primary service.   5 hyponatremia-sodium with some improvement today.  6 hypertension-continue present medications and follow blood pressure.  For questions or updates, please contact [facility] Please consult [email or web address] for  contact info under        Signed, Olga Millers, MD  07/13/2022, 7:43 AM

## 2022-07-13 NOTE — TOC Progression Note (Addendum)
Transition of Care [facility]) - Progression Note    Patient Details  Name: Sandy Trujillo MRN: [id number] Date of Birth: 08/21/1932  Transition of Care [facility] CM/SW Contact  Delilah Shan, LCSWA Phone Number: 07/13/2022, 12:13 PM  Clinical Narrative:     CSW spoke with Sandy Trujillo patients daughter at bedside and spoke with Sandy Trujillo patients daughter on the phone at bedside. CSW provided SNF bed offers for patient. Sandy Trujillo request to look over SNF bed offers and will give CSW call back tomorrow after reviewing. Sandy Trujillo confirmed no bed availability. CSW informed Sandy Trujillo patients daughter. CSW left medicare compare SNF ratings list with accepted SNF bed offers for Sandy Trujillo to review. All questions answered. No further questions reported at this time. CSW will continue to follow and assist with patients dc planning needs.  Patients daughter Sandy Trujillo requested for CSW to [facility]. CSW faxed out to [facility]. Sandy Trujillo with [facility] informed CSW they can make SNF bed offer for patient. CSW informed patients daughter Sandy Trujillo that Sandy Trujillo can offer.and that Sandy Trujillo with [facility] is reviewing and confirmed will call CSW back with determination. CSW will continue to follow.  Patients passr has been approved and added to patients [id number] [phone] [id number].  Expected Discharge Plan: Skilled Nursing Facility Barriers to Discharge: Continued Medical Work up  Expected Discharge Plan and Services In-house Referral: Clinical Social Work     Living arrangements for the past 2 months: Single Family Home                                       Social Determinants of Health (SDOH) Interventions SDOH Screenings   Food Insecurity: No Food Insecurity (07/11/2022)  Housing: Patient Unable To Answer (07/11/2022)  Transportation Needs: No Transportation Needs (07/11/2022)  Utilities: Not At Risk (07/11/2022)  Alcohol Screen: Low Risk  (07/11/2022)  Depression (PHQ2-9): Low Risk  (07/11/2022)   Financial Resource Strain: Low Risk  (07/11/2022)  Physical Activity: Insufficiently Active (07/11/2022)  Stress: No Stress Concern Present (07/11/2022)  Tobacco Use: Low Risk  (07/11/2022)    Readmission Risk Interventions     No data to display

## 2022-07-14 DIAGNOSIS — I5031 Acute diastolic (congestive) heart failure: Secondary | ICD-10-CM | POA: Diagnosis not present

## 2022-07-14 DIAGNOSIS — G9341 Metabolic encephalopathy: Secondary | ICD-10-CM | POA: Diagnosis not present

## 2022-07-14 LAB — GLUCOSE, CAPILLARY
Glucose-Capillary: 96 mg/dL (ref 70–99)
Glucose-Capillary: 80 mg/dL (ref 70–99)
Glucose-Capillary: 87 mg/dL (ref 70–99)
Glucose-Capillary: 162 mg/dL — ABNORMAL HIGH (ref 70–99)
Glucose-Capillary: 162 mg/dL — ABNORMAL HIGH (ref 70–99)
Glucose-Capillary: 132 mg/dL — ABNORMAL HIGH (ref 70–99)

## 2022-07-14 LAB — BASIC METABOLIC PANEL
Anion gap: 12 (ref 5–15)
BUN: 10 mg/dL (ref 8–23)
CO2: 38 mmol/L — ABNORMAL HIGH (ref 22–32)
Calcium: 9.1 mg/dL (ref 8.9–10.3)
Chloride: 80 mmol/L — ABNORMAL LOW (ref 98–111)
Creatinine, Ser: 0.82 mg/dL (ref 0.44–1.00)
GFR, Estimated: >60 mL/min (ref >60)
Glucose, Bld: 103 mg/dL — ABNORMAL HIGH (ref 70–99)
Potassium: 3 mmol/L — ABNORMAL LOW (ref 3.5–5.1)
Sodium: 130 mmol/L — ABNORMAL LOW (ref 135–145)

## 2022-07-14 LAB — CBC
HCT: 35.8 % — ABNORMAL LOW (ref 36.0–46.0)
Hemoglobin: 11.4 g/dL — ABNORMAL LOW (ref 12.0–15.0)
MCH: 27 pg (ref 26.0–34.0)
MCHC: 31.8 g/dL (ref 30.0–36.0)
MCV: 84.6 fL (ref 80.0–100.0)
Platelets: 144 10*3/uL — ABNORMAL LOW (ref 150–400)
RBC: 4.23 MIL/uL (ref 3.87–5.11)
RDW: 15.8 % — ABNORMAL HIGH (ref 11.5–15.5)
WBC: 8.5 10*3/uL (ref 4.0–10.5)
nRBC: 0 % (ref 0.0–0.2)

## 2022-07-14 MED ORDER — SPIRONOLACTONE 12.5 MG HALF TABLET
12.5000 mg | ORAL_TABLET | Freq: Every day | ORAL | Status: DC
  Administered 2022-07-14 – 2022-07-17 (×4): 12.5 mg via ORAL
  Filled 2022-07-14 (×5): qty 1

## 2022-07-14 MED ORDER — FUROSEMIDE 10 MG/ML IJ SOLN
40.0000 mg | Freq: Every day | INTRAMUSCULAR | Status: DC
  Administered 2022-07-14: 40 mg via INTRAVENOUS
  Filled 2022-07-14: qty 4

## 2022-07-14 MED ORDER — APIXABAN 2.5 MG PO TABS
2.5000 mg | ORAL_TABLET | Freq: Two times a day (BID) | ORAL | Status: DC
  Administered 2022-07-14 – 2022-07-17 (×6): 2.5 mg via ORAL
  Filled 2022-07-14 (×8): qty 1

## 2022-07-14 NOTE — Progress Notes (Signed)
Rounding Note    Patient Name: Sandy Trujillo Date of Encounter: 07/14/2022  [facility] Cardiologist: Reatha Harps, MD   Subjective   No dyspnea or CP  Inpatient Medications    Scheduled Meds:  acetaZOLAMIDE  250 mg Oral BID   feeding supplement  237 mL Oral BID BM   furosemide  40 mg Intravenous BID   lisinopril  10 mg Oral Daily   loratadine  10 mg Oral Daily   multivitamin with minerals  1 tablet Oral Daily   nystatin   Topical TID   pantoprazole  40 mg Oral Daily   potassium chloride  20 mEq Oral Daily   sodium chloride flush  3 mL Intravenous Q12H   Continuous Infusions:  sodium chloride     PRN Meds: sodium chloride, acetaminophen, ALPRAZolam, dextrose, ondansetron (ZOFRAN) IV, sodium chloride flush   Vital Signs    Vitals:   07/13/22 2108 07/14/22 0056 07/14/22 0427 07/14/22 0500  BP: 130/65 130/70 128/62   Pulse: 65 67 71   Resp: 18 18 16    Temp: 97.7 F (36.5 C) 97.8 F (36.6 C) 97.8 F (36.6 C)   TempSrc: Oral Oral Oral   SpO2: 94% 99% 98%   Weight:    53.6 kg  Height:        Intake/Output Summary (Last 24 hours) at 07/14/2022 0723 Last data filed at 07/14/2022 0519 Gross per 24 hour  Intake --  Output 1700 ml  Net -1700 ml       07/14/2022    5:00 AM 07/13/2022    4:08 AM 07/12/2022    5:00 AM  Last 3 Weights  Weight (lbs) 118 lb 2.7 oz 126 lb 5.2 oz 131 lb 13.4 oz  Weight (kg) 53.6 kg 57.3 kg 59.8 kg      Telemetry    Atrial fibrillation rate controlled - Personally Reviewed  Physical Exam   GEN: NAD Neck: supple, no JVD Cardiac: irrregular, no gallop Respiratory: Diminished breath sounds bases GI: Soft, NT/ND MS: 1+ edema Neuro:  Grossly intact Psych: Normal affect   Labs   Chemistry Recent Labs  Lab 07/11/22 1503 07/12/22 0217 07/13/22 0233 07/14/22 0035  NA 123* 126* 129* 130*  K 4.0 3.9 3.1* 3.0*  CL 80* 83* 78* 80*  CO2 31 34* 40* 38*  GLUCOSE 62* 83 61* 103*  BUN 14 15 10 10   CREATININE  0.70 0.61 0.83 0.82  CALCIUM 9.0 8.6* 8.6* 9.1  PROT 6.7  --   --   --   ALBUMIN 3.5  --   --   --   AST 62*  --   --   --   ALT 51*  --   --   --   ALKPHOS 76  --   --   --   BILITOT 1.4*  --   --   --   GFRNONAA >60 >60 >60 >60  ANIONGAP 12 9 11 12     Hematology Recent Labs  Lab 07/11/22 1503 07/14/22 0035  WBC 5.1 8.5  RBC 4.13 4.23  HGB 11.3* 11.4*  HCT 36.0 35.8*  MCV 87.2 84.6  MCH 27.4 27.0  MCHC 31.4 31.8  RDW 15.9* 15.8*  PLT 174 144*    Thyroid  Recent Labs  Lab 07/11/22 1501  TSH 1.193     BNP Recent Labs  Lab 07/11/22 1501  BNP 664.9*      Radiology    CT CHEST WO CONTRAST  Result Date: 07/13/2022 CLINICAL DATA:  Pleural effusion, pericardial effusion. EXAM: CT CHEST WITHOUT CONTRAST TECHNIQUE: Multidetector CT imaging of the chest was performed following the standard protocol without IV contrast. RADIATION DOSE REDUCTION: This exam was performed according to the departmental dose-optimization program which includes automated exposure control, adjustment of the mA and/or kV according to patient size and/or use of iterative reconstruction technique. COMPARISON:  Chest radiograph 07/11/2022, CTA chest 05/10/2022 FINDINGS: Cardiovascular: The heart is enlarged. Coronary artery calcifications, aortic valve calcifications, and calcified plaque in the thoracic aorta are noted. There is no pericardial effusion. Mediastinum/Nodes: The thyroid is unremarkable. The esophagus is patulous with layering fluid. There is no mediastinal, hilar, or axillary lymphadenopathy. Lungs/Pleura: The trachea and central airways are patent. The moderate-sized right and small left pleural effusion are increased in size since 05/09/2022, with increased adjacent atelectasis in both lower lobes. Previously seen nodular ground-glass opacities have overall improved. There is no pneumothorax. Upper Abdomen: The imaged portions of the upper abdominal viscera are unremarkable. Musculoskeletal:  There is no acute osseous abnormality or suspicious osseous lesion. IMPRESSION: 1. Moderate-sized right and small left pleural effusions have increased in size since 05/09/2022, with increased adjacent atelectasis in both lower lobes. 2. Previously seen nodular ground-glass opacities have overall improved which may reflect improved pneumonia or aspiration. 3. Patulous esophagus with layering fluid. Electronically Signed   By: Lesia Hausen M.D.   On: 07/13/2022 14:08   ECHOCARDIOGRAM COMPLETE  Result Date: 07/12/2022    ECHOCARDIOGRAM REPORT   Patient Name:   Sandy Trujillo Date of Exam: 07/12/2022 Medical Rec #:  [id number]         Height:       60.0 in Accession #:    [id number]        Weight:       131.8 lb Date of Birth:  19-Mar-1932        BSA:          1.564 m Patient Age:    89 years          BP:           135/57 mmHg Patient Gender: F                 HR:           64 bpm. Exam Location:  Inpatient Procedure: 2D Echo, Cardiac Doppler and Color Doppler Indications:    CHF - Acute Diastolic  History:        Patient has prior history of Echocardiogram examinations, most                 recent 05/10/2022. CHF, cancer, Arrythmias:Atrial Fibrillation                 and 2nd degree AV block, Signs/Symptoms:Edema and Shortness of                 Breath; Risk Factors:Hypertension and Dyslipidemia.  Sonographer:    Wallie Char Referring Phys: [id number] MOHAMMAD L GARBA IMPRESSIONS  1. Left ventricular ejection fraction, by estimation, is 40 to 45%. The left ventricle has mildly decreased function. The left ventricle demonstrates regional wall motion abnormalities (see scoring diagram/findings for description). Left ventricular diastolic parameters are indeterminate.  2. Right ventricular systolic function is moderately reduced. The right ventricular size is mildly enlarged. There is severely elevated pulmonary artery systolic pressure. The estimated right ventricular systolic pressure is 62.1 mmHg.  3. Left atrial  size was severely dilated.  4.  Right atrial size was severely dilated.  5. There is a moderate to large pericardial effusion adjacent to the RV and RA on the subcostal images with mild diastolic collpase of the RA. The IVC is markedly dialted. Concern for early tamponade physiology. Moderate pericardial effusion. The pericardial effusion is posterior to the left ventricle, localized near the right atrium and anterior to the right ventricle. Large pleural effusion in the left lateral region.  6. The mitral valve is normal in structure. Moderate mitral valve regurgitation. No evidence of mitral stenosis.  7. Tricuspid valve regurgitation is moderate to severe.  8. The aortic valve is tricuspid. There is mild calcification of the aortic valve. Aortic valve regurgitation is not visualized. Aortic valve sclerosis/calcification is present, without any evidence of aortic stenosis.  9. The inferior vena cava is normal in size with greater than 50% respiratory variability, suggesting right atrial pressure of 3 mmHg. Conclusion(s)/Recommendation(s): There is a moderate to large pericardial effusion adjacent to the RV and RA on the subcostal images with mild diastolic collpase of the RA. The IVC is markedly dialted. Concern for early tamponade physiology. FINDINGS  Left Ventricle: Left ventricular ejection fraction, by estimation, is 40 to 45%. The left ventricle has mildly decreased function. The left ventricle demonstrates regional wall motion abnormalities. The left ventricular internal cavity size was normal in size. There is no left ventricular hypertrophy. Left ventricular diastolic parameters are indeterminate.  LV Wall Scoring: The entire septum and posterior wall are hypokinetic. Right Ventricle: The right ventricular size is mildly enlarged. No increase in right ventricular wall thickness. Right ventricular systolic function is moderately reduced. There is severely elevated pulmonary artery systolic pressure. The  tricuspid regurgitant velocity is 3.43 m/s, and with an assumed right atrial pressure of 15 mmHg, the estimated right ventricular systolic pressure is 62.1 mmHg. Left Atrium: Left atrial size was severely dilated. Right Atrium: Right atrial size was severely dilated. Pericardium: There is a moderate to large pericardial effusion adjacent to the RV and RA on the subcostal images with mild diastolic collpase of the RA. The IVC is markedly dialted. Concern for early tamponade physiology. A moderately sized pericardial effusion is present. The pericardial effusion is posterior to the left ventricle, localized near the right atrium and anterior to the right ventricle. Mitral Valve: The mitral valve is normal in structure. Moderate mitral valve regurgitation, with centrally-directed jet. No evidence of mitral valve stenosis. MV peak gradient, 6.8 mmHg. The mean mitral valve gradient is 2.0 mmHg. Tricuspid Valve: The tricuspid valve is normal in structure. Tricuspid valve regurgitation is moderate to severe. No evidence of tricuspid stenosis. Aortic Valve: The aortic valve is tricuspid. There is mild calcification of the aortic valve. Aortic valve regurgitation is not visualized. Aortic valve sclerosis/calcification is present, without any evidence of aortic stenosis. Aortic valve mean gradient measures 5.0 mmHg. Aortic valve peak gradient measures 10.2 mmHg. Aortic valve area, by VTI measures 1.66 cm. Pulmonic Valve: The pulmonic valve was normal in structure. Pulmonic valve regurgitation is trivial. No evidence of pulmonic stenosis. Aorta: The aortic root is normal in size and structure. Venous: The inferior vena cava is normal in size with greater than 50% respiratory variability, suggesting right atrial pressure of 3 mmHg. IAS/Shunts: No atrial level shunt detected by color flow Doppler. Additional Comments: There is a large pleural effusion in the left lateral region.  LEFT VENTRICLE PLAX 2D LVIDd:         4.80 cm      Diastology LVIDs:  3.70 cm     LV e' medial:    4.72 cm/s LV PW:         0.80 cm     LV E/e' medial:  26.3 LV IVS:        0.70 cm     LV e' lateral:   6.39 cm/s LVOT diam:     1.70 cm     LV E/e' lateral: 19.4 LV SV:         63 LV SV Index:   40 LVOT Area:     2.27 cm  LV Volumes (MOD) LV vol d, MOD A2C: 78.4 ml LV vol d, MOD A4C: 69.5 ml LV vol s, MOD A2C: 36.8 ml LV vol s, MOD A4C: 33.4 ml LV SV MOD A2C:     41.6 ml LV SV MOD A4C:     69.5 ml LV SV MOD BP:      38.4 ml RIGHT VENTRICLE            IVC RV Basal diam:  4.20 cm    IVC diam: 2.00 cm RV S prime:     9.05 cm/s TAPSE (M-mode): 1.5 cm LEFT ATRIUM             Index        RIGHT ATRIUM           Index LA diam:        5.10 cm 3.26 cm/m   RA Area:     28.70 cm LA Vol (A2C):   78.1 ml 49.95 ml/m  RA Volume:   117.00 ml 74.83 ml/m LA Vol (A4C):   64.3 ml 41.12 ml/m LA Biplane Vol: 72.1 ml 46.11 ml/m  AORTIC VALVE AV Area (Vmax):    1.70 cm AV Area (Vmean):   1.64 cm AV Area (VTI):     1.66 cm AV Vmax:           159.50 cm/s AV Vmean:          106.500 cm/s AV VTI:            0.376 m AV Peak Grad:      10.2 mmHg AV Mean Grad:      5.0 mmHg LVOT Vmax:         119.33 cm/s LVOT Vmean:        76.833 cm/s LVOT VTI:          0.276 m LVOT/AV VTI ratio: 0.73  AORTA Ao Root diam: 3.00 cm Ao Asc diam:  3.20 cm MITRAL VALVE                  TRICUSPID VALVE MV Area (PHT): 4.86 cm       TR Peak grad:   47.1 mmHg MV Area VTI:   1.96 cm       TR Mean grad:   30.0 mmHg MV Peak grad:  6.8 mmHg       TR Vmax:        343.00 cm/s MV Mean grad:  2.0 mmHg       TR Vmean:       259.0 cm/s MV Vmax:       1.30 m/s MV Vmean:      59.0 cm/s      SHUNTS MV Decel Time: 156 msec       Systemic VTI:  0.28 m MR Peak grad:    151.8 mmHg   Systemic Diam: 1.70 cm MR Mean grad:  104.0 mmHg MR Vmax:         616.00 cm/s MR Vmean:        489.0 cm/s MR PISA:         2.26 cm MR PISA Eff ROA: 11 mm MR PISA Radius:  0.60 cm MV E velocity: 124.00 cm/s MV A velocity: 52.67 cm/s MV E/A  ratio:  2.35 Arvilla Meres MD Electronically signed by Arvilla Meres MD Signature Date/Time: 07/12/2022/5:06:43 PM    Final      Patient Profile     89 year old female with past medical history of persistent atrial fibrillation, hypertension, hyperlipidemia, history of rectal carcinoma status post colectomy, ulcerative colitis, history of GI bleed for evaluation of acute diastolic congestive heart failure. Patient diagnosed with new onset atrial fibrillation in April 2024. She was treated with Eliquis and underwent successful cardioversion in May. Patient states that she has had fatigue and her symptoms did not improve following cardioversion. 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 She also has had difficulties with bradycardia. She was seen by Dr. Nelly Laurence and pacemaker not felt indicated at this point. Monitor showed atrial fibrillation with heart rate between 39 and 93 with average 65. She was readmitted with complaints of new onset lower ext edema, decreased mental status and functional decline.   Echocardiogram this admission shows ejection fraction 40 to 45% (I will need and feel it is low normal) which is decreased compared to previous, moderate RV dysfunction, mild right ventricular enlargement, severe pulmonary hypertension, severe biatrial enlargement, moderate to large pericardial effusion with mild diastolic collapse of the right atrium, large left pleural effusion; concern for early tamponade.  Assessment & Plan    1 Pericardial effusion-I previously reviewed the patient's echocardiogram from this admission.  She appears to have a trivial pericardial effusion in most views but there appears to be moderate effusion in subcostal views predominantly anterior.  However noncontrast chest CT yesterday did not show pericardial effusion.  Clinically she is not in tamponade.  She is also 89 years old and frail.  I will  try to be conservative if possible.  Will resume anticoagulation for atrial fibrillation.  Continue gentle diuresis CHF.  Repeat echocardiogram on Friday.  As outlined previously it is not clear to me that the effusion is contributing to her lower extremity edema.    2 acute diastolic congestive heart failure-decrease Lasix to 40 mg daily.  Continue acetazolamide for contraction alkalosis.  Clinically she appears to be improving from a volume standpoint.     3 persistent atrial fibrillation-patient underwent recent cardioversion but atrial fibrillation recurred.  Her symptoms did not improve following cardioversion.  She was seen by Dr. Nelly Laurence and thought at present was to continue with rate control/anticoagulation.  She is requiring no AV nodal blocking agents.  He also felt that if rhythm control pursued she would require pacemaker.  Resume apixaban 2.5 mg twice daily.   4 declining mental status-I do not think this is related to atrial fibrillation or CHF.  Further evaluation per primary service.   5 hyponatremia-sodium improving.  6 hypertension-continue present medications and follow blood pressure.  For questions or updates, please contact [facility] Please consult [email or web address] for contact info under        Signed, Olga Millers, MD  07/14/2022, 7:23 AM

## 2022-07-14 NOTE — TOC Progression Note (Signed)
Transition of Care [facility]) - Progression Note    Patient Details  Name: DEAMBER WANZER MRN: [id number] Date of Birth: 05/17/1932  Transition of Care [facility]) CM/SW Contact  Delilah Shan, LCSWA Phone Number: 07/14/2022, 12:52 PM  Clinical Narrative:     CSW LVM for patients daughter Marylene Land. CSW awaiting call back to discuss SNF placement/SNF choice for patient. CSW will continue to follow and assist with patients dc planning needs.  Expected Discharge Plan: Skilled Nursing Facility Barriers to Discharge: Continued Medical Work up  Expected Discharge Plan and Services In-house Referral: Clinical Social Work     Living arrangements for the past 2 months: Single Family Home                                       Social Determinants of Health (SDOH) Interventions SDOH Screenings   Food Insecurity: No Food Insecurity (07/11/2022)  Housing: Patient Unable To Answer (07/11/2022)  Transportation Needs: No Transportation Needs (07/11/2022)  Utilities: Not At Risk (07/11/2022)  Alcohol Screen: Low Risk  (07/11/2022)  Depression (PHQ2-9): Low Risk  (07/11/2022)  Financial Resource Strain: Low Risk  (07/11/2022)  Physical Activity: Insufficiently Active (07/11/2022)  Stress: No Stress Concern Present (07/11/2022)  Tobacco Use: Low Risk  (07/11/2022)    Readmission Risk Interventions     No data to display

## 2022-07-14 NOTE — Care Management Important Message (Signed)
Important Message  Patient Details  Name: Sandy Trujillo MRN: [id number] Date of Birth: [birth year]   Medicare Important Message Given:  Yes     Sherilyn Banker 07/14/2022, 4:35 PM

## 2022-07-14 NOTE — TOC Progression Note (Signed)
Transition of Care [facility]) - Progression Note    Patient Details  Name: Sandy Trujillo MRN: [id number] Date of Birth: [birth year]  Transition [facility] Care [facility]) CM/SW Contact  Graves-Bigelow, Lamar Laundry, RN Phone Number: 07/14/2022, 5:01 PM  Clinical Narrative: Case Manager spoke with family post palliative consult. Plan will be for home with palliative and home health services. Patient has DME toilet riser, low bed, and borrowed wheelchair in the home. Family wants a rolling walker via [employer]- Case Manager to order and have it delivered to the room. Family has decided to use [facility] for outpatient palliative services- referral made and the office will call the daughter [name] with plan of care. Family wants to use [facility] for [facility] RN/PT/OT/Aide services-referral submitted and start of care to begin within 24-48 hours post transition home. Family is following up with [facility] for personal care services. Case Manager will continue to follow for transition of care needs as the patient progresses.     Expected Discharge Plan: Home w Home Health Services Barriers to Discharge: No Barriers Identified  Expected Discharge Plan and Services In-house Referral: NA Discharge Planning Services: CM Consult Post Acute Care Choice: Home Health, Resumption of Svcs/PTA Provider Living arrangements for the past 2 months: Single Family Home                   DME Agency: NA       HH Arranged: RN, Disease Management, PT, OT, Nurse's Aide HH Agency: [facility] ([facility] Date HH Agency Contacted: 07/14/22 Time HH Agency Contacted: 1700 Representative spoke with at [facility] Agency: Morrie Sheldon   Social Determinants of Health (SDOH) Interventions SDOH Screenings   Food Insecurity: No Food Insecurity (07/11/2022)  Housing: Patient Unable To Answer (07/11/2022)  Transportation Needs: No Transportation Needs (07/11/2022)  Utilities: Not At Risk (07/11/2022)  Alcohol Screen: Low Risk   (07/11/2022)  Depression (PHQ2-9): Low Risk  (07/11/2022)  Financial Resource Strain: Low Risk  (07/11/2022)  Physical Activity: Insufficiently Active (07/11/2022)  Stress: No Stress Concern Present (07/11/2022)  Tobacco Use: Low Risk  (07/11/2022)    Readmission Risk Interventions     No data to display

## 2022-07-14 NOTE — Progress Notes (Signed)
Daily Progress Note   Patient Name: Sandy Trujillo       Date: 07/14/2022 DOB: 1932-11-02  Age: 89 y.o. MRN#: [id number] Attending Physician: Zannie Cove, MD Primary Care Physician: Thana Ates, MD Admit Date: 07/11/2022  Reason for Consultation/Follow-up: Establishing goals of care  Subjective: Medical records reviewed including progress notes, labs, and imaging. Patient assessed at the bedside. She is sleeping comfortably. Stepped away from bedside and joined patient's four adult children in 6E waiting room for scheduled family meeting.  I re-introduced Palliative Medicine as specialized medical care for people living with serious illness. It focuses on providing relief from the symptoms and stress of a serious illness. The goal is to improve quality of life for both the patient and the family. Explained the difference between palliative care and hospice. Provided education on patient's acute illness with emphasis on new CHF and typical disease trajectory. We also reviewed patient's overall decline and failure to thrive over the past few months, though she appears to be rallying and improving today thankfully. Counseled on potential prognosis of months to a year, though anything can happen at any time.   Patient's family were shocked by palliative care involvement though appreciative of the extra support and ready to consider the difficult conversations likely to occur in the future. They are still unclear on why patient's pacemaker was deferred and wonder if this may have made a difference in patient's prognosis. Offered to discuss further with cardiology and assist with clarifying.   Patient's family are now interested in taking her home rather than rehab. She is a very anxious person  and they feel this would be the most comfortable for her and most beneficial for her quality of life. They hope that she can make some additional progress with HH therapies, while also understanding this may not occur. Family would also like to know anticipated discharge date so that they may coordinate in-home caregivers to assist. They would be willing to arrange for 24/7 support if recommended.   Recommended consideration of DNR status, understanding evidenced-based poor outcomes in similar hospitalized patients, as the cause of the arrest is likely associated with chronic/terminal disease rather than a reversible acute cardio-pulmonary event. Patient's children are in agreement with DNR, although they would like more time to speak with her and ease her worries  about this decision. Provided with a blank copy to assist at their request. Outpatient palliative care was explained and offered. Family are agreeable. We discussed what an eventual transition to hospice would look like if patient did not improve or had worsening quality of life at home.   Questions and concerns addressed. PMT will continue to support holistically.   Length of Stay: 3   Physical Exam Vitals and nursing note reviewed.  Constitutional:      General: She is sleeping. She is not in acute distress.    Appearance: She is ill-appearing.     Interventions: Nasal cannula in place.  Cardiovascular:     Rate and Rhythm: Normal rate. Rhythm irregular.  Pulmonary:     Effort: Pulmonary effort is normal.            Vital Signs: BP (!) 147/64 (BP Location: Left Arm)   Pulse 71   Temp 97.7 F (36.5 C) (Oral)   Resp 16   Ht 5' (1.524 m)   Wt 53.6 kg   SpO2 98%   BMI 23.08 kg/m  SpO2: SpO2: 98 % O2 Device: O2 Device: Nasal Cannula O2 Flow Rate: O2 Flow Rate (L/min): 1 L/min      Palliative Assessment/Data: 50%   Palliative Care Assessment & Plan   Patient Profile: 89 y.o. female  with past medical history of GERD,  hyperparathyroidism, essential hypertension, HLD, history of ileostomy, osteoporosis, history of rectal cancer, history of anemia and atrial fibrillation, ulcerative colitis, history of GI bleed admitted on 07/11/2022 with altered mental status.    Patient was diagnosed with new onset atrial fibrillation in April 2024. She was treated with Eliquis and underwent successful cardioversion in May. Patient states that she has had fatigue and her symptoms did not improve following cardioversion. 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 She also has had difficulties with bradycardia and new onset systolic CHF.    Echocardiogram this admission shows ejection fraction 40 to 45% which is decreased compared to previous, moderate RV dysfunction, mild right ventricular enlargement, severe pulmonary hypertension, severe biatrial enlargement, moderate to large pericardial effusion with mild diastolic collapse of the right atrium, large left pleural effusion; concern for early tamponade.    PMT has been consulted to assist with goals of care conversation.  Assessment: Goals of care conversation Afib, rate controlled Acute diastolic congestive heart failure  Acute metabolic encephalopathy, improved Failure to thrive  Recommendations/Plan: Continue full code. Family agreeable to DNR but want to gently approach conversation with patient before finalizing decision Continue current care Goal is now to return home with [facility] and outpatient palliative care follow up. TOC was consulted and assistance with referral is appreciated Goal is also to prioritize comfort and quality of life while continuing gentle interventions and treatments Ongoing GOC discussions Psychosocial and emotional support provided PMT will continue to follow and support   Prognosis:  Months to years  Discharge Planning: Home with Palliative Services  Care plan was  discussed with patient's 4 children, MD, TOC   Total time: I spent 65 minutes in the care of the patient today in the above activities and documenting the encounter.  MDM High         Julion Gatt Jeni Salles, PA-C  Palliative Medicine Team Team phone # [phone]  Thank you for allowing the Palliative Medicine Team to assist in the care of this patient. Please utilize secure chat  with additional questions, if there is no response within 30 minutes please call the above phone number.  Palliative Medicine Team providers are available by phone from 7am to 7pm daily and can be reached through the team cell phone.  Should this patient require assistance outside of these hours, please call the patient's attending physician.

## 2022-07-14 NOTE — Progress Notes (Signed)
PROGRESS NOTE Sandy Trujillo  NWG:[id number] DOB: Sep 18, 1932 DOA: 07/11/2022 PCP: Thana Ates, MD  Brief Narrative/Hospital Course: 89/F with history of ulcerative colitis, total colectomy, ileostomy, chronic diastolic CHF was diagnosed with A-fib in 4/24, underwent successful cardioversion in May subsequently diagnosed with anemia and small bowel AVM requiring blood transfusions. -Admitted with functional decline, worsening edema and worsening mental status.  Noted to have worsening cardiomyopathy, volume overload, hyponatremia, EF down to 40-45% moderate RV dysfunction and severe pulmonary hypertension   Subjective: Feels better overall, breathing is improving, mental status better  Assessment and Plan:  New onset systolic CHF EF 40-45%  Moderately reduced RV systolic function, severely elevated pulmonary artery systolic pressure Large pleural effusion on the left: -Cards following, continue IV Lasix today -Add Aldactone -Depending on BP, can consider SGLT2i  Moderate pericardial effusion -Clinically without evidence of tamponade, improving with diuretics -Continue current diuretics, repeat echo on Friday -Conservative management  Persistent atrial fibrillation recentcardioversion 06/23/2021 but failed,  -Resumed Eliquis  Acute metabolic encephalopathy:  -Likely secondary to hyponatremia, improving  Hypoglycemia: Not on OHA and no known history of diabetes. -Now resolved, monitor  Hypervolemia hyponatremia:  -Improving with diuresis  Hypokalemia repleted  History of ulcerative colitis, total colectomy -Has ileostomy  Goals of care/CODE STATUS: Multiple comorbidities and advanced age, frailty, palliative following  DVT prophylaxis: Apixaban Code Status:   Code Status: Full Code Family Communication: Discussed with family at bedside Dispo, plan for SNF at discharge    Objective: Vitals last 24 hrs: Vitals:   07/14/22 0427 07/14/22 0500 07/14/22 0753 07/14/22  1209  BP: 128/62  (!) 147/64 (!) 128/55  Pulse: 71     Resp: 16  16   Temp: 97.8 F (36.6 C)  97.7 F (36.5 C) 97.9 F (36.6 C)  TempSrc: Oral  Oral Oral  SpO2: 98%     Weight:  53.6 kg    Height:       Weight change: -3.7 kg  Physical Examination: Gen: Awake, Alert, Oriented X 3,  HEENT: +JVD Lungs: Good air movement bilaterally, CTAB CVS: S1S2/RRR Abd: soft, Non tender, non distended, BS present Extremities: 1 plus edema Skin: no new rashes on exposed skin   Medications reviewed:  Scheduled Meds:  acetaZOLAMIDE  250 mg Oral BID   apixaban  2.5 mg Oral BID   feeding supplement  237 mL Oral BID BM   furosemide  40 mg Intravenous Daily   lisinopril  10 mg Oral Daily   loratadine  10 mg Oral Daily   multivitamin with minerals  1 tablet Oral Daily   nystatin   Topical TID   pantoprazole  40 mg Oral Daily   potassium chloride  20 mEq Oral Daily   sodium chloride flush  3 mL Intravenous Q12H   Continuous Infusions:  sodium chloride        Diet Order             Diet regular Room service appropriate? Yes; Fluid consistency: Thin  Diet effective now                   Intake/Output Summary (Last 24 hours) at 07/14/2022 1359 Last data filed at 07/14/2022 1221 Gross per 24 hour  Intake 240 ml  Output 800 ml  Net -560 ml   Net IO Since Admission: -4,810 mL [07/14/22 1359]  Wt Readings from Last 3 Encounters:  07/14/22 53.6 kg  06/28/22 59.6 kg  06/22/22 58.6 kg     Unresulted  Labs (From admission, onward)     Start     Ordered   07/12/22 1540  Urinalysis, Routine w reflex microscopic -Urine, Catheterized  Once,   R       Question:  Specimen Source  Answer:  Urine, Catheterized   07/12/22 1540          Data Reviewed: I have personally reviewed following labs and imaging studies CBC: Recent Labs  Lab 07/11/22 1503 07/14/22 0035  WBC 5.1 8.5  HGB 11.3* 11.4*  HCT 36.0 35.8*  MCV 87.2 84.6  PLT 174 144*   Basic Metabolic Panel: Recent Labs   Lab 07/11/22 1503 07/12/22 0217 07/13/22 0233 07/14/22 0035  NA 123* 126* 129* 130*  K 4.0 3.9 3.1* 3.0*  CL 80* 83* 78* 80*  CO2 31 34* 40* 38*  GLUCOSE 62* 83 61* 103*  BUN 14 15 10 10   CREATININE 0.70 0.61 0.83 0.82  CALCIUM 9.0 8.6* 8.6* 9.1  OZH:YQMVHQION Creatinine Clearance: 34.7 mL/min (by C-G formula based on SCr of 0.82 mg/dL). Liver Function Tests: Recent Labs  Lab 07/11/22 1503  AST 62*  ALT 51*  ALKPHOS 76  BILITOT 1.4*  PROT 6.7  ALBUMIN 3.5   Recent Labs  Lab 07/13/22 2207 07/14/22 0059 07/14/22 0431 07/14/22 0741 07/14/22 1155  GLUCAP 158* 96 80 87 162*   Recent Labs    07/11/22 1501  TSH 1.193   Sepsis Labs: Recent Labs  Lab 07/11/22 1633 07/11/22 2209  LATICACIDVEN 0.9 0.9    No results found for this or any previous visit (from the past 240 hour(s)).  Antimicrobials: Anti-infectives (From admission, onward)    None      Culture/Microbiology    Component Value Date/Time   SDES URINE, CLEAN CATCH 06/10/2022 1024   SPECREQUEST NONE 06/10/2022 1024   CULT (A) 06/10/2022 1024    <10,000 COLONIES/mL INSIGNIFICANT GROWTH Performed at [facility], [address], [place], [place] [address]    REPTSTATUS 06/11/2022 FINAL 06/10/2022 1024   Radiology Studies: CT CHEST WO CONTRAST  Result Date: 07/13/2022 CLINICAL DATA:  Pleural effusion, pericardial effusion. EXAM: CT CHEST WITHOUT CONTRAST TECHNIQUE: Multidetector CT imaging of the chest was performed following the standard protocol without IV contrast. RADIATION DOSE REDUCTION: This exam was performed according to the departmental dose-optimization program which includes automated exposure control, adjustment of the mA and/or kV according to patient size and/or use of iterative reconstruction technique. COMPARISON:  Chest radiograph 07/11/2022, CTA chest 05/10/2022 FINDINGS: Cardiovascular: The heart is enlarged. Coronary artery calcifications, aortic valve calcifications, and  calcified plaque in the thoracic aorta are noted. There is no pericardial effusion. Mediastinum/Nodes: The thyroid is unremarkable. The esophagus is patulous with layering fluid. There is no mediastinal, hilar, or axillary lymphadenopathy. Lungs/Pleura: The trachea and central airways are patent. The moderate-sized right and small left pleural effusion are increased in size since 05/09/2022, with increased adjacent atelectasis in both lower lobes. Previously seen nodular ground-glass opacities have overall improved. There is no pneumothorax. Upper Abdomen: The imaged portions of the upper abdominal viscera are unremarkable. Musculoskeletal: There is no acute osseous abnormality or suspicious osseous lesion. IMPRESSION: 1. Moderate-sized right and small left pleural effusions have increased in size since 05/09/2022, with increased adjacent atelectasis in both lower lobes. 2. Previously seen nodular ground-glass opacities have overall improved which may reflect improved pneumonia or aspiration. 3. Patulous esophagus with layering fluid. Electronically Signed   By: Lesia Hausen M.D.   On: 07/13/2022 14:08  LOS: 3 days   Zannie Cove, MD [facility]  07/14/2022, 1:59 PM

## 2022-07-14 NOTE — Progress Notes (Signed)
Occupational Therapy Treatment Patient Details Name: Sandy Trujillo MRN: [id number] DOB: [birth year] Today's Date: 07/14/2022   History of present illness Pt is an 89 y/o female admitted secondary to AMS and bilateral LE edema. Pt found to have acute onset CHF and metabolic encephalopathy. PMH including but not limited to GERD, hyperparathyroidism, essential hypertension, history of ileostomy, osteoporosis, history of rectal cancer, history of anemia and atrial fibrillation on treatment.   OT comments  Pt with improved attention and processing speed this visit. Conversing appropriately. Overall, reports feeling better. Assisted to EOB and completed 3 grooming activities with set up to min assist, demonstrating fair sitting balance. Stood with min assist and RW and side stepped along EOB. VSS on 2L O2. Continue to recommend post acute rehab, although pt's preference is for home.    Recommendations for follow up therapy are one component of a multi-disciplinary discharge planning process, led by the attending physician.  Recommendations may be updated based on patient status, additional functional criteria and insurance authorization.    Assistance Recommended at Discharge Frequent or constant Supervision/Assistance  Patient can return home with the following  A little help with walking and/or transfers;A lot of help with bathing/dressing/bathroom;Assistance with cooking/housework;Direct supervision/assist for medications management;Assist for transportation;Help with stairs or ramp for entrance   Equipment Recommendations  None recommended by OT    Recommendations for Other Services      Precautions / Restrictions Precautions Precautions: Fall Restrictions Weight Bearing Restrictions: No       Mobility Bed Mobility Overal bed mobility: Needs Assistance Bed Mobility: Supine to Sit, Sit to Supine     Supine to sit: Mod assist Sit to supine: Mod assist   General bed mobility  comments: pt initiating LEs to EOB, assist for hips to EOB with bed pad and to raise trunk, assist for LEs back into bed, increased time    Transfers Overall transfer level: Needs assistance Equipment used: Rolling walker (2 wheels) Transfers: Sit to/from Stand Sit to Stand: Min assist           General transfer comment: assist to rise and steady, pt took several side steps along EOB, reports having been up to chair this morning     Balance Overall balance assessment: Needs assistance   Sitting balance-Leahy Scale: Fair     Standing balance support: During functional activity, Bilateral upper extremity supported Standing balance-Leahy Scale: Poor                             ADL either performed or assessed with clinical judgement   ADL Overall ADL's : Needs assistance/impaired     Grooming: Wash/dry hands;Wash/dry face;Oral care;Brushing hair;Sitting;Minimal assistance                                      Extremity/Trunk Assessment              Vision       Perception     Praxis      Cognition Arousal/Alertness: Awake/alert Behavior During Therapy: WFL for tasks assessed/performed Overall Cognitive Status: Impaired/Different from baseline Area of Impairment: Following commands, Safety/judgement, Awareness, Problem solving                       Following Commands: Follows one step commands with increased time Safety/Judgement: Decreased awareness of safety, Decreased awareness of  deficits Awareness: Intellectual Problem Solving: Decreased initiation, Requires verbal cues General Comments: improved processing speed        Exercises      Shoulder Instructions       General Comments      Pertinent Vitals/ Pain       Pain Assessment Pain Assessment: Faces Faces Pain Scale: Hurts a little bit Pain Location: generalized Pain Descriptors / Indicators: Moaning Pain Intervention(s): Monitored during session,  Repositioned  Home Living                                          Prior Functioning/Environment              Frequency  Min 1X/week        Progress Toward Goals  OT Goals(current goals can now be found in the care plan section)  Progress towards OT goals: Progressing toward goals  Acute Rehab OT Goals OT Goal Formulation: With patient/family Time For Goal Achievement: 08/02/22 Potential to Achieve Goals: Good  Plan Discharge plan remains appropriate    Co-evaluation                 AM-PAC OT "6 Clicks" Daily Activity     Outcome Measure   Help from another person eating meals?: A Little Help from another person taking care of personal grooming?: A Little Help from another person toileting, which includes using toliet, bedpan, or urinal?: A Little Help from another person bathing (including washing, rinsing, drying)?: A Lot Help from another person to put on and taking off regular upper body clothing?: A Little Help from another person to put on and taking off regular lower body clothing?: A Lot 6 Click Score: 16    End of Session Equipment Utilized During Treatment: Rolling walker (2 wheels);Oxygen (2L)  OT Visit Diagnosis: Unsteadiness on feet (R26.81);Muscle weakness (generalized) (M62.81);Other symptoms and signs involving cognitive function   Activity Tolerance Patient tolerated treatment well   Patient Left in bed;with call bell/phone within reach;with bed alarm set   Nurse Communication          Time: 1610-9604 OT Time Calculation (min): 27 min  Charges: OT General Charges $OT Visit: 1 Visit OT Treatments $Self Care/Home Management : 23-37 mins  Berna Spare, OTR/L Acute Rehabilitation Services Office: [phone]   Evern Bio 07/14/2022, 3:43 PM

## 2022-07-14 NOTE — Progress Notes (Signed)
[facility] Hospital Liaison Note  Notified by TOC manager of patient/family request for [facility] palliative services at home after discharge.   [facility] hospital liaison will follow patient for discharge disposition.   Please call with any hospice or outpatient palliative care related questions.   Thank you for the opportunity to participate in this patient's care.   Shanita Wicker, LCSW [facility] Hospital Liaison [phone]  

## 2022-07-15 DIAGNOSIS — I5031 Acute diastolic (congestive) heart failure: Secondary | ICD-10-CM | POA: Diagnosis not present

## 2022-07-15 DIAGNOSIS — G9341 Metabolic encephalopathy: Secondary | ICD-10-CM | POA: Diagnosis not present

## 2022-07-15 LAB — GLUCOSE, CAPILLARY
Glucose-Capillary: 104 mg/dL — ABNORMAL HIGH (ref 70–99)
Glucose-Capillary: 91 mg/dL (ref 70–99)
Glucose-Capillary: 101 mg/dL — ABNORMAL HIGH (ref 70–99)
Glucose-Capillary: 168 mg/dL — ABNORMAL HIGH (ref 70–99)
Glucose-Capillary: 157 mg/dL — ABNORMAL HIGH (ref 70–99)
Glucose-Capillary: 168 mg/dL — ABNORMAL HIGH (ref 70–99)

## 2022-07-15 LAB — BASIC METABOLIC PANEL
Anion gap: 16 — ABNORMAL HIGH (ref 5–15)
BUN: 18 mg/dL (ref 8–23)
CO2: 33 mmol/L — ABNORMAL HIGH (ref 22–32)
Calcium: 9.7 mg/dL (ref 8.9–10.3)
Chloride: 85 mmol/L — ABNORMAL LOW (ref 98–111)
Creatinine, Ser: 0.89 mg/dL (ref 0.44–1.00)
GFR, Estimated: >60 mL/min (ref >60)
Glucose, Bld: 101 mg/dL — ABNORMAL HIGH (ref 70–99)
Potassium: 2.3 mmol/L — CL (ref 3.5–5.1)
Sodium: 134 mmol/L — ABNORMAL LOW (ref 135–145)

## 2022-07-15 LAB — CBC
HCT: 35.9 % — ABNORMAL LOW (ref 36.0–46.0)
Hemoglobin: 11.4 g/dL — ABNORMAL LOW (ref 12.0–15.0)
MCH: 26.6 pg (ref 26.0–34.0)
MCHC: 31.8 g/dL (ref 30.0–36.0)
MCV: 83.7 fL (ref 80.0–100.0)
Platelets: 127 10*3/uL — ABNORMAL LOW (ref 150–400)
RBC: 4.29 MIL/uL (ref 3.87–5.11)
RDW: 15.7 % — ABNORMAL HIGH (ref 11.5–15.5)
WBC: 12.3 10*3/uL — ABNORMAL HIGH (ref 4.0–10.5)
nRBC: 0 % (ref 0.0–0.2)

## 2022-07-15 LAB — AMMONIA: Ammonia: 33 umol/L (ref 9–35)

## 2022-07-15 MED ORDER — POTASSIUM CHLORIDE CRYS ER 20 MEQ PO TBCR
40.0000 meq | EXTENDED_RELEASE_TABLET | ORAL | Status: DC
  Administered 2022-07-15: 40 meq via ORAL
  Filled 2022-07-15: qty 2

## 2022-07-15 MED ORDER — LIDOCAINE 5 % EX PTCH
1.0000 | MEDICATED_PATCH | CUTANEOUS | Status: DC
  Administered 2022-07-15 – 2022-07-17 (×3): 1 via TRANSDERMAL
  Filled 2022-07-15 (×3): qty 1

## 2022-07-15 MED ORDER — FUROSEMIDE 40 MG PO TABS
40.0000 mg | ORAL_TABLET | Freq: Every day | ORAL | Status: DC
  Administered 2022-07-15 – 2022-07-17 (×3): 40 mg via ORAL
  Filled 2022-07-15 (×3): qty 1

## 2022-07-15 MED ORDER — LISINOPRIL 20 MG PO TABS
20.0000 mg | ORAL_TABLET | Freq: Every day | ORAL | Status: DC
  Administered 2022-07-15 – 2022-07-17 (×3): 20 mg via ORAL
  Filled 2022-07-15 (×3): qty 1

## 2022-07-15 MED ORDER — POTASSIUM CHLORIDE CRYS ER 20 MEQ PO TBCR
40.0000 meq | EXTENDED_RELEASE_TABLET | ORAL | Status: AC
  Administered 2022-07-15: 40 meq via ORAL
  Filled 2022-07-15: qty 2

## 2022-07-15 MED ORDER — POTASSIUM CHLORIDE 10 MEQ/100ML IV SOLN
10.0000 meq | INTRAVENOUS | Status: AC
  Administered 2022-07-15 (×2): 10 meq via INTRAVENOUS
  Filled 2022-07-15 (×2): qty 100

## 2022-07-15 MED ORDER — POTASSIUM CHLORIDE CRYS ER 20 MEQ PO TBCR
40.0000 meq | EXTENDED_RELEASE_TABLET | Freq: Once | ORAL | Status: AC
  Administered 2022-07-15: 40 meq via ORAL
  Filled 2022-07-15: qty 2

## 2022-07-15 NOTE — Progress Notes (Signed)
Daily Progress Note   Patient Name: Sandy Trujillo       Date: 07/15/2022 DOB: Sep 16, 1932  Age: 89 y.o. MRN#: [id number] Attending Physician: Zannie Cove, MD Primary Care Physician: Thana Ates, MD Admit Date: 07/11/2022  Reason for Consultation/Follow-up: Establishing goals of care  Subjective: Medical records reviewed including progress notes, labs, and imaging. Patient assessed at the bedside. She is very lethargic and difficult to arouse. Her son is present at the bedside and daughter Sandy Trujillo was in the hallways.   Sandy Trujillo was understandably emotional reviewing patient's decline today and how difficult it is to wake her up. She also shares that family has proceeded with decision for DNR/DNI given that patient does not have the ability to make her own informed decisions. She would like for me to speak with her brother about hospice further, as they are worried she may need their support immediately upon discharge rather than after a trial of palliative care. Emotional support and therapeutic listening was provided.  I then met with patient's son at the bedside. We discussed her rally yesterday and signs that patient may be transitioning to end-of-life sooner than anticipated. He confirms that he and his sister have been wondering about hospice rather than palliative for additional support and equipment at home. Counseled on the available resources at home and also discussed residential hospice as an option depending on patient's wishes and family's ability to coordinate 24/7 care. Son verbalized his understanding and agreement with notifying [facility] of desire for hospice.  Also reviewed comfort care as an option while coordinating hospice at home. Reviewed that patient would no longer receive  aggressive medical interventions such as continuous vital signs, lab work, radiology testing, or medications not focused on comfort. All care would focus on how the patient is looking and feeling. This would include management of any symptoms that may cause discomfort, pain, shortness of breath, cough, nausea, agitation, anxiety, and/or secretions etc. Symptoms would be managed with medications and other non-pharmacological interventions such as spiritual support if requested, repositioning, music therapy, or therapeutic listening. Sandy Trujillo verbalized understanding and appreciation. They will continue discussing this before making a decision.   Questions and concerns addressed. PMT will continue to support holistically.   Length of Stay: 4   Physical Exam Vitals and nursing note reviewed.  Constitutional:  General: She is sleeping. She is not in acute distress.    Appearance: She is ill-appearing.     Interventions: Nasal cannula in place.  Cardiovascular:     Rate and Rhythm: Normal rate. Rhythm irregular.  Pulmonary:     Effort: Pulmonary effort is normal.  Neurological:     Mental Status: She is unresponsive.            Vital Signs: BP (!) 155/79 (BP Location: Right Arm)   Pulse 70   Temp 97.9 F (36.6 C) (Oral)   Resp 17   Ht 5' (1.524 m)   Wt 52.2 kg   SpO2 100%   BMI 22.48 kg/m  SpO2: SpO2: 100 % O2 Device: O2 Device: Nasal Cannula O2 Flow Rate: O2 Flow Rate (L/min): 2 L/min      Palliative Assessment/Data: 50%   Palliative Care Assessment & Plan   Patient Profile: 89 y.o. female  with past medical history of GERD, hyperparathyroidism, essential hypertension, HLD, history of ileostomy, osteoporosis, history of rectal cancer, history of anemia and atrial fibrillation, ulcerative colitis, history of GI bleed admitted on 07/11/2022 with altered mental status.    Patient was diagnosed with new onset atrial fibrillation in April 2024. She was treated with Eliquis and  underwent successful cardioversion in May. Patient states that she has had fatigue and her symptoms did not improve following cardioversion. 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 She also has had difficulties with bradycardia and new onset systolic CHF.    Echocardiogram this admission shows ejection fraction 40 to 45% which is decreased compared to previous, moderate RV dysfunction, mild right ventricular enlargement, severe pulmonary hypertension, severe biatrial enlargement, moderate to large pericardial effusion with mild diastolic collapse of the right atrium, large left pleural effusion; concern for early tamponade.    PMT has been consulted to assist with goals of care conversation.  Assessment: Goals of care conversation Afib, rate controlled Acute diastolic congestive heart failure  Acute metabolic encephalopathy, improved Failure to thrive  Recommendations/Plan: Code status changed to DNR/DNI Patient's children are now interested in hospice at home rather than outpatient palliative care Ongoing discussions regarding comfort care while inpatient and coordinating DME delivery Psychosocial and emotional support provided PMT will continue to follow and support   Prognosis:  Poor prognosis with rapid decline, appropriate for hospice  Discharge Planning: Home with Hospice  Care plan was discussed with patient's 2 children, MD, [facility], [facility] hospital liaison    MD[facility]         Marvelene Stoneberg Jeni Salles, PA-C  Palliative Medicine Team Team phone # [phone]  Thank you for allowing the Palliative Medicine Team to assist in the care of this patient. Please utilize secure chat with additional questions, if there is no response within 30 minutes please call the above phone number.  Palliative Medicine Team providers are available by phone from 7am to 7pm daily and can be reached through the team cell phone.   Should this patient require assistance outside of these hours, please call the patient's attending physician.

## 2022-07-15 NOTE — Progress Notes (Signed)
Physical Therapy Treatment Patient Details Name: Sandy Trujillo MRN: [id number] DOB: [birth year] Today's Date: 07/15/2022   History of Present Illness Pt is an 89 y/o female admitted secondary to AMS and bilateral LE edema. Pt found to have acute onset CHF and metabolic encephalopathy. PMH including but not limited to GERD, hyperparathyroidism, essential hypertension, history of ileostomy, osteoporosis, history of rectal cancer, history of anemia and atrial fibrillation on treatment.    PT Comments    Pt weaker than with last visit. Requiring more assist for mobility and fatigues quickly. Assisted with transfers and standing but too weak to ambulate today. Family plans to take pt home with hospice.    Recommendations for follow up therapy are one component of a multi-disciplinary discharge planning process, led by the attending physician.  Recommendations may be updated based on patient status, additional functional criteria and insurance authorization.  Follow Up Recommendations       Assistance Recommended at Discharge Frequent or constant Supervision/Assistance  Patient can return home with the following A lot of help with walking and/or transfers;A lot of help with bathing/dressing/bathroom;Assistance with cooking/housework;Help with stairs or ramp for entrance;Assist for transportation   Equipment Recommendations  Rolling walker (2 wheels);Hospital bed    Recommendations for Other Services       Precautions / Restrictions Precautions Precautions: Fall Restrictions Weight Bearing Restrictions: No     Mobility  Bed Mobility Overal bed mobility: Needs Assistance Bed Mobility: Supine to Sit     Supine to sit: Max assist     General bed mobility comments: Assist to move LE's off of bed, elevate trunk into sitting and bring hips to EOB.    Transfers Overall transfer level: Needs assistance Equipment used: Rolling walker (2 wheels) Transfers: Sit to/from Stand, Bed to  chair/wheelchair/BSC Sit to Stand: Mod assist   Step pivot transfers: Max assist       General transfer comment: Assist to bring hips up with pt leaning heavily posteriorly. Used bilateral shelf arm support for transfer bed to bsc to bed to recliner. Stood from Medical illustrator with walker to work on standing balance.    Ambulation/Gait             Pre-gait activities: Stood x 2 with and without walker to work on getting weight over feet to correct posterior lean General Gait Details: Unable due to fatigue and posterior lean   Stairs             Wheelchair Mobility    Modified Rankin (Stroke Patients Only)       Balance Overall balance assessment: Needs assistance Sitting-balance support: Feet supported, Bilateral upper extremity supported Sitting balance-Leahy Scale: Poor Sitting balance - Comments: UE support   Standing balance support: During functional activity, Bilateral upper extremity supported Standing balance-Leahy Scale: Poor Standing balance comment: BUE support and min to mod assist for static standing                            Cognition Arousal/Alertness: Awake/alert Behavior During Therapy: Anxious Overall Cognitive Status: Impaired/Different from baseline Area of Impairment: Following commands, Safety/judgement, Awareness, Problem solving                       Following Commands: Follows one step commands with increased time Safety/Judgement: Decreased awareness of safety, Decreased awareness of deficits Awareness: Intellectual Problem Solving: Decreased initiation, Requires verbal cues, Slow processing  Exercises      General Comments General comments (skin integrity, edema, etc.): Pt on O2      Pertinent Vitals/Pain Pain Assessment Pain Assessment: Faces Faces Pain Scale: No hurt    Home Living                          Prior Function            PT Goals (current goals can now be found in  the care plan section) Progress towards PT goals: Not progressing toward goals - comment (fatigue)    Frequency    Min 1X/week      PT Plan Discharge plan needs to be updated    Co-evaluation              AM-PAC PT "6 Clicks" Mobility   Outcome Measure  Help needed turning from your back to your side while in a flat bed without using bedrails?: A Lot Help needed moving from lying on your back to sitting on the side of a flat bed without using bedrails?: A Lot Help needed moving to and from a bed to a chair (including a wheelchair)?: A Lot Help needed standing up from a chair using your arms (e.g., wheelchair or bedside chair)?: A Lot Help needed to walk in hospital room?: Total Help needed climbing 3-5 steps with a railing? : Total 6 Click Score: 10    End of Session Equipment Utilized During Treatment: Gait belt;Oxygen Activity Tolerance: Patient limited by fatigue Patient left: in chair;with call bell/phone within reach;with family/visitor present Nurse Communication: Mobility status;Need for lift equipment (Possible) PT Visit Diagnosis: Other abnormalities of gait and mobility (R26.89)     Time: 1610-9604 PT Time Calculation (min) (ACUTE ONLY): 35 min  Charges:  $Therapeutic Activity: 23-37 mins                     [facility] PT Acute Rehabilitation Services Office [phone]    Angelina Ok [facility] 07/15/2022, 2:14 PM

## 2022-07-15 NOTE — TOC Progression Note (Signed)
Transition of Care [facility]) - Progression Note    Patient Details  Name: Sandy Trujillo MRN: [id number] Date of Birth: 1932/04/14  Transition of [facility] CM/SW Contact  Graves-Bigelow, Lamar Laundry, RN Phone Number: 07/15/2022, 12:17 PM  Clinical Narrative:  Family has decided for full comfort and to transition home with hospice services. [facility] will be following the patient for Hospice Services- DME to be delivered. Patient will most likely need PTAR for transport. Case Manager will continue to follow for transition of care needs.   Expected Discharge Plan: Home w Hospice Care Barriers to Discharge: No Barriers Identified  Expected Discharge Plan and Services In-house Referral: NA Discharge Planning Services: CM Consult Post Acute Care Choice: Home Health, Resumption of Svcs/PTA Provider Living arrangements for the past 2 months: Single Family Home                  DME Agency: NA    HH Arranged: RN HH Agency: [facility] Date HH Agency Contacted: 07/15/22 Time HH Agency Contacted: 1217 Representative spoke with at [facility] Agency: shanita   Social Determinants of Health (SDOH) Interventions SDOH Screenings   Food Insecurity: No Food Insecurity (07/11/2022)  Housing: Patient Unable To Answer (07/11/2022)  Transportation Needs: No Transportation Needs (07/11/2022)  Utilities: Not At Risk (07/11/2022)  Alcohol Screen: Low Risk  (07/11/2022)  Depression (PHQ2-9): Low Risk  (07/11/2022)  Financial Resource Strain: Low Risk  (07/11/2022)  Physical Activity: Insufficiently Active (07/11/2022)  Stress: No Stress Concern Present (07/11/2022)  Tobacco Use: Low Risk  (07/11/2022)    Readmission Risk Interventions     No data to display

## 2022-07-15 NOTE — Progress Notes (Signed)
Patient potassium is 2.3

## 2022-07-15 NOTE — Progress Notes (Signed)
PROGRESS NOTE Sandy Trujillo  ZOX:[id number] DOB: 1932/11/30 DOA: 07/11/2022 PCP: Thana Ates, MD  Brief Narrative/Hospital Course: 89/F with history of ulcerative colitis, total colectomy, ileostomy, chronic diastolic CHF was diagnosed with A-fib in 4/24, underwent successful cardioversion in May subsequently diagnosed with anemia and small bowel AVM requiring blood transfusions. -Admitted with functional decline, worsening edema and worsening mental status.  Noted to have worsening cardiomyopathy, volume overload, hyponatremia, EF down to 40-45% moderate RV dysfunction and severe pulmonary hypertension -Cards and palliative care following, conservative management   Subjective: Drowsy this morning  Assessment and Plan:  New onset systolic CHF EF 40-45%  Moderately reduced RV systolic function, severely elevated pulmonary artery systolic pressure Large pleural effusion on the left: -Cards following, diuresed with IV Lasix, improving, weight down 17 LB -Changing to oral diuretics, continue Aldactone -Conservative management with advanced age and frailty, palliative care following, family is considering hospice  Moderate pericardial effusion -Clinically without evidence of tamponade, improving with diuretics -Continue current diuretics, repeat echo tomorrow -Conservative management  Persistent atrial fibrillation recentcardioversion 06/23/2021 but failed,  -Resumed Eliquis  Acute encephalopathy -Initially related to hyponatremia from hypervolemia which has been improving, sodium up to 134 now -Somnolent and difficult to arouse this morning -Check ammonia level, urinalysis, will reassess in few hours  Hypoglycemia: Not on OHA and no known history of diabetes. -Now resolved, monitor  Hypervolemia hyponatremia:  -Improving with diuresis  Hypokalemia  -Severe, will replete, check mag level  History of ulcerative colitis, total colectomy -Has ileostomy  Goals of care/CODE  STATUS: Multiple comorbidities and advanced age, frailty, palliative following  DVT prophylaxis: Apixaban Code Status:   Code Status: DNR Family Communication: Discussed with family at bedside Dispo, now plan for home with hospice services    Objective: Vitals last 24 hrs: Vitals:   07/14/22 1620 07/14/22 2100 07/15/22 0353 07/15/22 1100  BP: (!) 153/66 (!) 168/76 (!) 155/79 (!) 133/54  Pulse: 75 64 70   Resp: 20 17 17    Temp: 98 F (36.7 C) 98.3 F (36.8 C) 97.9 F (36.6 C)   TempSrc: Oral Oral Oral   SpO2: 94% 100%    Weight:   52.2 kg   Height:       Weight change: -1.4 kg  Physical Examination:  Somnolent, difficult to arouse HEENT: Positive JVD CVS: S1-S2, irregular rhythm Lungs: Diminished breath sounds Abdomen: Soft, nontender, bowel sounds Extremities: Trace edema  Medications reviewed:  Scheduled Meds:  acetaZOLAMIDE  250 mg Oral BID   apixaban  2.5 mg Oral BID   feeding supplement  237 mL Oral BID BM   furosemide  40 mg Oral Daily   lidocaine  1 patch Transdermal Q24H   lisinopril  20 mg Oral Daily   loratadine  10 mg Oral Daily   multivitamin with minerals  1 tablet Oral Daily   nystatin   Topical TID   pantoprazole  40 mg Oral Daily   potassium chloride  40 mEq Oral Q4H   sodium chloride flush  3 mL Intravenous Q12H   spironolactone  12.5 mg Oral Daily   Continuous Infusions:  sodium chloride        Diet Order             Diet regular Room service appropriate? Yes; Fluid consistency: Thin  Diet effective now                   Intake/Output Summary (Last 24 hours) at 07/15/2022 1136 Last data filed  at 07/15/2022 0720 Gross per 24 hour  Intake 120 ml  Output 1350 ml  Net -1230 ml   Net IO Since Admission: -5,740 mL [07/15/22 1136]  Wt Readings from Last 3 Encounters:  07/15/22 52.2 kg  06/28/22 59.6 kg  06/22/22 58.6 kg     Unresulted Labs (From admission, onward)     Start     Ordered   07/16/22 0500  CBC  Tomorrow morning,    R       Question:  Specimen collection method  Answer:  Lab=Lab collect   07/15/22 0810   07/16/22 0500  Comprehensive metabolic panel  Tomorrow morning,   R       Question:  Specimen collection method  Answer:  Lab=Lab collect   07/15/22 0810   07/15/22 0810  Urinalysis, Routine w reflex microscopic -Urine, Clean Catch  Once,   R       Question:  Specimen Source  Answer:  Urine, Clean Catch   07/15/22 0810          Data Reviewed: I have personally reviewed following labs and imaging studies CBC: Recent Labs  Lab 07/11/22 1503 07/14/22 0035 07/15/22 0220  WBC 5.1 8.5 12.3*  HGB 11.3* 11.4* 11.4*  HCT 36.0 35.8* 35.9*  MCV 87.2 84.6 83.7  PLT 174 144* 127*   Basic Metabolic Panel: Recent Labs  Lab 07/11/22 1503 07/12/22 0217 07/13/22 0233 07/14/22 0035 07/15/22 0220  NA 123* 126* 129* 130* 134*  K 4.0 3.9 3.1* 3.0* 2.3*  CL 80* 83* 78* 80* 85*  CO2 31 34* 40* 38* 33*  GLUCOSE 62* 83 61* 103* 101*  BUN 14 15 10 10 18   CREATININE 0.70 0.61 0.83 0.82 0.89  CALCIUM 9.0 8.6* 8.6* 9.1 9.7  ZOX:[id number] Creatinine Clearance: 32 mL/min (by C-G formula based on SCr of 0.89 mg/dL). Liver Function Tests: Recent Labs  Lab 07/11/22 1503  AST 62*  ALT 51*  ALKPHOS 76  BILITOT 1.4*  PROT 6.7  ALBUMIN 3.5   Recent Labs  Lab 07/14/22 1600 07/14/22 2222 07/15/22 0202 07/15/22 0454 07/15/22 0751  GLUCAP 162* 132* 104* 91 101*   No results for input(s): "TSH", "T4TOTAL", "FREET4", "T3FREE", "THYROIDAB" in the last 72 hours.  Sepsis Labs: Recent Labs  Lab 07/11/22 1633 07/11/22 2209  LATICACIDVEN 0.9 0.9    No results found for this or any previous visit (from the past 240 hour(s)).  Antimicrobials: Anti-infectives (From admission, onward)    None      Culture/Microbiology    Component Value Date/Time   SDES URINE, CLEAN CATCH 06/10/2022 1024   SPECREQUEST NONE 06/10/2022 1024   CULT (A) 06/10/2022 1024    <10,000 COLONIES/mL INSIGNIFICANT  GROWTH Performed at [facility], [address] [place], [place] [address]    REPTSTATUS 06/11/2022 FINAL 06/10/2022 1024   Radiology Studies: No results found.   LOS: 4 days   Zannie Cove, MD [facility]  07/15/2022, 11:36 AM

## 2022-07-15 NOTE — Progress Notes (Signed)
On assessment patient lethargic, after verbal stemuli she opened eyes, face washed with cool water.  Will assess arousal on next round for AM medication administration.

## 2022-07-15 NOTE — Progress Notes (Signed)
Heart Failure Navigator Progress Note  Assessed for Heart & Vascular TOC clinic readiness.  Patient does not meet criteria due to EF 40-45%, Plan for patient to go home with Hospice. .   Navigator will sign off at this time.    Rhae Hammock, BSN, Scientist, clinical (histocompatibility and immunogenetics) Only

## 2022-07-15 NOTE — Care Management (Cosign Needed)
    Durable Medical Equipment  (From admission, onward)           Start     Ordered   07/14/22 1706  For home use only DME Walker rolling  Once       Question Answer Comment  Walker: With 5 Inch Wheels   Patient needs a walker to treat with the following condition General weakness   Patient needs a walker to treat with the following condition Acute metabolic encephalopathy      07/14/22 1706

## 2022-07-15 NOTE — Progress Notes (Signed)
Rounding Note    Patient Name: Sandy Trujillo Date of Encounter: 07/15/2022  [facility] Cardiologist: Reatha Harps, MD   Subjective   Somnolent; does not respond to questions this morning.  Inpatient Medications    Scheduled Meds:  acetaZOLAMIDE  250 mg Oral BID   apixaban  2.5 mg Oral BID   feeding supplement  237 mL Oral BID BM   furosemide  40 mg Intravenous Daily   lidocaine  1 patch Transdermal Q24H   lisinopril  10 mg Oral Daily   loratadine  10 mg Oral Daily   multivitamin with minerals  1 tablet Oral Daily   nystatin   Topical TID   pantoprazole  40 mg Oral Daily   potassium chloride  40 mEq Oral Q4H   sodium chloride flush  3 mL Intravenous Q12H   spironolactone  12.5 mg Oral Daily   Continuous Infusions:  sodium chloride     PRN Meds: sodium chloride, acetaminophen, ALPRAZolam, dextrose, ondansetron (ZOFRAN) IV, sodium chloride flush   Vital Signs    Vitals:   07/14/22 1209 07/14/22 1620 07/14/22 2100 07/15/22 0353  BP: (!) 128/55 (!) 153/66 (!) 168/76 (!) 155/79  Pulse:  75 64 70  Resp:  20 17 17   Temp: 97.9 F (36.6 C) 98 F (36.7 C) 98.3 F (36.8 C) 97.9 F (36.6 C)  TempSrc: Oral Oral Oral Oral  SpO2:  94% 100%   Weight:    52.2 kg  Height:        Intake/Output Summary (Last 24 hours) at 07/15/2022 0759 Last data filed at 07/15/2022 0720 Gross per 24 hour  Intake 360 ml  Output 1350 ml  Net -990 ml       07/15/2022    3:53 AM 07/14/2022    5:00 AM 07/13/2022    4:08 AM  Last 3 Weights  Weight (lbs) 115 lb 1.3 oz 118 lb 2.7 oz 126 lb 5.2 oz  Weight (kg) 52.2 kg 53.6 kg 57.3 kg      Telemetry    Atrial fibrillation rate controlled - Personally Reviewed  Physical Exam   GEN: NAD Neck: supple Cardiac: irrregular, no rub Respiratory: Diminished breath sounds bases; no wheeze GI: Soft, NT/ND MS: trace edema Neuro: Somnolent. Psych: Not assessed as she does not answer questions.  Labs   Chemistry Recent  Labs  Lab 07/11/22 1503 07/12/22 0217 07/13/22 0233 07/14/22 0035 07/15/22 0220  NA 123*   < > 129* 130* 134*  K 4.0   < > 3.1* 3.0* 2.3*  CL 80*   < > 78* 80* 85*  CO2 31   < > 40* 38* 33*  GLUCOSE 62*   < > 61* 103* 101*  BUN 14   < > 10 10 18   CREATININE 0.70   < > 0.83 0.82 0.89  CALCIUM 9.0   < > 8.6* 9.1 9.7  PROT 6.7  --   --   --   --   ALBUMIN 3.5  --   --   --   --   AST 62*  --   --   --   --   ALT 51*  --   --   --   --   ALKPHOS 76  --   --   --   --   BILITOT 1.4*  --   --   --   --   GFRNONAA >60   < > >60 >60 >60  ANIONGAP 12   < > 11 12 16*   < > = values in this interval not displayed.    Hematology Recent Labs  Lab 07/11/22 1503 07/14/22 0035 07/15/22 0220  WBC 5.1 8.5 12.3*  RBC 4.13 4.23 4.29  HGB 11.3* 11.4* 11.4*  HCT 36.0 35.8* 35.9*  MCV 87.2 84.6 83.7  MCH 27.4 27.0 26.6  MCHC 31.4 31.8 31.8  RDW 15.9* 15.8* 15.7*  PLT 174 144* 127*    Thyroid  Recent Labs  Lab 07/11/22 1501  TSH 1.193     BNP Recent Labs  Lab 07/11/22 1501  BNP 664.9*      Radiology    CT CHEST WO CONTRAST  Result Date: 07/13/2022 CLINICAL DATA:  Pleural effusion, pericardial effusion. EXAM: CT CHEST WITHOUT CONTRAST TECHNIQUE: Multidetector CT imaging of the chest was performed following the standard protocol without IV contrast. RADIATION DOSE REDUCTION: This exam was performed according to the departmental dose-optimization program which includes automated exposure control, adjustment of the mA and/or kV according to patient size and/or use of iterative reconstruction technique. COMPARISON:  Chest radiograph 07/11/2022, CTA chest 05/10/2022 FINDINGS: Cardiovascular: The heart is enlarged. Coronary artery calcifications, aortic valve calcifications, and calcified plaque in the thoracic aorta are noted. There is no pericardial effusion. Mediastinum/Nodes: The thyroid is unremarkable. The esophagus is patulous with layering fluid. There is no mediastinal, hilar,  or axillary lymphadenopathy. Lungs/Pleura: The trachea and central airways are patent. The moderate-sized right and small left pleural effusion are increased in size since 05/09/2022, with increased adjacent atelectasis in both lower lobes. Previously seen nodular ground-glass opacities have overall improved. There is no pneumothorax. Upper Abdomen: The imaged portions of the upper abdominal viscera are unremarkable. Musculoskeletal: There is no acute osseous abnormality or suspicious osseous lesion. IMPRESSION: 1. Moderate-sized right and small left pleural effusions have increased in size since 05/09/2022, with increased adjacent atelectasis in both lower lobes. 2. Previously seen nodular ground-glass opacities have overall improved which may reflect improved pneumonia or aspiration. 3. Patulous esophagus with layering fluid. Electronically Signed   By: Lesia Hausen M.D.   On: 07/13/2022 14:08     Patient Profile     89 year old female with past medical history of persistent atrial fibrillation, hypertension, hyperlipidemia, history of rectal carcinoma status post colectomy, ulcerative colitis, history of GI bleed for evaluation of acute diastolic congestive heart failure. Patient diagnosed with new onset atrial fibrillation in April 2024. She was treated with Eliquis and underwent successful cardioversion in May. Patient states that she has had fatigue and her symptoms did not improve following cardioversion. 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 She also has had difficulties with bradycardia. She was seen by Dr. Nelly Laurence and pacemaker not felt indicated at this point. Monitor showed atrial fibrillation with heart rate between 39 and 93 with average 65. She was readmitted with complaints of new onset lower ext edema, decreased mental status and functional decline.   Echocardiogram this admission shows ejection fraction 40 to 45%  (I will need and feel it is low normal) which is decreased compared to previous, moderate RV dysfunction, mild right ventricular enlargement, severe pulmonary hypertension, severe biatrial enlargement, moderate to large pericardial effusion with mild diastolic collapse of the right atrium, large left pleural effusion; concern for early tamponade.  Assessment & Plan    1 Pericardial effusion-as outlined previously I personally reviewed the patient's prior echocardiogram.  Pericardial  fusion appears to be small in most views but possible moderate effusion predominantly anteriorly in the subcostal views.  Follow-up noncontrast chest CT did not show pericardial effusion.  Blood pressure remains elevated and heart rate normal and she does not appear to show tamponade physiology.  We are also trying to be conservative given patient's age and overall medical condition.  Plan will be to continue gentle diuresis and repeat limited echocardiogram tomorrow to reassess pericardial effusion.  I am not convinced that pericardial effusion was contributing to her edema.   2 acute diastolic congestive heart failure-volume status has improved.  Change Lasix to 40 mg by mouth daily.  Decrease Lasix to 40 mg daily.  Continue acetazolamide for contraction alkalosis.  Continue low-dose spironolactone.  Follow renal function.   3 persistent atrial fibrillation-patient underwent recent cardioversion but atrial fibrillation recurred.  Her symptoms did not improve following cardioversion.  She was seen by Dr. Nelly Laurence and thought at present was to continue with rate control/anticoagulation.  She is requiring no AV nodal blocking agents.  He also felt that if rhythm control pursued she would require pacemaker.  Continue apixaban 2.5 mg twice daily.   4 declining mental status-I do not think this is related to atrial fibrillation or CHF.  He is somnolent this morning and does not answer questions.  Further evaluation per primary  care.   5 hyponatremia-sodium improving.  6 hypertension-blood pressure elevated.  Increase lisinopril to 20 mg daily and follow.  7 severe hypokalemia-potassium is being supplemented.  For questions or updates, please contact  [facility] Please consult [email or web address] for contact info under        Signed, Olga Millers, MD  07/15/2022, 7:59 AM

## 2022-07-15 NOTE — Progress Notes (Signed)
[employer], [employer]) Hospital Liaison Note  Referral received for patient/family interest in hospice at home. [facility] liaison spoke with patient's daughter [name] to confirm interest. Interest confirmed.   Plan is to discharge home once medically stable.   DME needed is hospital bed, over the bed table and oxygen. [facility] has ordered this.   Please send comfort medications/prescriptions at discharge with patient.   Please call with any questions or concerns. Thank you  Dionicio Stall, [facility] Liaison [phone]

## 2022-07-16 ENCOUNTER — Inpatient Hospital Stay (HOSPITAL_COMMUNITY): Payer: Medicare Other

## 2022-07-16 DIAGNOSIS — Z789 Other specified health status: Secondary | ICD-10-CM

## 2022-07-16 DIAGNOSIS — Z515 Encounter for palliative care: Secondary | ICD-10-CM

## 2022-07-16 DIAGNOSIS — Z66 Do not resuscitate: Secondary | ICD-10-CM

## 2022-07-16 DIAGNOSIS — G9341 Metabolic encephalopathy: Secondary | ICD-10-CM | POA: Diagnosis not present

## 2022-07-16 DIAGNOSIS — Z711 Person with feared health complaint in whom no diagnosis is made: Secondary | ICD-10-CM

## 2022-07-16 DIAGNOSIS — R4182 Altered mental status, unspecified: Secondary | ICD-10-CM

## 2022-07-16 DIAGNOSIS — I5031 Acute diastolic (congestive) heart failure: Secondary | ICD-10-CM | POA: Diagnosis not present

## 2022-07-16 DIAGNOSIS — R001 Bradycardia, unspecified: Secondary | ICD-10-CM

## 2022-07-16 DIAGNOSIS — I3139 Other pericardial effusion (noninflammatory): Secondary | ICD-10-CM

## 2022-07-16 DIAGNOSIS — R638 Other symptoms and signs concerning food and fluid intake: Secondary | ICD-10-CM

## 2022-07-16 LAB — GLUCOSE, CAPILLARY
Glucose-Capillary: 114 mg/dL — ABNORMAL HIGH (ref 70–99)
Glucose-Capillary: 142 mg/dL — ABNORMAL HIGH (ref 70–99)
Glucose-Capillary: 143 mg/dL — ABNORMAL HIGH (ref 70–99)
Glucose-Capillary: 120 mg/dL — ABNORMAL HIGH (ref 70–99)
Glucose-Capillary: 131 mg/dL — ABNORMAL HIGH (ref 70–99)
Glucose-Capillary: 185 mg/dL — ABNORMAL HIGH (ref 70–99)

## 2022-07-16 LAB — COMPREHENSIVE METABOLIC PANEL
ALT: 26 U/L (ref 0–44)
AST: 22 U/L (ref 15–41)
Albumin: 2.8 g/dL — ABNORMAL LOW (ref 3.5–5.0)
Alkaline Phosphatase: 60 U/L (ref 38–126)
Anion gap: 11 (ref 5–15)
BUN: 32 mg/dL — ABNORMAL HIGH (ref 8–23)
CO2: 29 mmol/L (ref 22–32)
Calcium: 9.9 mg/dL (ref 8.9–10.3)
Chloride: 97 mmol/L — ABNORMAL LOW (ref 98–111)
Creatinine, Ser: 0.92 mg/dL (ref 0.44–1.00)
GFR, Estimated: >60 mL/min (ref >60)
Glucose, Bld: 101 mg/dL — ABNORMAL HIGH (ref 70–99)
Potassium: 3.6 mmol/L (ref 3.5–5.1)
Sodium: 137 mmol/L (ref 135–145)
Total Bilirubin: 1 mg/dL (ref 0.3–1.2)
Total Protein: 6.3 g/dL — ABNORMAL LOW (ref 6.5–8.1)

## 2022-07-16 LAB — CBC
HCT: 35.2 % — ABNORMAL LOW (ref 36.0–46.0)
Hemoglobin: 11.1 g/dL — ABNORMAL LOW (ref 12.0–15.0)
MCH: 26.9 pg (ref 26.0–34.0)
MCHC: 31.5 g/dL (ref 30.0–36.0)
MCV: 85.4 fL (ref 80.0–100.0)
Platelets: 116 10*3/uL — ABNORMAL LOW (ref 150–400)
RBC: 4.12 MIL/uL (ref 3.87–5.11)
RDW: 15.6 % — ABNORMAL HIGH (ref 11.5–15.5)
WBC: 11.3 10*3/uL — ABNORMAL HIGH (ref 4.0–10.5)
nRBC: 0 % (ref 0.0–0.2)

## 2022-07-16 LAB — ECHOCARDIOGRAM LIMITED
Height: 60 in
Weight: 1802.48 oz

## 2022-07-16 NOTE — Progress Notes (Signed)
PROGRESS NOTE Sandy Trujillo  NWG:[id number] DOB: [birth year] DOA: 07/11/2022 PCP: Thana Ates, MD  Brief Narrative/Hospital Course: 89/F with history of ulcerative colitis, total colectomy, ileostomy, chronic diastolic CHF was diagnosed with A-fib in 4/24, underwent successful cardioversion in May subsequently diagnosed with anemia and small bowel AVM requiring blood transfusions. -Admitted with functional decline, worsening edema and worsening mental status.  Noted to have worsening cardiomyopathy, volume overload, hyponatremia, EF down to 40-45% moderate RV dysfunction and severe pulmonary hypertension -Cards and palliative care following, conservative management, family considering home with hospice   Subjective: More awake today  Assessment and Plan:  New onset systolic CHF EF 40-45%  Moderately reduced RV systolic function, severely elevated pulmonary artery systolic pressure Large pleural effusion on the left: -Cards following, diuresed with IV Lasix, improving, weight down 17 LB -Changed to oral diuretics, continue Aldactone -Conservative management with advanced age and frailty, palliative care following, family is considering hospice at discharge  Moderate pericardial effusion -Clinically without evidence of tamponade, improving with diuretics -Volume status has improved, plan for conservative management, cards plans repeat echo today  Persistent atrial fibrillation recentcardioversion 06/23/2021 but failed,  -Continue Eliquis  Acute encephalopathy -Initially related to hyponatremia from hypervolemia which has been improving, sodium up to 134 now -Somnolent and difficult to arouse this morning -Check ammonia level, urinalysis, will reassess in few hours  Hypoglycemia: Not on OHA and no known history of diabetes. -Now resolved, monitor  Hypervolemia hyponatremia:  -Improving with diuresis  Hypokalemia  -Severe, repleted  History of ulcerative colitis, total  colectomy -Has ileostomy  Goals of care/CODE STATUS: Multiple comorbidities and advanced age, frailty, palliative following  DVT prophylaxis: Apixaban Code Status:   Code Status: DNR Family Communication: Discussed with family at bedside Dispo, now plan for home with hospice services    Objective: Vitals last 24 hrs: Vitals:   07/15/22 1100 07/15/22 1900 07/16/22 0310 07/16/22 0857  BP: (!) 133/54 (!) 145/68 (!) 146/61 138/62  Pulse:  (!) 47 75   Resp:  17 19   Temp:  98.5 F (36.9 C) 98.1 F (36.7 C)   TempSrc:  Oral Oral   SpO2:  97% 98%   Weight:   51.1 kg   Height:       Weight change: -1.1 kg  Physical Examination:  Chronically ill frail female sitting up in bed, AAOx2, mild cognitive deficits, more alert HEENT: Positive JVD CVS: S1-S2, irregular rhythm Lungs: Decreased breath sounds to bases Abdomen: Soft, nontender, bowel sounds present Extremities: Trace edema   Medications reviewed:  Scheduled Meds:  apixaban  2.5 mg Oral BID   feeding supplement  237 mL Oral BID BM   furosemide  40 mg Oral Daily   lidocaine  1 patch Transdermal Q24H   lisinopril  20 mg Oral Daily   loratadine  10 mg Oral Daily   multivitamin with minerals  1 tablet Oral Daily   nystatin   Topical TID   pantoprazole  40 mg Oral Daily   sodium chloride flush  3 mL Intravenous Q12H   spironolactone  12.5 mg Oral Daily   Continuous Infusions:  sodium chloride        Diet Order             Diet regular Room service appropriate? Yes; Fluid consistency: Thin  Diet effective now                   Intake/Output Summary (Last 24 hours) at 07/16/2022 1010 Last  data filed at 07/16/2022 0721 Gross per 24 hour  Intake --  Output 600 ml  Net -600 ml   Net IO Since Admission: -6,340 mL [07/16/22 1010]  Wt Readings from Last 3 Encounters:  07/16/22 51.1 kg  06/28/22 59.6 kg  06/22/22 58.6 kg     Unresulted Labs (From admission, onward)    None     Data Reviewed: I have  personally reviewed following labs and imaging studies CBC: Recent Labs  Lab 07/11/22 1503 07/14/22 0035 07/15/22 0220 07/16/22 0243  WBC 5.1 8.5 12.3* 11.3*  HGB 11.3* 11.4* 11.4* 11.1*  HCT 36.0 35.8* 35.9* 35.2*  MCV 87.2 84.6 83.7 85.4  PLT 174 144* 127* 116*   Basic Metabolic Panel: Recent Labs  Lab 07/12/22 0217 07/13/22 0233 07/14/22 0035 07/15/22 0220 07/16/22 0243  NA 126* 129* 130* 134* 137  K 3.9 3.1* 3.0* 2.3* 3.6  CL 83* 78* 80* 85* 97*  CO2 34* 40* 38* 33* 29  GLUCOSE 83 61* 103* 101* 101*  BUN 15 10 10 18  32*  CREATININE 0.61 0.83 0.82 0.89 0.92  CALCIUM 8.6* 8.6* 9.1 9.7 9.9  WUJ:[id number] Creatinine Clearance: 30.9 mL/min (by C-G formula based on SCr of 0.92 mg/dL). Liver Function Tests: Recent Labs  Lab 07/11/22 1503 07/16/22 0243  AST 62* 22  ALT 51* 26  ALKPHOS 76 60  BILITOT 1.4* 1.0  PROT 6.7 6.3*  ALBUMIN 3.5 2.8*   Recent Labs  Lab 07/15/22 1656 07/15/22 2137 07/16/22 0145 07/16/22 0500 07/16/22 0734  GLUCAP 157* 168* 114* 142* 143*   No results for input(s): "TSH", "T4TOTAL", "FREET4", "T3FREE", "THYROIDAB" in the last 72 hours.  Sepsis Labs: Recent Labs  Lab 07/11/22 1633 07/11/22 2209  LATICACIDVEN 0.9 0.9    No results found for this or any previous visit (from the past 240 hour(s)).  Antimicrobials: Anti-infectives (From admission, onward)    None      Culture/Microbiology    Component Value Date/Time   SDES URINE, CLEAN CATCH 06/10/2022 1024   SPECREQUEST NONE 06/10/2022 1024   CULT (A) 06/10/2022 1024    <10,000 COLONIES/mL INSIGNIFICANT GROWTH Performed at [facility], [address], [place], [place] [address]    REPTSTATUS 06/11/2022 FINAL 06/10/2022 1024   Radiology Studies: No results found.   LOS: 5 days   Zannie Cove, MD [facility]  07/16/2022, 10:10 AM

## 2022-07-16 NOTE — Progress Notes (Signed)
Rounding Note    Patient Name: Sandy Trujillo Date of Encounter: 07/16/2022  [facility] Cardiologist: Reatha Harps, MD   Subjective   More alert.  No chest pain or dyspnea.  Inpatient Medications    Scheduled Meds:  acetaZOLAMIDE  250 mg Oral BID   apixaban  2.5 mg Oral BID   feeding supplement  237 mL Oral BID BM   furosemide  40 mg Oral Daily   lidocaine  1 patch Transdermal Q24H   lisinopril  20 mg Oral Daily   loratadine  10 mg Oral Daily   multivitamin with minerals  1 tablet Oral Daily   nystatin   Topical TID   pantoprazole  40 mg Oral Daily   sodium chloride flush  3 mL Intravenous Q12H   spironolactone  12.5 mg Oral Daily   Continuous Infusions:  sodium chloride     PRN Meds: sodium chloride, acetaminophen, dextrose, ondansetron (ZOFRAN) IV, sodium chloride flush   Vital Signs    Vitals:   07/15/22 0353 07/15/22 1100 07/15/22 1900 07/16/22 0310  BP: (!) 155/79 (!) 133/54 (!) 145/68 (!) 146/61  Pulse: 70  (!) 47 75  Resp: 17  17 19   Temp: 97.9 F (36.6 C)  98.5 F (36.9 C) 98.1 F (36.7 C)  TempSrc: Oral  Oral Oral  SpO2:   97% 98%  Weight: 52.2 kg   51.1 kg  Height:        Intake/Output Summary (Last 24 hours) at 07/16/2022 0757 Last data filed at 07/16/2022 8657 Gross per 24 hour  Intake --  Output 600 ml  Net -600 ml       07/16/2022    3:10 AM 07/15/2022    3:53 AM 07/14/2022    5:00 AM  Last 3 Weights  Weight (lbs) 112 lb 10.5 oz 115 lb 1.3 oz 118 lb 2.7 oz  Weight (kg) 51.1 kg 52.2 kg 53.6 kg      Telemetry    Atrial fibrillation rate controlled - Personally Reviewed  Physical Exam   GEN: NAD, frail Neck: supple, no JVD Cardiac: irrregular, no gallop Respiratory: Diminished breath sounds bases; no rhonchi GI: Soft, NT/ND, no masses MS: trace edema Neuro: grossly intact Psych: normal  Labs   Chemistry Recent Labs  Lab 07/11/22 1503 07/12/22 0217 07/14/22 0035 07/15/22 0220 07/16/22 0243  NA 123*    < > 130* 134* 137  K 4.0   < > 3.0* 2.3* 3.6  CL 80*   < > 80* 85* 97*  CO2 31   < > 38* 33* 29  GLUCOSE 62*   < > 103* 101* 101*  BUN 14   < > 10 18 32*  CREATININE 0.70   < > 0.82 0.89 0.92  CALCIUM 9.0   < > 9.1 9.7 9.9  PROT 6.7  --   --   --  6.3*  ALBUMIN 3.5  --   --   --  2.8*  AST 62*  --   --   --  22  ALT 51*  --   --   --  26  ALKPHOS 76  --   --   --  60  BILITOT 1.4*  --   --   --  1.0  GFRNONAA >60   < > >60 >60 >60  ANIONGAP 12   < > 12 16* 11   < > = values in this interval not displayed.    Hematology Recent  Labs  Lab 07/14/22 0035 07/15/22 0220 07/16/22 0243  WBC 8.5 12.3* 11.3*  RBC 4.23 4.29 4.12  HGB 11.4* 11.4* 11.1*  HCT 35.8* 35.9* 35.2*  MCV 84.6 83.7 85.4  MCH 27.0 26.6 26.9  MCHC 31.8 31.8 31.5  RDW 15.8* 15.7* 15.6*  PLT 144* 127* 116*    Thyroid  Recent Labs  Lab 07/11/22 1501  TSH 1.193     BNP Recent Labs  Lab 07/11/22 1501  BNP 664.9*      Radiology    No results found.   Patient Profile     89 year old female with past medical history of persistent atrial fibrillation, hypertension, hyperlipidemia, history of rectal carcinoma status post colectomy, ulcerative colitis, history of GI bleed for evaluation of acute diastolic congestive heart failure. Patient diagnosed with new onset atrial fibrillation in April 2024. She was treated with Eliquis and underwent successful cardioversion in May. Patient states that she has had fatigue and her symptoms did not improve following cardioversion. 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 She also has had difficulties with bradycardia. She was seen by Dr. Nelly Laurence and pacemaker not felt indicated at this point. Monitor showed atrial fibrillation with heart rate between 39 and 93 with average 65. She was readmitted with complaints of new onset lower ext edema, decreased mental status and functional decline.    Echocardiogram this admission shows ejection fraction 40 to 45% (I will need and feel it is low normal) which is decreased compared to previous, moderate RV dysfunction, mild right ventricular enlargement, severe pulmonary hypertension, severe biatrial enlargement, moderate to large pericardial effusion with mild diastolic collapse of the right atrium, large left pleural effusion; concern for early tamponade.  Assessment & Plan    1 Pericardial effusion-as outlined previously I personally reviewed the patient's prior echocardiogram.  Pericardial effusion appeared to be small in most views but possibly moderate effusion predominantly anteriorly in the subcostal views.  Follow-up noncontrast chest CT did not show pericardial effusion.  Blood pressure and heart rate remain normal and she does not appear to show tamponade physiology.  We are also trying to be conservative given patient's age and overall medical condition.  Will repeat limited study today to reassess. I am not convinced that pericardial effusion was contributing to her edema.   2 acute diastolic congestive heart failure-volume status has improved.  Will continue Lasix 40 mg daily and spironolactone 12.5 mg daily.  Contraction alkalosis has improved.  Discontinue acetazolamide.  Continue to follow renal function.    3 persistent atrial fibrillation-patient underwent recent cardioversion but atrial fibrillation recurred.  Her symptoms did not improve following cardioversion.  She was seen by Dr. Nelly Laurence and thought at present was to continue with rate control/anticoagulation.  She is requiring no AV nodal blocking agents.  He also felt that if rhythm control pursued she would require pacemaker.  Continue apixaban 2.5 mg twice daily.   4 declining mental status-I do not think her cognitive decline is secondary to atrial fibrillation.  She is much more alert today.  Continue evaluation per primary service.   5 hyponatremia-sodium has  improved.  6 hypertension-blood pressure improved today.  Continue lisinopril 20 mg daily and follow.  7 severe hypokalemia-improved following supplementation.  Needs continued mobilization and physical therapy.  For questions or updates, please contact [facility] Please consult [email or web address] for contact info under  Signed, Olga Millers, MD  07/16/2022, 7:57 AM

## 2022-07-16 NOTE — Progress Notes (Signed)
  Echocardiogram 2D Echocardiogram has been performed.  Sandy Trujillo 07/16/2022, 10:36 AM

## 2022-07-16 NOTE — Progress Notes (Signed)
Daily Progress Note   Patient Name: Sandy Trujillo       Date: 07/16/2022 DOB: 02-10-1932  Age: 89 y.o. MRN#: [id number] Attending Physician: Zannie Cove, MD Primary Care Physician: Thana Ates, MD Admit Date: 07/11/2022  Reason for Consultation/Follow-up: Establishing goals of care  Subjective: 11:53 AM Notified buy [facility] liaison family are now most interested in discharge with outpatient Palliative Care + HH rather than hospice and were hopeful to discuss further with PMT. Scheduled meeting with family for 2:15p.  2:15 PM I have reviewed medical records including EPIC notes, MAR, and labs. Received report from primary RN - no acute concerns. RN reports patient continues with minimal oral intake, is up to chair today.  Went to visit patient at bedside - all four children [name], [name]) at bedside. Patient was sitting up in chair. No signs or non-verbal gestures of pain or discomfort noted. No respiratory distress, increased work of breathing, or secretions noted.  Met with patient's children in 6E conference room per their request. Emotional support provided. Therapeutic listening provided as they express concern for "doing the wrong thing" seeing how much more awake patient is today. They do reflect on her wax/waning status over the last several days. They also express they were not aware patient could not have PT/OT with hospice. They are hopeful she can regain/keep as much function as possible for as long as she can, though the do understand the anticipated trajectory of continued decline. Family indicate they are working on getting private duty caregivers to assist.  Discussed in detail discharge options of rehab vs HH therapies vs hospice. Confirmed with family they are  not interested in rehab. They wish to take the patient home as they feel she will be more comfortable in her own environment. We reviewed HH therapies as being very limited compared to the support she will receive with hospice. Outlined option of having PT in house see patient and provide family with exercise regimen to continue after discharge. Reviewed that hospice staff also want to support exercise for function, if patient is able/willing. Family were very open to this. After discussion, family would like PT to recommend exercises they can assist patient with after discharge. They still wish for patient's discharge home with hospice.  Family express appreciation for PMT visit and assistance today.  All questions and concerns addressed. Encouraged to call with questions and/or concerns. PMT card provided.  Attempted to communicate plan with PT; however, was not able to receive response.  Length of Stay: 5  Current Medications: Scheduled Meds:   apixaban  2.5 mg Oral BID   feeding supplement  237 mL Oral BID BM   furosemide  40 mg Oral Daily   lidocaine  1 patch Transdermal Q24H   lisinopril  20 mg Oral Daily   loratadine  10 mg Oral Daily   multivitamin with minerals  1 tablet Oral Daily   nystatin   Topical TID   pantoprazole  40 mg Oral Daily   sodium chloride flush  3 mL Intravenous Q12H   spironolactone  12.5 mg Oral Daily    Continuous Infusions:  sodium chloride      PRN Meds: sodium chloride, acetaminophen, dextrose, ondansetron (ZOFRAN) IV, sodium chloride flush  Physical Exam Vitals and nursing note reviewed.  Constitutional:      General: She is not in acute distress.    Appearance: She is ill-appearing.  Pulmonary:     Effort: No respiratory distress.  Skin:    General: Skin is warm and dry.  Neurological:     Mental Status: She is alert.     Motor: Weakness present.             Vital Signs: BP 138/62   Pulse 61   Temp 97.6 F (36.4 C) (Oral)   Resp 19    Ht 5' (1.524 m)   Wt 51.1 kg   SpO2 99%   BMI 22.00 kg/m  SpO2: SpO2: 99 % O2 Device: O2 Device: Nasal Cannula O2 Flow Rate: O2 Flow Rate (L/min): 2 L/min  Intake/output summary:  Intake/Output Summary (Last 24 hours) at 07/16/2022 1358 Last data filed at 07/16/2022 1247 Gross per 24 hour  Intake --  Output 910 ml  Net -910 ml   LBM: Last BM Date : 07/16/22 Baseline Weight: Weight: 60 kg Most recent weight: Weight: 51.1 kg       Palliative Assessment/Data: PPS 20-30%      Patient Active Problem List   Diagnosis Date Noted   Acute metabolic encephalopathy 07/11/2022   Acute on chronic combined systolic and diastolic CHF (congestive heart failure) (HCC) 07/11/2022   Hypoglycemia 07/11/2022   Hypothermia 07/11/2022   Melena 06/11/2022   Acute blood loss anemia 06/11/2022   AVM (arteriovenous malformation) of small bowel, acquired with hemorrhage 06/11/2022   Symptomatic anemia 06/10/2022   GIB (gastrointestinal bleeding) 06/10/2022   Hyponatremia 06/10/2022   Hypocalcemia 06/10/2022   AV block, 2nd degree 06/09/2022   Persistent atrial fibrillation (HCC) 05/25/2022   Hypercoagulable state due to persistent atrial fibrillation (HCC) 05/25/2022   PAF (paroxysmal atrial fibrillation) (HCC) 05/09/2022   Pleural effusion on right 05/09/2022   Ileostomy in place [facility]) 05/09/2022   Anemia 05/09/2022   HYPERLIPIDEMIA 04/18/2007   Allergic rhinitis 04/18/2007   PULMONARY NODULE 04/18/2007   CARCINOMA, RECTUM 04/17/2007   ANXIETY 04/17/2007   Essential hypertension 04/17/2007   COLITIS, ULCERATIVE 04/17/2007    Palliative Care Assessment & Plan   Patient Profile: 89 y.o. female  with past medical history of GERD, hyperparathyroidism, essential hypertension, HLD, history of ileostomy, osteoporosis, history of rectal cancer, history of anemia and atrial fibrillation, ulcerative colitis, history of GI bleed admitted on 07/11/2022 with altered mental status.    Patient  was diagnosed with new onset atrial fibrillation in April 2024. She was treated  with Eliquis and underwent successful cardioversion in May. Patient states that she has had fatigue and her symptoms did not improve following cardioversion. 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 She also has had difficulties with bradycardia and new onset systolic CHF.    Echocardiogram this admission shows ejection fraction 40 to 45% which is decreased compared to previous, moderate RV dysfunction, mild right ventricular enlargement, severe pulmonary hypertension, severe biatrial enlargement, moderate to large pericardial effusion with mild diastolic collapse of the right atrium, large left pleural effusion; concern for early tamponade.   Assessment: Principal Problem:   Acute metabolic encephalopathy Active Problems:   CARCINOMA, RECTUM   ANXIETY   Essential hypertension   PAF (paroxysmal atrial fibrillation) (HCC)   Hyponatremia   Acute on chronic combined systolic and diastolic CHF (congestive heart failure) (HCC)   Hypoglycemia   Hypothermia   Concern about end of life  Recommendations/Plan: Continue to treat the treatable Continue DNR/DNI as previously documented Goal is for discharge home with hospice after PT is able to provided exercise regimen to family they can assist patient with at home PMT will continue to follow and support holistically  Goals of Care and Additional Recommendations: Limitations on Scope of Treatment: Avoid Hospitalization, Full Scope Treatment, and No Tracheostomy  Code Status:    Code Status Orders  (From admission, onward)           Start     Ordered   07/15/22 0905  Do not attempt resuscitation (DNR)  Continuous       Question Answer Comment  If patient has no pulse and is not breathing Do Not Attempt Resuscitation   If patient has a pulse and/or is breathing: Medical Treatment Goals  LIMITED ADDITIONAL INTERVENTIONS: Use medication/IV fluids and cardiac monitoring as indicated; Do not use intubation or mechanical ventilation (DNI), also provide comfort medications.  Transfer to Progressive/Stepdown as indicated, avoid Intensive Care.   Consent: Discussion documented in EHR or advanced directives reviewed      07/15/22 0904           Code Status History     Date Active Date Inactive Code Status Order ID Comments User Context   07/11/2022 2031 07/15/2022 0904 Full Code [id number]  Rometta Emery, MD ED   06/09/2022 1827 06/13/2022 1857 Full Code [id number]  Jonita Albee, PA-C ED   05/09/2022 2354 05/10/2022 2042 Full Code [id number]  Carollee Herter, DO ED   05/09/2022 2307 05/09/2022 2354 Full Code [id number]  Carollee Herter, DO ED       Prognosis:  < 6 months  Discharge Planning: Home with Hospice  Care plan was discussed with primary RN, patient's family, TOC, Dr. Jomarie Longs, attempted discussion with PT  Thank you for allowing the Palliative Medicine Team to assist in the care of this patient.   Total Time 70 minutes Prolonged Time Billed  yes       Greater than 50%  of this time was spent counseling and coordinating care related to the above assessment and plan.  Haskel Khan, NP  Please contact Palliative Medicine Team phone at [phone] for questions and concerns.   *Portions of this note are a verbal dictation therefore any spelling and/or grammatical errors are due to the "[employer]agon [employer] One" system interpretation.

## 2022-07-17 DIAGNOSIS — G9341 Metabolic encephalopathy: Secondary | ICD-10-CM | POA: Diagnosis not present

## 2022-07-17 LAB — GLUCOSE, CAPILLARY
Glucose-Capillary: 104 mg/dL — ABNORMAL HIGH (ref 70–99)
Glucose-Capillary: 108 mg/dL — ABNORMAL HIGH (ref 70–99)
Glucose-Capillary: 154 mg/dL — ABNORMAL HIGH (ref 70–99)

## 2022-07-17 MED ORDER — SPIRONOLACTONE 25 MG PO TABS: 12.5000 mg | ORAL_TABLET | Freq: Every day | ORAL | 0 refills | Status: DC

## 2022-07-17 MED ORDER — APIXABAN 2.5 MG PO TABS: 2.5000 mg | ORAL_TABLET | Freq: Two times a day (BID) | ORAL | 1 refills | Status: DC

## 2022-07-17 MED ORDER — FUROSEMIDE 40 MG PO TABS: 40.0000 mg | ORAL_TABLET | Freq: Every day | ORAL | 0 refills | Status: DC

## 2022-07-17 NOTE — TOC Transition Note (Signed)
Transition of Care [facility]) - CM/SW Discharge Note   Patient Details  Name: Sandy Trujillo MRN: [id number] Date of Birth: 11-14-1932  Transition of Care [facility]) CM/SW Contact:  Ronny Bacon, RN Phone Number: 07/17/2022, 11:17 AM   Clinical Narrative:   Patient being discharged home today. Daughter [name] confirmed that DME has been delivered to the house as of yesterday afternoon. Daughter reports that she was not given instructions on how to use DME delivered. Reached out to Hospice Liaison to inform of same and will have a nurse come out to the house around 3pm to educate the family. PTAR transportation arranged.     Final next level of care: Home w Hospice Care Barriers to Discharge: No Barriers Identified   Patient Goals and CMS Choice CMS Medicare.gov Compare Post Acute Care list provided to:: Patient Represenative (must comment) Choice offered to / list presented to : Adult Children  Discharge Placement                         Discharge Plan and Services Additional resources added to the After Visit Summary for   In-house Referral: NA Discharge Planning Services: CM Consult Post Acute Care Choice: Home Health, Resumption of Svcs/PTA Provider            DME Agency: NA       HH Arranged: RN HH Agency: [facility] Date HH Agency Contacted: 07/15/22 Time HH Agency Contacted: 1217 Representative spoke with at [facility] Agency: shanita  Social Determinants of Health (SDOH) Interventions SDOH Screenings   Food Insecurity: No Food Insecurity (07/11/2022)  Housing: Patient Unable To Answer (07/11/2022)  Transportation Needs: No Transportation Needs (07/11/2022)  Utilities: Not At Risk (07/11/2022)  Alcohol Screen: Low Risk  (07/11/2022)  Depression (PHQ2-9): Low Risk  (07/11/2022)  Financial Resource Strain: Low Risk  (07/11/2022)  Physical Activity: Insufficiently Active (07/11/2022)  Stress: No Stress Concern Present (07/11/2022)  Tobacco  Use: Low Risk  (07/11/2022)     Readmission Risk Interventions     No data to display

## 2022-07-17 NOTE — Progress Notes (Signed)
Brief cardiology progress note:  Discharging home with hospice care. Cardiology will sign off but please contact with questions/concerns. See note from Dr. Jens Som yesterday.  Jodelle Red, MD, PhD, [facility] [place]  [facility]  [place]  [facility] at [facility] [address], [address] [place], [place] [address] [phone]

## 2022-07-17 NOTE — Progress Notes (Signed)
Brief Palliative Medicine Progress Note:  Reached out to PT/Lynn and discussed family's goals prior to discharge. Family are hopeful for patient's discharge home with hospice today after insight and education from PT.  Thank you for allowing PMT to assist in the care of this patient.  Yunior Jain M. Katrinka Blazing [facility] Palliative Medicine Team Team Phone: [phone] NO CHARGE

## 2022-07-17 NOTE — Progress Notes (Signed)
Physical Therapy Treatment Patient Details Name: Sandy Trujillo MRN: [id number] DOB: May 06, 1932 Today's Date: 07/17/2022   History of Present Illness Pt is an 89 y/o female admitted secondary to AMS and bilateral LE edema. Pt found to have acute onset CHF and metabolic encephalopathy. PMH including but not limited to GERD, hyperparathyroidism, essential hypertension, history of ileostomy, osteoporosis, history of rectal cancer, history of anemia and atrial fibrillation on treatment.    PT Comments    Two daughters present with pt and requesting education on home exercise program for pt. Provided with handout for bed-level exercises and how to modify the exercises if pt is up in the chair (as she was during session). Daughters asking for tips on how to transfer their mother if using a gait belt (which they already purchased). Explained and demonstrated use of gait belt. They asked if sit to stand would be a good exercise to work on with their mom. Noted she tends to push posteriorly as coming to stand and we discussed technique to try to limit this posture and that sit to stand would be a great exercise. Pt/daughters appreciative of session and handout.     Recommendations for follow up therapy are one component of a multi-disciplinary discharge planning process, led by the attending physician.  Recommendations may be updated based on patient status, additional functional criteria and insurance authorization.  Follow Up Recommendations  Can patient physically be transported by private vehicle: No    Assistance Recommended at Discharge Frequent or constant Supervision/Assistance  Patient can return home with the following A lot of help with walking and/or transfers;A lot of help with bathing/dressing/bathroom;Assistance with cooking/housework;Help with stairs or ramp for entrance;Assist for transportation   Equipment Recommendations  Rolling walker (2 wheels);Hospital bed    Recommendations  for Other Services       Precautions / Restrictions Precautions Precautions: Fall Restrictions Weight Bearing Restrictions: No     Mobility  Bed Mobility               General bed mobility comments: pt up in recliner on arrival; daughters report they helpedd her transfer using the RW    Transfers                        Ambulation/Gait                   Stairs             Wheelchair Mobility    Modified Rankin (Stroke Patients Only)       Balance Overall balance assessment: Needs assistance Sitting-balance support: Feet supported, No upper extremity supported Sitting balance-Leahy Scale: Fair                                      Cognition Arousal/Alertness: Awake/alert Behavior During Therapy: WFL for tasks assessed/performed Overall Cognitive Status: Impaired/Different from baseline Area of Impairment: Following commands, Safety/judgement, Awareness, Problem solving                       Following Commands: Follows one step commands with increased time Safety/Judgement: Decreased awareness of safety, Decreased awareness of deficits Awareness: Intellectual Problem Solving: Decreased initiation, Requires verbal cues, Slow processing, Difficulty sequencing General Comments: attended well to exercise education        Exercises General Exercises - Lower Extremity Ankle Circles/Pumps: AROM, Both, 10  reps [employer]:  (educated daughters; pt could not perform in chair) Gluteal Sets:  (educated daughters; pt could not perform in chair) Short Arc Quad:  (educated daughters; pt could not perform in chair) Long Arc Quad: AROM, Both, 5 reps, Seated Heel Slides:  (educated daughters; pt could not perform in chair) Hip ABduction/ADduction:  (educated daughters; pt could not perform in chair) Other Exercises Other Exercises: shoulder flexion in sitting; pt uses bil UEs and achieves ~110 degrees    General Comments         Pertinent Vitals/Pain Pain Assessment Pain Assessment: Faces Faces Pain Scale: No hurt    Home Living                          Prior Function            PT Goals (current goals can now be found in the care plan section) Acute Rehab PT Goals Patient Stated Goal: to get stronger, to go on a cruise in December Time For Goal Achievement: 07/26/22 Potential to Achieve Goals: Good Progress towards PT goals: Not progressing toward goals - comment    Frequency    Min 1X/week      PT Plan Current plan remains appropriate    Co-evaluation              AM-PAC PT "6 Clicks" Mobility   Outcome Measure  Help needed turning from your back to your side while in a flat bed without using bedrails?: A Lot Help needed moving from lying on your back to sitting on the side of a flat bed without using bedrails?: A Lot Help needed moving to and from a bed to a chair (including a wheelchair)?: A Lot Help needed standing up from a chair using your arms (e.g., wheelchair or bedside chair)?: A Lot Help needed to walk in hospital room?: Total Help needed climbing 3-5 steps with a railing? : Total 6 Click Score: 10    End of Session   Activity Tolerance: Patient tolerated treatment well Patient left: in chair;with call bell/phone within reach;with family/visitor present   PT Visit Diagnosis: Other abnormalities of gait and mobility (R26.89)     Time: 2130-8657 PT Time Calculation (min) (ACUTE ONLY): 11 min  Charges:  $Therapeutic Exercise: 8-22 mins                      Jerolyn Center, PT Acute Rehabilitation Services  Office [phone]    Zena Amos 07/17/2022, 11:58 AM

## 2022-07-17 NOTE — Discharge Summary (Signed)
Physician Discharge Summary  Sandy Trujillo WGN:[id number] DOB: 08-20-1932 DOA: 07/11/2022  PCP: Thana Ates, MD  Admit date: 07/11/2022 Discharge date: 07/17/2022  Time spent:35 minutes  Recommendations for Outpatient Follow-up:  Discharged home with hospice services   Discharge Diagnoses:  Acute on chronic combined CHF Pulmonary hypertension Hyponatremia Adult failure to thrive   Acute metabolic encephalopathy Active Problems:   PAF (paroxysmal atrial fibrillation) (HCC)   ANXIETY   Essential hypertension   CARCINOMA, RECTUM   Hyponatremia   Acute on chronic combined systolic and diastolic CHF (congestive heart failure) (HCC)   Hypoglycemia   Hypothermia   Discharge Condition: Improved  Diet recommendation:, Heart healthy  Filed Weights   07/15/22 0353 07/16/22 0310 07/17/22 0657  Weight: 52.2 kg 51.1 kg 48.3 kg    History of present illness:  89/F with history of ulcerative colitis, total colectomy, ileostomy, chronic diastolic CHF was diagnosed with A-fib in 4/24, underwent successful cardioversion in May subsequently diagnosed with anemia and small bowel AVM requiring blood transfusions. -Admitted with functional decline, worsening edema and worsening mental status.  Noted to have worsening cardiomyopathy, volume overload, hyponatremia, EF down to 40-45% moderate RV dysfunction and severe pulmonary hypertension -Cards and palliative care following, conservative management  Hospital Course:   New onset systolic CHF EF 40-45%  Moderately reduced RV systolic function, severely elevated pulmonary artery systolic pressure Left pleural effusion -Cards following, diuresed with IV Lasix, improving, weight down 17 LB -Changed to oral diuretics, continue Aldactone -Conservative management with advanced age and frailty, palliative care following, family is considering hospice at discharge   Moderate pericardial effusion -Clinically without evidence of tamponade,  improving with diuretics -Volume status has improved, plan for conservative management, repeat echo with improvement in size of pericardial effusion   Persistent atrial fibrillation recentcardioversion 06/23/2021 but failed,  -Continue Eliquis   Acute encephalopathy -Initially related to hyponatremia from hypervolemia which has been improving, sodium up to 134 now -Somnolent and difficult to arouse yesterday, mental status is improved, UA and ammonia level were unremarkable   Hypoglycemia: Not on OHA and no known history of diabetes. -Now resolved, monitor   Hypervolemia hyponatremia:  -Improving with diuresis   Hypokalemia  -Severe, repleted   History of ulcerative colitis, total colectomy -Has ileostomy   Goals of care/CODE STATUS: Multiple comorbidities and advanced age, frailty, palliative following, now DNR, plan for home with hospice     Discharge Exam: Vitals:   07/17/22 0300 07/17/22 1112  BP: (!) 155/82 128/65  Pulse: 76 65  Resp: 20 19  Temp: (!) 97.4 F (36.3 C) (!) 97.3 F (36.3 C)  SpO2: 97% 98%   Gen: Awake, Alert, Oriented X 3,  HEENT: Positive JVD Lungs: Decreased sounds at the bases CVS: S1S2/RRR Abd: soft, Non tender, non distended, BS present, ileostomy Extremities: Trace edema Skin: no new rashes on exposed skin   Discharge Instructions   Discharge Instructions     Diet - low sodium heart healthy   Complete by: As directed    Increase activity slowly   Complete by: As directed       Allergies as of 07/17/2022       Reactions   Actonel [risedronate] Other (See Comments)   Lowered BP   Buspar [buspirone] Other (See Comments)   Increased risk for Afib   Sertraline Diarrhea        Medication List     TAKE these medications    acetaminophen 500 MG tablet Commonly known as: TYLENOL Take  500-1,000 mg by mouth every 6 (six) hours as needed (pain.).   ALPRAZolam 0.25 MG tablet Commonly known as: XANAX Take 0.25 mg by mouth 2  (two) times daily as needed.   apixaban 2.5 MG Tabs tablet Commonly known as: ELIQUIS Take 1 tablet (2.5 mg total) by mouth 2 (two) times daily. Resume from 06/14/2022 as per cardiology/GI recommendations What changed:  medication strength how much to take   CENTRUM SILVER PO Take 1 tablet by mouth daily.   ferrous sulfate 325 (65 FE) MG tablet Take 325 mg by mouth daily at 12 noon.   furosemide 40 MG tablet Commonly known as: LASIX Take 1 tablet (40 mg total) by mouth daily. Start taking on: July 18, 2022   lisinopril 10 MG tablet Commonly known as: ZESTRIL Take 1 tablet (10 mg total) by mouth daily.   loratadine 5 MG chewable tablet Commonly known as: CLARITIN Chew 5 mg by mouth daily.   ondansetron 4 MG disintegrating tablet Commonly known as: ZOFRAN-ODT Take 4 mg by mouth 2 (two) times daily.   pantoprazole 40 MG tablet Commonly known as: PROTONIX Take 1 tablet (40 mg total) by mouth daily.   spironolactone 25 MG tablet Commonly known as: ALDACTONE Take 0.5 tablets (12.5 mg total) by mouth daily. Start taking on: July 18, 2022               Durable Medical Equipment  (From admission, onward)           Start     Ordered   07/14/22 1706  For home use only DME Walker rolling  Once       Question Answer Comment  Walker: With 5 Inch Wheels   Patient needs a walker to treat with the following condition General weakness   Patient needs a walker to treat with the following condition Acute metabolic encephalopathy      07/14/22 1706           Allergies  Allergen Reactions   Actonel [Risedronate] Other (See Comments)    Lowered BP   Buspar [Buspirone] Other (See Comments)    Increased risk for Afib   Sertraline Diarrhea    Follow-up Information     [place] Follow up.   Why: [place] Location: [phone]  Registered Nurse, Physical and Occupational Therapy, Aide-office to call with visit times. Contact  information: [address] [place] [address] [phone]         [facility] Follow up.   Specialty: PALLIATIVE CARE Why: Outpatient Palliative Services-office to call with visit times. Contact information: [address] [facility] [address] [phone]        [facility] Follow up.   Why: Rolling Walker-to be delivered to the room prior to transition home. Contact information: [address] [place] [address] [phone]                  The results of significant diagnostics from this hospitalization (including imaging, microbiology, ancillary and laboratory) are listed below for reference.    Significant Diagnostic Studies: ECHOCARDIOGRAM LIMITED  Result Date: 07/16/2022    ECHOCARDIOGRAM LIMITED REPORT   Patient Name:   Sandy Trujillo Date of Exam: 07/16/2022 Medical Rec #:  [id number]         Height:       60.0 in Accession #:    [id number]        Weight:  112.7 lb Date of Birth:  August 26, 1932        BSA:          1.463 m Patient Age:    89 years          BP:           138/62 mmHg Patient Gender: F                 HR:           74 bpm. Exam Location:  Inpatient Procedure: Cardiac Doppler, Limited Echo and Limited Color Doppler Indications:    pericardial effusion  History:        Patient has prior history of Echocardiogram examinations, most                 recent 07/12/2022. CHF, Arrythmias:Atrial Fibrillation,                 Signs/Symptoms:Edema; Risk Factors:Hypertension and                 Dyslipidemia.  Sonographer:    Delcie Roch RDCS Referring Phys: [id number] BRIAN S CRENSHAW IMPRESSIONS  1. Left ventricular ejection fraction, by estimation, is 45 to 50%. The left ventricle has mildly decreased function. Left ventricular endocardial border not optimally defined to evaluate regional wall motion.  2. The right ventricular size is moderately enlarged. There is moderately elevated pulmonary artery systolic pressure.  The estimated right ventricular systolic pressure is 45.9 mmHg.  3. Left atrial size was moderately dilated.  4. Right atrial size was severely dilated.  5. A small pericardial effusion is present. The pericardial effusion is posterior to the left ventricle. Moderate pleural effusion in the left lateral region.  6. The mitral valve is degenerative. Moderate mitral valve regurgitation.  7. Tricuspid valve regurgitation is mild to moderate.  8. The aortic valve is tricuspid. There is moderate calcification of the aortic valve. No aortic stenosis is present.  9. The inferior vena cava is normal in size with <50% respiratory variability, suggesting right atrial pressure of 8 mmHg. FINDINGS  Left Ventricle: Left ventricular ejection fraction, by estimation, is 45 to 50%. The left ventricle has mildly decreased function. Left ventricular endocardial border not optimally defined to evaluate regional wall motion. Right Ventricle: The right ventricular size is moderately enlarged. There is moderately elevated pulmonary artery systolic pressure. The tricuspid regurgitant velocity is 3.08 m/s, and with an assumed right atrial pressure of 8 mmHg, the estimated right ventricular systolic pressure is 45.9 mmHg. Left Atrium: Left atrial size was moderately dilated. Right Atrium: Right atrial size was severely dilated. Pericardium: A small pericardial effusion is present. The pericardial effusion is posterior to the left ventricle. Mitral Valve: The mitral valve is degenerative in appearance. Moderate mitral valve regurgitation. Tricuspid Valve: The tricuspid valve is grossly normal. Tricuspid valve regurgitation is mild to moderate. Aortic Valve: The aortic valve is tricuspid. There is moderate calcification of the aortic valve. No aortic stenosis is present. Pulmonic Valve: The pulmonic valve was grossly normal. Pulmonic valve regurgitation is mild. Venous: The inferior vena cava is normal in size with less than 50% respiratory  variability, suggesting right atrial pressure of 8 mmHg. Additional Comments: There is a moderate pleural effusion in the left lateral region. Spectral Doppler performed. Color Doppler performed.  IVC IVC diam: 1.70 cm TRICUSPID VALVE TR Peak grad:   37.9 mmHg TR Vmax:        308.00 cm/s Weston Brass MD Electronically signed by Lynda Rainwater  Jacques Navy MD Signature Date/Time: 07/16/2022/2:06:05 PM    Final    CT CHEST WO CONTRAST  Result Date: 07/13/2022 CLINICAL DATA:  Pleural effusion, pericardial effusion. EXAM: CT CHEST WITHOUT CONTRAST TECHNIQUE: Multidetector CT imaging of the chest was performed following the standard protocol without IV contrast. RADIATION DOSE REDUCTION: This exam was performed according to the departmental dose-optimization program which includes automated exposure control, adjustment of the mA and/or kV according to patient size and/or use of iterative reconstruction technique. COMPARISON:  Chest radiograph 07/11/2022, CTA chest 05/10/2022 FINDINGS: Cardiovascular: The heart is enlarged. Coronary artery calcifications, aortic valve calcifications, and calcified plaque in the thoracic aorta are noted. There is no pericardial effusion. Mediastinum/Nodes: The thyroid is unremarkable. The esophagus is patulous with layering fluid. There is no mediastinal, hilar, or axillary lymphadenopathy. Lungs/Pleura: The trachea and central airways are patent. The moderate-sized right and small left pleural effusion are increased in size since 05/09/2022, with increased adjacent atelectasis in both lower lobes. Previously seen nodular ground-glass opacities have overall improved. There is no pneumothorax. Upper Abdomen: The imaged portions of the upper abdominal viscera are unremarkable. Musculoskeletal: There is no acute osseous abnormality or suspicious osseous lesion. IMPRESSION: 1. Moderate-sized right and small left pleural effusions have increased in size since 05/09/2022, with increased adjacent  atelectasis in both lower lobes. 2. Previously seen nodular ground-glass opacities have overall improved which may reflect improved pneumonia or aspiration. 3. Patulous esophagus with layering fluid. Electronically Signed   By: Lesia Hausen M.D.   On: 07/13/2022 14:08   ECHOCARDIOGRAM COMPLETE  Result Date: 07/12/2022    ECHOCARDIOGRAM REPORT   Patient Name:   Sandy Trujillo Date of Exam: 07/12/2022 Medical Rec #:  [id number]         Height:       60.0 in Accession #:    [id number]        Weight:       131.8 lb Date of Birth:  May 02, 1932        BSA:          1.564 m Patient Age:    89 years          BP:           135/57 mmHg Patient Gender: F                 HR:           64 bpm. Exam Location:  Inpatient Procedure: 2D Echo, Cardiac Doppler and Color Doppler Indications:    CHF - Acute Diastolic  History:        Patient has prior history of Echocardiogram examinations, most                 recent 05/10/2022. CHF, cancer, Arrythmias:Atrial Fibrillation                 and 2nd degree AV block, Signs/Symptoms:Edema and Shortness of                 Breath; Risk Factors:Hypertension and Dyslipidemia.  Sonographer:    Wallie Char Referring Phys: [id number] MOHAMMAD L GARBA IMPRESSIONS  1. Left ventricular ejection fraction, by estimation, is 40 to 45%. The left ventricle has mildly decreased function. The left ventricle demonstrates regional wall motion abnormalities (see scoring diagram/findings for description). Left ventricular diastolic parameters are indeterminate.  2. Right ventricular systolic function is moderately reduced. The right ventricular size is mildly enlarged. There is severely elevated pulmonary artery systolic pressure. The estimated  right ventricular systolic pressure is 62.1 mmHg.  3. Left atrial size was severely dilated.  4. Right atrial size was severely dilated.  5. There is a moderate to large pericardial effusion adjacent to the RV and RA on the subcostal images with mild diastolic collpase  of the RA. The IVC is markedly dialted. Concern for early tamponade physiology. Moderate pericardial effusion. The pericardial effusion is posterior to the left ventricle, localized near the right atrium and anterior to the right ventricle. Large pleural effusion in the left lateral region.  6. The mitral valve is normal in structure. Moderate mitral valve regurgitation. No evidence of mitral stenosis.  7. Tricuspid valve regurgitation is moderate to severe.  8. The aortic valve is tricuspid. There is mild calcification of the aortic valve. Aortic valve regurgitation is not visualized. Aortic valve sclerosis/calcification is present, without any evidence of aortic stenosis.  9. The inferior vena cava is normal in size with greater than 50% respiratory variability, suggesting right atrial pressure of 3 mmHg. Conclusion(s)/Recommendation(s): There is a moderate to large pericardial effusion adjacent to the RV and RA on the subcostal images with mild diastolic collpase of the RA. The IVC is markedly dialted. Concern for early tamponade physiology. FINDINGS  Left Ventricle: Left ventricular ejection fraction, by estimation, is 40 to 45%. The left ventricle has mildly decreased function. The left ventricle demonstrates regional wall motion abnormalities. The left ventricular internal cavity size was normal in size. There is no left ventricular hypertrophy. Left ventricular diastolic parameters are indeterminate.  LV Wall Scoring: The entire septum and posterior wall are hypokinetic. Right Ventricle: The right ventricular size is mildly enlarged. No increase in right ventricular wall thickness. Right ventricular systolic function is moderately reduced. There is severely elevated pulmonary artery systolic pressure. The tricuspid regurgitant velocity is 3.43 m/s, and with an assumed right atrial pressure of 15 mmHg, the estimated right ventricular systolic pressure is 62.1 mmHg. Left Atrium: Left atrial size was severely  dilated. Right Atrium: Right atrial size was severely dilated. Pericardium: There is a moderate to large pericardial effusion adjacent to the RV and RA on the subcostal images with mild diastolic collpase of the RA. The IVC is markedly dialted. Concern for early tamponade physiology. A moderately sized pericardial effusion is present. The pericardial effusion is posterior to the left ventricle, localized near the right atrium and anterior to the right ventricle. Mitral Valve: The mitral valve is normal in structure. Moderate mitral valve regurgitation, with centrally-directed jet. No evidence of mitral valve stenosis. MV peak gradient, 6.8 mmHg. The mean mitral valve gradient is 2.0 mmHg. Tricuspid Valve: The tricuspid valve is normal in structure. Tricuspid valve regurgitation is moderate to severe. No evidence of tricuspid stenosis. Aortic Valve: The aortic valve is tricuspid. There is mild calcification of the aortic valve. Aortic valve regurgitation is not visualized. Aortic valve sclerosis/calcification is present, without any evidence of aortic stenosis. Aortic valve mean gradient measures 5.0 mmHg. Aortic valve peak gradient measures 10.2 mmHg. Aortic valve area, by VTI measures 1.66 cm. Pulmonic Valve: The pulmonic valve was normal in structure. Pulmonic valve regurgitation is trivial. No evidence of pulmonic stenosis. Aorta: The aortic root is normal in size and structure. Venous: The inferior vena cava is normal in size with greater than 50% respiratory variability, suggesting right atrial pressure of 3 mmHg. IAS/Shunts: No atrial level shunt detected by color flow Doppler. Additional Comments: There is a large pleural effusion in the left lateral region.  LEFT VENTRICLE PLAX 2D LVIDd:  4.80 cm     Diastology LVIDs:         3.70 cm     LV e' medial:    4.72 cm/s LV PW:         0.80 cm     LV E/e' medial:  26.3 LV IVS:        0.70 cm     LV e' lateral:   6.39 cm/s LVOT diam:     1.70 cm     LV E/e'  lateral: 19.4 LV SV:         63 LV SV Index:   40 LVOT Area:     2.27 cm  LV Volumes (MOD) LV vol d, MOD A2C: 78.4 ml LV vol d, MOD A4C: 69.5 ml LV vol s, MOD A2C: 36.8 ml LV vol s, MOD A4C: 33.4 ml LV SV MOD A2C:     41.6 ml LV SV MOD A4C:     69.5 ml LV SV MOD BP:      38.4 ml RIGHT VENTRICLE            IVC RV Basal diam:  4.20 cm    IVC diam: 2.00 cm RV S prime:     9.05 cm/s TAPSE (M-mode): 1.5 cm LEFT ATRIUM             Index        RIGHT ATRIUM           Index LA diam:        5.10 cm 3.26 cm/m   RA Area:     28.70 cm LA Vol (A2C):   78.1 ml 49.95 ml/m  RA Volume:   117.00 ml 74.83 ml/m LA Vol (A4C):   64.3 ml 41.12 ml/m LA Biplane Vol: 72.1 ml 46.11 ml/m  AORTIC VALVE AV Area (Vmax):    1.70 cm AV Area (Vmean):   1.64 cm AV Area (VTI):     1.66 cm AV Vmax:           159.50 cm/s AV Vmean:          106.500 cm/s AV VTI:            0.376 m AV Peak Grad:      10.2 mmHg AV Mean Grad:      5.0 mmHg LVOT Vmax:         119.33 cm/s LVOT Vmean:        76.833 cm/s LVOT VTI:          0.276 m LVOT/AV VTI ratio: 0.73  AORTA Ao Root diam: 3.00 cm Ao Asc diam:  3.20 cm MITRAL VALVE                  TRICUSPID VALVE MV Area (PHT): 4.86 cm       TR Peak grad:   47.1 mmHg MV Area VTI:   1.96 cm       TR Mean grad:   30.0 mmHg MV Peak grad:  6.8 mmHg       TR Vmax:        343.00 cm/s MV Mean grad:  2.0 mmHg       TR Vmean:       259.0 cm/s MV Vmax:       1.30 m/s MV Vmean:      59.0 cm/s      SHUNTS MV Decel Time: 156 msec       Systemic VTI:  0.28 m MR Peak grad:  151.8 mmHg   Systemic Diam: 1.70 cm MR Mean grad:    104.0 mmHg MR Vmax:         616.00 cm/s MR Vmean:        489.0 cm/s MR PISA:         2.26 cm MR PISA Eff ROA: 11 mm MR PISA Radius:  0.60 cm MV E velocity: 124.00 cm/s MV A velocity: 52.67 cm/s MV E/A ratio:  2.35 Arvilla Meres MD Electronically signed by Arvilla Meres MD Signature Date/Time: 07/12/2022/5:06:43 PM    Final    CT Head Wo Contrast  Result Date: 07/11/2022 CLINICAL DATA:  Altered  mental status. EXAM: CT HEAD WITHOUT CONTRAST TECHNIQUE: Contiguous axial images were obtained from the base of the skull through the vertex without intravenous contrast. RADIATION DOSE REDUCTION: This exam was performed according to the departmental dose-optimization program which includes automated exposure control, adjustment of the mA and/or kV according to patient size and/or use of iterative reconstruction technique. COMPARISON:  None Available. FINDINGS: Brain: Mild age-related atrophy and chronic microvascular ischemic changes. There is no acute intracranial hemorrhage. No mass effect or midline shift. No extra-axial fluid collection. Vascular: No hyperdense vessel or unexpected calcification. Skull: Normal. Negative for fracture or focal lesion. Sinuses/Orbits: No acute finding. Other: None IMPRESSION: 1. No acute intracranial pathology. 2. Mild age-related atrophy and chronic microvascular ischemic changes. Electronically Signed   By: Elgie Collard M.D.   On: 07/11/2022 19:01   DG Chest 2 View  Result Date: 07/11/2022 CLINICAL DATA:  SOB EXAM: CHEST - 2 VIEW COMPARISON:  June 30, 2022 FINDINGS: The cardiomediastinal silhouette is unchanged in contour. Small bilateral pleural effusions, mildly increased. No pneumothorax. Mildly increased bibasilar homogeneous platelike opacities. Mildly increased interstitial markings diffusely. Visualized abdomen is unremarkable. Mild multilevel degenerative changes of the thoracic spine. IMPRESSION: Constellation of findings are favored to reflect mild pulmonary edema with increased small bilateral pleural effusions and bibasilar atelectasis. Electronically Signed   By: Meda Klinefelter M.D.   On: 07/11/2022 15:47   DG Chest 2 View  Result Date: 06/30/2022 CLINICAL DATA:  Acute cough EXAM: CHEST - 2 VIEW COMPARISON:  06/09/2022 FINDINGS: Heart and mediastinal contours within normal limits. Small to moderate bilateral pleural effusions. Bibasilar atelectasis. No  acute bony abnormality. Biapical scarring. IMPRESSION: Small to moderate bilateral effusions with bibasilar atelectasis. Electronically Signed   By: Charlett Nose M.D.   On: 06/30/2022 12:02    Microbiology: No results found for this or any previous visit (from the past 240 hour(s)).   Labs: Basic Metabolic Panel: Recent Labs  Lab 07/12/22 0217 07/13/22 0233 07/14/22 0035 07/15/22 0220 07/16/22 0243  NA 126* 129* 130* 134* 137  K 3.9 3.1* 3.0* 2.3* 3.6  CL 83* 78* 80* 85* 97*  CO2 34* 40* 38* 33* 29  GLUCOSE 83 61* 103* 101* 101*  BUN 15 10 10 18  32*  CREATININE 0.61 0.83 0.82 0.89 0.92  CALCIUM 8.6* 8.6* 9.1 9.7 9.9   Liver Function Tests: Recent Labs  Lab 07/11/22 1503 07/16/22 0243  AST 62* 22  ALT 51* 26  ALKPHOS 76 60  BILITOT 1.4* 1.0  PROT 6.7 6.3*  ALBUMIN 3.5 2.8*   No results for input(s): "LIPASE", "AMYLASE" in the last 168 hours. Recent Labs  Lab 07/15/22 0917  AMMONIA 33   CBC: Recent Labs  Lab 07/11/22 1503 07/14/22 0035 07/15/22 0220 07/16/22 0243  WBC 5.1 8.5 12.3* 11.3*  HGB 11.3* 11.4* 11.4* 11.1*  HCT 36.0 35.8*  35.9* 35.2*  MCV 87.2 84.6 83.7 85.4  PLT 174 144* 127* 116*   Cardiac Enzymes: No results for input(s): "CKTOTAL", "CKMB", "CKMBINDEX", "TROPONINI" in the last 168 hours. BNP: BNP (last 3 results) Recent Labs    07/11/22 1501  BNP 664.9*    ProBNP (last 3 results) No results for input(s): "PROBNP" in the last 8760 hours.  CBG: Recent Labs  Lab 07/16/22 1615 07/16/22 2046 07/17/22 0434 07/17/22 0724 07/17/22 1149  GLUCAP 131* 185* 104* 108* 154*       Signed:  Zannie Cove MD.  [facility] 07/17/2022, 12:48 PM

## 2022-07-23 ENCOUNTER — Ambulatory Visit: Payer: Medicare Other | Attending: Cardiovascular Disease | Admitting: Cardiovascular Disease

## 2022-07-23 ENCOUNTER — Encounter: Payer: Self-pay | Admitting: Cardiovascular Disease

## 2022-07-25 DIAGNOSIS — F411 Generalized anxiety disorder: Secondary | ICD-10-CM | POA: Diagnosis not present

## 2022-07-26 DIAGNOSIS — K219 Gastro-esophageal reflux disease without esophagitis: Secondary | ICD-10-CM | POA: Diagnosis not present

## 2022-07-26 DIAGNOSIS — J90 Pleural effusion, not elsewhere classified: Secondary | ICD-10-CM | POA: Diagnosis not present

## 2022-07-26 DIAGNOSIS — Z85048 Personal history of other malignant neoplasm of rectum, rectosigmoid junction, and anus: Secondary | ICD-10-CM | POA: Diagnosis not present

## 2022-07-26 DIAGNOSIS — Z8719 Personal history of other diseases of the digestive system: Secondary | ICD-10-CM | POA: Diagnosis not present

## 2022-07-26 DIAGNOSIS — F419 Anxiety disorder, unspecified: Secondary | ICD-10-CM | POA: Diagnosis not present

## 2022-07-26 DIAGNOSIS — E785 Hyperlipidemia, unspecified: Secondary | ICD-10-CM | POA: Diagnosis not present

## 2022-07-26 DIAGNOSIS — Z932 Ileostomy status: Secondary | ICD-10-CM | POA: Diagnosis not present

## 2022-07-26 DIAGNOSIS — I4891 Unspecified atrial fibrillation: Secondary | ICD-10-CM | POA: Diagnosis not present

## 2022-07-26 DIAGNOSIS — I5020 Unspecified systolic (congestive) heart failure: Secondary | ICD-10-CM | POA: Diagnosis not present

## 2022-07-26 DIAGNOSIS — Z8679 Personal history of other diseases of the circulatory system: Secondary | ICD-10-CM | POA: Diagnosis not present

## 2022-07-26 DIAGNOSIS — I3139 Other pericardial effusion (noninflammatory): Secondary | ICD-10-CM | POA: Diagnosis not present

## 2022-07-26 DIAGNOSIS — I10 Essential (primary) hypertension: Secondary | ICD-10-CM | POA: Diagnosis not present

## 2022-07-26 DIAGNOSIS — D649 Anemia, unspecified: Secondary | ICD-10-CM | POA: Diagnosis not present

## 2022-07-26 DIAGNOSIS — J309 Allergic rhinitis, unspecified: Secondary | ICD-10-CM | POA: Diagnosis not present

## 2022-08-23 ENCOUNTER — Ambulatory Visit: Payer: Medicare Other | Admitting: Cardiovascular Disease

## 2022-08-26 DEATH — deceased

## 2022-10-04 ENCOUNTER — Ambulatory Visit: Payer: Medicare Other | Admitting: Cardiovascular Disease
